# Patient Record
Sex: Female | Born: 1975
Health system: Southern US, Community
[De-identification: ages and names within clinical notes are randomized; demographics above are authoritative.]

## PROBLEM LIST (undated history)

## (undated) DIAGNOSIS — J189 Pneumonia, unspecified organism: Secondary | ICD-10-CM

## (undated) DIAGNOSIS — R05 Cough: Secondary | ICD-10-CM

## (undated) DIAGNOSIS — E876 Hypokalemia: Secondary | ICD-10-CM

## (undated) DIAGNOSIS — L309 Dermatitis, unspecified: Secondary | ICD-10-CM

## (undated) DIAGNOSIS — R059 Cough, unspecified: Secondary | ICD-10-CM

## (undated) DIAGNOSIS — D649 Anemia, unspecified: Secondary | ICD-10-CM

## (undated) DIAGNOSIS — I499 Cardiac arrhythmia, unspecified: Secondary | ICD-10-CM

## (undated) DIAGNOSIS — E559 Vitamin D deficiency, unspecified: Secondary | ICD-10-CM

## (undated) DIAGNOSIS — R748 Abnormal levels of other serum enzymes: Secondary | ICD-10-CM

## (undated) DIAGNOSIS — I10 Essential (primary) hypertension: Secondary | ICD-10-CM

## (undated) DIAGNOSIS — E669 Obesity, unspecified: Secondary | ICD-10-CM

## (undated) DIAGNOSIS — E119 Type 2 diabetes mellitus without complications: Secondary | ICD-10-CM

## (undated) HISTORY — DX: Abnormal levels of other serum enzymes: R74.8

## (undated) HISTORY — DX: Cough, unspecified: R05.9

## (undated) HISTORY — DX: Vitamin D deficiency, unspecified: E55.9

## (undated) HISTORY — DX: Cough: R05

## (undated) HISTORY — DX: Hypokalemia: E87.6

## (undated) HISTORY — DX: Cardiac arrhythmia, unspecified: I49.9

## (undated) HISTORY — DX: Obesity, unspecified: E66.9

## (undated) HISTORY — DX: Anemia, unspecified: D64.9

## (undated) HISTORY — DX: Type 2 diabetes mellitus without complications: E11.9

## (undated) HISTORY — DX: Essential (primary) hypertension: I10

## (undated) HISTORY — DX: Pneumonia, unspecified organism: J18.9

## (undated) HISTORY — DX: Hypocalcemia: E83.51

---

## 2009-09-12 ENCOUNTER — Emergency Department (HOSPITAL_COMMUNITY): Admission: EM | Admit: 2009-09-12 | Discharge: 2009-09-12 | Payer: Self-pay | Admitting: Emergency Medicine

## 2018-12-26 ENCOUNTER — Encounter (HOSPITAL_COMMUNITY): Payer: Self-pay | Admitting: Internal Medicine

## 2018-12-26 ENCOUNTER — Other Ambulatory Visit: Payer: Self-pay

## 2018-12-26 ENCOUNTER — Emergency Department (HOSPITAL_COMMUNITY): Payer: Medicaid Other

## 2018-12-26 ENCOUNTER — Inpatient Hospital Stay (HOSPITAL_COMMUNITY)
Admission: EM | Admit: 2018-12-26 | Discharge: 2018-12-31 | DRG: 871 | Disposition: A | Discharge status: Home / Self Care | Payer: Medicaid Other | Attending: Family Medicine | Admitting: Family Medicine

## 2018-12-26 DIAGNOSIS — D72825 Bandemia: Secondary | ICD-10-CM

## 2018-12-26 DIAGNOSIS — E871 Hypo-osmolality and hyponatremia: Secondary | ICD-10-CM

## 2018-12-26 DIAGNOSIS — E8770 Fluid overload, unspecified: Secondary | ICD-10-CM | POA: Diagnosis present

## 2018-12-26 DIAGNOSIS — J189 Pneumonia, unspecified organism: Secondary | ICD-10-CM | POA: Diagnosis present

## 2018-12-26 DIAGNOSIS — E876 Hypokalemia: Secondary | ICD-10-CM | POA: Diagnosis present

## 2018-12-26 DIAGNOSIS — A419 Sepsis, unspecified organism: Principal | ICD-10-CM | POA: Diagnosis present

## 2018-12-26 DIAGNOSIS — J181 Lobar pneumonia, unspecified organism: Secondary | ICD-10-CM

## 2018-12-26 DIAGNOSIS — D72829 Elevated white blood cell count, unspecified: Secondary | ICD-10-CM | POA: Diagnosis present

## 2018-12-26 DIAGNOSIS — Z20828 Contact with and (suspected) exposure to other viral communicable diseases: Secondary | ICD-10-CM | POA: Diagnosis present

## 2018-12-26 DIAGNOSIS — D649 Anemia, unspecified: Secondary | ICD-10-CM | POA: Diagnosis present

## 2018-12-26 DIAGNOSIS — J90 Pleural effusion, not elsewhere classified: Secondary | ICD-10-CM

## 2018-12-26 DIAGNOSIS — E860 Dehydration: Secondary | ICD-10-CM | POA: Diagnosis present

## 2018-12-26 DIAGNOSIS — R0682 Tachypnea, not elsewhere classified: Secondary | ICD-10-CM | POA: Diagnosis present

## 2018-12-26 LAB — CBC WITH DIFFERENTIAL/PLATELET
Abs Immature Granulocytes: 0.1 10*3/uL — ABNORMAL HIGH (ref 0.00–0.07)
Basophils Absolute: 0 10*3/uL (ref 0.0–0.1)
Basophils Relative: 0 %
Eosinophils Absolute: 0.1 10*3/uL (ref 0.0–0.5)
Eosinophils Relative: 1 %
HCT: 35 % — ABNORMAL LOW (ref 36.0–46.0)
Hemoglobin: 11.1 g/dL — ABNORMAL LOW (ref 12.0–15.0)
Immature Granulocytes: 1 %
Lymphocytes Relative: 11 %
Lymphs Abs: 1.3 10*3/uL (ref 0.7–4.0)
MCH: 26.7 pg (ref 26.0–34.0)
MCHC: 31.7 g/dL (ref 30.0–36.0)
MCV: 84.3 fL (ref 80.0–100.0)
Monocytes Absolute: 1.7 10*3/uL — ABNORMAL HIGH (ref 0.1–1.0)
Monocytes Relative: 15 %
Neutro Abs: 8.4 10*3/uL — ABNORMAL HIGH (ref 1.7–7.7)
Neutrophils Relative %: 72 %
Platelets: 325 10*3/uL (ref 150–400)
RBC: 4.15 MIL/uL (ref 3.87–5.11)
RDW: 12.3 % (ref 11.5–15.5)
WBC: 11.6 10*3/uL — ABNORMAL HIGH (ref 4.0–10.5)
nRBC: 0 % (ref 0.0–0.2)

## 2018-12-26 LAB — COMPREHENSIVE METABOLIC PANEL
ALT: 78 U/L — ABNORMAL HIGH (ref 0–44)
AST: 56 U/L — ABNORMAL HIGH (ref 15–41)
Albumin: 3.5 g/dL (ref 3.5–5.0)
Alkaline Phosphatase: 94 U/L (ref 38–126)
Anion gap: 10 (ref 5–15)
BUN: 7 mg/dL (ref 6–20)
CO2: 24 mmol/L (ref 22–32)
Calcium: 8.3 mg/dL — ABNORMAL LOW (ref 8.9–10.3)
Chloride: 99 mmol/L (ref 98–111)
Creatinine, Ser: 0.77 mg/dL (ref 0.44–1.00)
GFR calc Af Amer: >60 mL/min (ref >60)
GFR calc non Af Amer: >60 mL/min (ref >60)
Glucose, Bld: 136 mg/dL — ABNORMAL HIGH (ref 70–99)
Potassium: 3.4 mmol/L — ABNORMAL LOW (ref 3.5–5.1)
Sodium: 133 mmol/L — ABNORMAL LOW (ref 135–145)
Total Bilirubin: 4 mg/dL — ABNORMAL HIGH (ref 0.3–1.2)
Total Protein: 8.1 g/dL (ref 6.5–8.1)

## 2018-12-26 LAB — LACTIC ACID, PLASMA
Lactic Acid, Venous: 1.1 mmol/L (ref 0.5–1.9)
Lactic Acid, Venous: 1.4 mmol/L (ref 0.5–1.9)

## 2018-12-26 LAB — HCG, QUANTITATIVE, PREGNANCY: hCG, Beta Chain, Quant, S: <5 m[IU]/mL (ref <5)

## 2018-12-26 LAB — I-STAT BETA HCG BLOOD, ED (MC, WL, AP ONLY): I-stat hCG, quantitative: 12 m[IU]/mL — ABNORMAL HIGH (ref <5)

## 2018-12-26 LAB — SARS CORONAVIRUS 2 BY RT PCR (HOSPITAL ORDER, PERFORMED IN ~~LOC~~ HOSPITAL LAB): SARS Coronavirus 2: NEGATIVE

## 2018-12-26 MED ORDER — SODIUM CHLORIDE 0.9 % IV SOLN
500.0000 mg | INTRAVENOUS | Status: DC
  Administered 2018-12-27 – 2018-12-29 (×3): 500 mg via INTRAVENOUS
  Filled 2018-12-26 (×4): qty 500

## 2018-12-26 MED ORDER — SODIUM CHLORIDE 0.9 % IV SOLN
INTRAVENOUS | Status: DC
  Administered 2018-12-26 – 2018-12-28 (×3): via INTRAVENOUS

## 2018-12-26 MED ORDER — SODIUM CHLORIDE 0.9 % IV SOLN
500.0000 mg | Freq: Once | INTRAVENOUS | Status: AC
  Administered 2018-12-26: 500 mg via INTRAVENOUS
  Filled 2018-12-26: qty 500

## 2018-12-26 MED ORDER — ACETAMINOPHEN 500 MG PO TABS
1000.0000 mg | ORAL_TABLET | Freq: Once | ORAL | Status: AC
  Administered 2018-12-26: 1000 mg via ORAL
  Filled 2018-12-26: qty 2

## 2018-12-26 MED ORDER — ALBUTEROL SULFATE HFA 108 (90 BASE) MCG/ACT IN AERS
8.0000 | INHALATION_SPRAY | Freq: Once | RESPIRATORY_TRACT | Status: AC
  Administered 2018-12-26: 8 via RESPIRATORY_TRACT
  Filled 2018-12-26: qty 6.7

## 2018-12-26 MED ORDER — SODIUM CHLORIDE 0.9 % IV BOLUS
1000.0000 mL | Freq: Once | INTRAVENOUS | Status: AC
  Administered 2018-12-26: 14:00:00 1000 mL via INTRAVENOUS

## 2018-12-26 MED ORDER — ACETAMINOPHEN 500 MG PO TABS
1000.0000 mg | ORAL_TABLET | Freq: Four times a day (QID) | ORAL | Status: DC | PRN
  Administered 2018-12-26 – 2018-12-30 (×7): 1000 mg via ORAL
  Filled 2018-12-26 (×8): qty 2

## 2018-12-26 MED ORDER — BENZONATATE 100 MG PO CAPS
100.0000 mg | ORAL_CAPSULE | Freq: Three times a day (TID) | ORAL | Status: DC | PRN
  Administered 2018-12-26 – 2018-12-27 (×2): 100 mg via ORAL
  Filled 2018-12-26 (×2): qty 1

## 2018-12-26 MED ORDER — SODIUM CHLORIDE 0.9 % IV BOLUS
1000.0000 mL | Freq: Once | INTRAVENOUS | Status: AC
  Administered 2018-12-26: 12:00:00 1000 mL via INTRAVENOUS

## 2018-12-26 MED ORDER — SODIUM CHLORIDE 0.9 % IV SOLN
1.0000 g | INTRAVENOUS | Status: DC
  Administered 2018-12-27 – 2018-12-28 (×2): 1 g via INTRAVENOUS
  Filled 2018-12-26 (×2): qty 1

## 2018-12-26 MED ORDER — HYDRALAZINE HCL 20 MG/ML IJ SOLN
10.0000 mg | Freq: Four times a day (QID) | INTRAMUSCULAR | Status: DC | PRN
  Administered 2018-12-27: 10 mg via INTRAVENOUS
  Filled 2018-12-26 (×2): qty 1

## 2018-12-26 MED ORDER — ENOXAPARIN SODIUM 40 MG/0.4ML ~~LOC~~ SOLN
40.0000 mg | SUBCUTANEOUS | Status: DC
  Administered 2018-12-27 – 2018-12-29 (×3): 40 mg via SUBCUTANEOUS
  Filled 2018-12-26 (×4): qty 0.4

## 2018-12-26 MED ORDER — SODIUM CHLORIDE 0.9 % IV SOLN
2.0000 g | Freq: Once | INTRAVENOUS | Status: AC
  Administered 2018-12-26: 2 g via INTRAVENOUS
  Filled 2018-12-26: qty 20

## 2018-12-26 MED ORDER — DM-GUAIFENESIN ER 30-600 MG PO TB12
1.0000 | ORAL_TABLET | Freq: Two times a day (BID) | ORAL | Status: DC | PRN
  Administered 2018-12-26 – 2018-12-27 (×2): 1 via ORAL
  Filled 2018-12-26 (×2): qty 1

## 2018-12-26 NOTE — ED Provider Notes (Signed)
[facility]-EMERGENCY DEPT Provider Note   CSN: [id number] Arrival date & time: 12/26/18  1126    History   Chief Complaint Chief Complaint  Patient presents with  . Shortness of Breath  . Cough    HPI Valerie Moss is a 43 y.o. female.     HPI   43 yo F with PMHx below here with cough, fever, fatigue. Pt states that over there past week, she's had progressively worsening cough, fatigue, and weakness. She works as a Surveyor, [employer] and has continued to work during hte pandemic. Her sx started approx 6 days ago with mild cough followed by high fever, chills, mild nausea. Over the past two days, she's now developed SOB and difficulty walking 2/2 SOB. She's had mild production of sputum. Fevers have been as high as 103. She went to UC yesterday and was given tessalon, has been taking it w/o marked relief. No rash. No abd pain. No specific alleviating factors.  No past medical history on file.   PMHX: None, denies h/o asthma or COPD  There are no active problems to display for this patient.  PSHx: Non contributory   Non-smoker, no other drugs. No recent travel.   OB History   No obstetric history on file.      Home Medications    Prior to Admission medications   Medication Sig Start Date End Date Taking? Authorizing Provider  acetaminophen (TYLENOL) 500 MG tablet Take 1,000 mg by mouth every 6 (six) hours as needed for mild pain.   Yes [provider]  benzonatate (TESSALON) 100 MG capsule Take 100 mg by mouth 3 (three) times daily as needed for cough.   Yes [provider]  dextromethorphan-guaiFENesin (MUCINEX DM) 30-600 MG 12hr tablet Take 1 tablet by mouth 2 (two) times daily as needed for cough.   Yes [provider]    Family History No family history on file.  Social History Social History   Tobacco Use  . Smoking status: Not on file  Substance Use Topics  . Alcohol use: Not on file  . Drug use: Not on file     Allergies   Patient has no known allergies.   Review of Systems Review of Systems  Constitutional: Positive for chills, fatigue and fever.  HENT: Positive for congestion. Negative for rhinorrhea.   Eyes: Negative for visual disturbance.  Respiratory: Positive for cough and shortness of breath. Negative for wheezing.   Cardiovascular: Negative for chest pain and leg swelling.  Gastrointestinal: Negative for abdominal pain, diarrhea, nausea and vomiting.  Genitourinary: Negative for dysuria and flank pain.  Musculoskeletal: Negative for neck pain and neck stiffness.  Skin: Negative for rash and wound.  Allergic/Immunologic: Negative for immunocompromised state.  Neurological: Positive for weakness. Negative for syncope and headaches.  All other systems reviewed and are negative.    Physical Exam Updated Vital Signs BP (!) 147/110   Pulse (!) 102   Temp (!) 103.2 F (39.6 C) (Oral)   Resp (!) 30   Ht 5\' 7"  (1.702 m)   Wt 128.8 kg   LMP 12/25/2018 (Approximate) Comment: IUD  SpO2 95%   BMI 44.48 kg/m   Physical Exam Vitals signs and nursing note reviewed.  Constitutional:      General: She is not in acute distress.    Appearance: She is well-developed. She is ill-appearing.  HENT:     Head: Normocephalic and atraumatic.     Comments: Dry MM Eyes:  Conjunctiva/sclera: Conjunctivae normal.  Neck:     Musculoskeletal: Neck supple.  Cardiovascular:     Rate and Rhythm: Regular rhythm. Tachycardia present.     Heart sounds: Normal heart sounds. No murmur. No friction rub.  Pulmonary:     Effort: Pulmonary effort is normal. No respiratory distress.     Breath sounds: Wheezing and rhonchi present. No rales.  Abdominal:     General: There is no distension.     Palpations: Abdomen is soft.     Tenderness: There is no abdominal tenderness.  Skin:    General: Skin is warm.     Capillary Refill: Capillary refill takes less than 2 seconds.  Neurological:     Mental  Status: She is alert and oriented to person, place, and time.     Motor: No abnormal muscle tone.      ED Treatments / Results  Labs (all labs ordered are listed, but only abnormal results are displayed) Labs Reviewed  CBC WITH DIFFERENTIAL/PLATELET - Abnormal; Notable for the following components:      Result Value   WBC 11.6 (*)    Hemoglobin 11.1 (*)    HCT 35.0 (*)    Neutro Abs 8.4 (*)    Monocytes Absolute 1.7 (*)    Abs Immature Granulocytes 0.10 (*)    All other components within normal limits  COMPREHENSIVE METABOLIC PANEL - Abnormal; Notable for the following components:   Sodium 133 (*)    Potassium 3.4 (*)    Glucose, Bld 136 (*)    Calcium 8.3 (*)    AST 56 (*)    ALT 78 (*)    Total Bilirubin 4.0 (*)    All other components within normal limits  I-STAT BETA HCG BLOOD, ED ([facility], [facility], [facility] ONLY) - Abnormal; Notable for the following components:   I-stat hCG, quantitative 12.0 (*)    All other components within normal limits  SARS CORONAVIRUS 2 (HOSPITAL ORDER, PERFORMED IN [facility])  CULTURE, BLOOD (ROUTINE X 2)  CULTURE, BLOOD (ROUTINE X 2)  LACTIC ACID, PLASMA  HCG, QUANTITATIVE, PREGNANCY  LACTIC ACID, PLASMA    EKG EKG Interpretation  Date/Time:  Saturday December 26 2018 11:52:06 EDT Ventricular Rate:  128 PR Interval:    QRS Duration: 76 QT Interval:  290 QTC Calculation: 424 R Axis:   24 Text Interpretation:  Sinus tachycardia Anterior infarct, old Baseline wander in lead(s) II III aVF No old tracing to compare Confirmed by Shaune Pollack [phone]) on 12/26/2018 1:14:40 PM   Radiology Dg Chest Portable 1 View  Result Date: 12/26/2018 CLINICAL DATA:  Shortness of breath.  Cough for 6 days. EXAM: PORTABLE CHEST 1 VIEW COMPARISON:  None. FINDINGS: The study is limited due to low volume portable technique. No pneumothorax. Mild opacity in the medial right lung base. Lungs are otherwise clear. Suspected cardiomegaly, poorly evaluated due to  technique. No other acute abnormalities. IMPRESSION: 1. The study is limited due to low volume portable technique. Mild opacity in the medial right lung base is not excluded. Recommend a better volume PA and lateral chest x-ray. 2. No other acute abnormalities. Electronically Signed   By: Gerome Sam III M.D   On: 12/26/2018 12:49    Procedures .Critical Care Performed by: Shaune Pollack, MD Authorized by: Shaune Pollack, MD   Critical care provider statement:    Critical care time (minutes):  35   Critical care time was exclusive of:  Separately billable procedures and treating other  patients and teaching time   Critical care was necessary to treat or prevent imminent or life-threatening deterioration of the following conditions:  Cardiac failure, circulatory failure and respiratory failure   Critical care was time spent personally by me on the following activities:  Development of treatment plan with patient or surrogate, discussions with consultants, evaluation of patient's response to treatment, examination of patient, obtaining history from patient or surrogate, ordering and performing treatments and interventions, ordering and review of laboratory studies, ordering and review of radiographic studies, pulse oximetry, re-evaluation of patient's condition and review of old charts   I assumed direction of critical care for this patient from another provider in my specialty: no     (including critical care time)  Medications Ordered in ED Medications  acetaminophen (TYLENOL) tablet 1,000 mg (1,000 mg Oral Given 12/26/18 1202)  sodium chloride 0.9 % bolus 1,000 mL (0 mLs Intravenous Stopped 12/26/18 1341)  albuterol (VENTOLIN HFA) 108 (90 Base) MCG/ACT inhaler 8 puff (8 puffs Inhalation Given 12/26/18 1203)  sodium chloride 0.9 % bolus 1,000 mL (1,000 mLs Intravenous New Bag/Given 12/26/18 1358)  cefTRIAXone (ROCEPHIN) 2 g in sodium chloride 0.9 % 100 mL IVPB (2 g Intravenous New Bag/Given  12/26/18 1358)  azithromycin (ZITHROMAX) 500 mg in sodium chloride 0.9 % 250 mL IVPB (500 mg Intravenous New Bag/Given 12/26/18 1400)     Initial Impression / Assessment and Plan / ED Course  I have reviewed the triage vital signs and the nursing notes.  Pertinent labs & imaging results that were available during my care of the patient were reviewed by me and considered in my medical decision making (see chart for details).        43 yo F here with increased WOB, cough, SOB. On exam, pt febrile, tachycardic, tachypneic with increased WOB. CXR is concerning for PNA and labs show leukocytosis, mild transaminitis which I suspect is viral in etiology. Interestingly, COVID negative though I still have a high suspicion for this. Pt given fluids, APAP, Rocephin/Azithro and will admit.  Final Clinical Impressions(s) / ED Diagnoses   Final diagnoses:  Sepsis due to pneumonia [facility])    ED Discharge Orders    None       Shaune Pollack, MD 12/26/18 1514

## 2018-12-26 NOTE — H&P (Signed)
History and Physical   Valerie RedRachel Geddes BMW:[id number] DOB: 04-02-1976 DOA: 12/26/2018  Referring MD/NP/PA: Dr. Erma HeritageIsaacs  PCP: Patient, No Pcp Per   Outpatient Specialists: None  Patient coming from: Home  Chief Complaint: Fever cough or shortness of breath  HPI: Valerie Moss is a 43 y.o. female with medical history significant of no significant past medical history who works at [employer] and has had possible sick contacts presenting with shortness of breath cough and fever.  Symptoms have been going on for within the past week.  Associated with malaise.  Patient was seen in the ER with a temperature 103.  She has had some mild nausea but no vomiting.  She has had some productive cough.  Denied any diarrhea.  Patient was seen in the ER evaluated.  Rapid test for the COVID-19 was negative.  She is has clinical concern however for the virus including symptoms.  No leukopenia nor thrombocytopenia.  At this point she is considered low risk for COVID-19 but chest x-ray showed pneumonia.  Patient is being admitted for treatment of community-acquired pneumonia with low risk COVID-19 infection..  ED Course: Temperature 103.2 blood pressure 172/108 pulse 136, respiratory of 33 oxygen sat 93% on room air.  White count is 11.6 hemoglobin 11.1 platelets 325.  Sodium 133 potassium 3.4 chloride 99 CO2 24 with a BUN 7 creatinine 0.77.  Calcium is 8.3.  Glucose is 136 and lactic acid 1.1.  Chest x-ray showed right basal infiltrate.  EKG shows sinus tach.  Patient is being admitted with pneumonia again rapid COVID-19 testing was negative.  Review of Systems: As per HPI otherwise 10 point review of systems negative.    History reviewed. No pertinent past medical history.  History reviewed. No pertinent surgical history.   has no history on file for tobacco, alcohol, and drug.  No Known Allergies  History reviewed. No pertinent family history.   Prior to Admission medications   Medication Sig Start  Date End Date Taking? Authorizing Provider  acetaminophen (TYLENOL) 500 MG tablet Take 1,000 mg by mouth every 6 (six) hours as needed for mild pain.   Yes [provider]  benzonatate (TESSALON) 100 MG capsule Take 100 mg by mouth 3 (three) times daily as needed for cough.   Yes [provider]  dextromethorphan-guaiFENesin (MUCINEX DM) 30-600 MG 12hr tablet Take 1 tablet by mouth 2 (two) times daily as needed for cough.   Yes [provider]    Physical Exam: Vitals:   12/26/18 1515 12/26/18 1530 12/26/18 1600 12/26/18 1625  BP:  (!) 165/152 (!) 172/108   Pulse: 100 (!) 101 (!) 106   Resp: (!) 33 (!) 24 (!) 25   Temp:    (!) 102 F (38.9 C)  TempSrc:    Oral  SpO2: 95% 98% 99%   Weight:      Height:          Constitutional: NAD, calm, acutely ill looking Vitals:   12/26/18 1515 12/26/18 1530 12/26/18 1600 12/26/18 1625  BP:  (!) 165/152 (!) 172/108   Pulse: 100 (!) 101 (!) 106   Resp: (!) 33 (!) 24 (!) 25   Temp:    (!) 102 F (38.9 C)  TempSrc:    Oral  SpO2: 95% 98% 99%   Weight:      Height:       Eyes: PERRL, lids and conjunctivae normal ENMT: Mucous membranes are moist. Posterior pharynx clear of any exudate or lesions.Normal dentition.  Neck: normal, supple, no masses, no thyromegaly Respiratory: Decreased air entry bilaterally, no wheezing, bilateral basal crackles.  Increased respiratory effort. No accessory muscle use.  Cardiovascular: Sinus tachycardia no murmurs / rubs / gallops. No extremity edema. 2+ pedal pulses. No carotid bruits.  Abdomen: no tenderness, no masses palpated. No hepatosplenomegaly. Bowel sounds positive.  Musculoskeletal: no clubbing / cyanosis. No joint deformity upper and lower extremities. Good ROM, no contractures. Normal muscle tone.  Skin: no rashes, lesions, ulcers. No induration Neurologic: CN 2-12 grossly intact. Sensation intact, DTR normal. Strength 5/5 in all 4.  Psychiatric: Normal judgment and  insight. Alert and oriented x 3. Normal mood.     Labs on Admission: I have personally reviewed following labs and imaging studies  CBC: Recent Labs  Lab 12/26/18 1154  WBC 11.6*  NEUTROABS 8.4*  HGB 11.1*  HCT 35.0*  MCV 84.3  PLT 325   Basic Metabolic Panel: Recent Labs  Lab 12/26/18 1154  NA 133*  K 3.4*  CL 99  CO2 24  GLUCOSE 136*  BUN 7  CREATININE 0.77  CALCIUM 8.3*   GFR: Estimated Creatinine Clearance: 129.3 mL/min (by C-G formula based on SCr of 0.77 mg/dL). Liver Function Tests: Recent Labs  Lab 12/26/18 1154  AST 56*  ALT 78*  ALKPHOS 94  BILITOT 4.0*  PROT 8.1  ALBUMIN 3.5   No results for input(s): LIPASE, AMYLASE in the last 168 hours. No results for input(s): AMMONIA in the last 168 hours. Coagulation Profile: No results for input(s): INR, PROTIME in the last 168 hours. Cardiac Enzymes: No results for input(s): CKTOTAL, CKMB, CKMBINDEX, TROPONINI in the last 168 hours. BNP (last 3 results) No results for input(s): PROBNP in the last 8760 hours. HbA1C: No results for input(s): HGBA1C in the last 72 hours. CBG: No results for input(s): GLUCAP in the last 168 hours. Lipid Profile: No results for input(s): CHOL, HDL, LDLCALC, TRIG, CHOLHDL, LDLDIRECT in the last 72 hours. Thyroid Function Tests: No results for input(s): TSH, T4TOTAL, FREET4, T3FREE, THYROIDAB in the last 72 hours. Anemia Panel: No results for input(s): VITAMINB12, FOLATE, FERRITIN, TIBC, IRON, RETICCTPCT in the last 72 hours. Urine analysis: No results found for: COLORURINE, APPEARANCEUR, LABSPEC, PHURINE, GLUCOSEU, HGBUR, BILIRUBINUR, KETONESUR, PROTEINUR, UROBILINOGEN, NITRITE, LEUKOCYTESUR Sepsis Labs: (procalcitonin:4,lacticidven:4) ) Recent Results (from the past 240 hour(s))  SARS Coronavirus 2 [facility] order, Performed in [facility] hospital lab)     Status: None   Collection Time: 12/26/18 12:46 PM  Result Value Ref Range Status   SARS Coronavirus 2  NEGATIVE NEGATIVE Final    Comment: (NOTE) If result is NEGATIVE SARS-CoV-2 target nucleic acids are NOT DETECTED. The SARS-CoV-2 RNA is generally detectable in upper and lower  respiratory specimens during the acute phase of infection. The lowest  concentration of SARS-CoV-2 viral copies this assay can detect is 250  copies / mL. A negative result does not preclude SARS-CoV-2 infection  and should not be used as the sole basis for treatment or other  patient management decisions.  A negative result may occur with  improper specimen collection / handling, submission of specimen other  than nasopharyngeal swab, presence of viral mutation(s) within the  areas targeted by this assay, and inadequate number of viral copies  (<250 copies / mL). A negative result must be combined with clinical  observations, patient history, and epidemiological information. If result is POSITIVE SARS-CoV-2 target nucleic acids are DETECTED. The SARS-CoV-2 RNA is generally detectable in upper and lower  respiratory  specimens dur ing the acute phase of infection.  Positive  results are indicative of active infection with SARS-CoV-2.  Clinical  correlation with patient history and other diagnostic information is  necessary to determine patient infection status.  Positive results do  not rule out bacterial infection or co-infection with other viruses. If result is PRESUMPTIVE POSTIVE SARS-CoV-2 nucleic acids MAY BE PRESENT.   A presumptive positive result was obtained on the submitted specimen  and confirmed on repeat testing.  While 2019 novel coronavirus  (SARS-CoV-2) nucleic acids may be present in the submitted sample  additional confirmatory testing may be necessary for epidemiological  and / or clinical management purposes  to differentiate between  SARS-CoV-2 and other Sarbecovirus currently known to infect humans.  If clinically indicated additional testing with an alternate test  methodology [phone])  is advised. The SARS-CoV-2 RNA is generally  detectable in upper and lower respiratory sp ecimens during the acute  phase of infection. The expected result is Negative. Fact Sheet for Patients:  BoilerBrush.com.cy Fact Sheet for Healthcare Providers: https://pope.com/ This test is not yet approved or cleared by the [place] FDA and has been authorized for detection and/or diagnosis of SARS-CoV-2 by FDA under an Emergency Use Authorization (EUA).  This EUA will remain in effect (meaning this test can be used) for the duration of the COVID-19 declaration under Section 564(b)(1) of the Act, 21 U.S.C. section 360bbb-3(b)(1), unless the authorization is terminated or revoked sooner. Performed at [facility], [address], [place], [place] [address]      Radiological Exams on Admission: Dg Chest Portable 1 View  Result Date: 12/26/2018 CLINICAL DATA:  Shortness of breath.  Cough for 6 days. EXAM: PORTABLE CHEST 1 VIEW COMPARISON:  None. FINDINGS: The study is limited due to low volume portable technique. No pneumothorax. Mild opacity in the medial right lung base. Lungs are otherwise clear. Suspected cardiomegaly, poorly evaluated due to technique. No other acute abnormalities. IMPRESSION: 1. The study is limited due to low volume portable technique. Mild opacity in the medial right lung base is not excluded. Recommend a better volume PA and lateral chest x-ray. 2. No other acute abnormalities. Electronically Signed   By: Gerome Sam III M.D   On: 12/26/2018 12:49    EKG: Independently reviewed.  It shows sinus tachycardia with a rate of 120s no significant ST changes.  Assessment/Plan Principal Problem:   Pneumonia Active Problems:   Tachypnea   Leucocytosis   Hypokalemia   Hyponatremia     #1 community-acquired pneumonia: Patient is going to be admitted for committee acquired pneumonia.  Initiate IV  Rocephin and Zithromax.  Monitor blood culture and sputum culture results.  Patient will have a repeat COVID-19 testing tomorrow.  Have discussed with infectious disease.  The test is pretty sensitive and is negative however clinically she is 16 the profile for possible COVID-19.  Recommendation is to repeat the test 24 hours later at least.  We will check it again in the morning.  Patient is low risk at this point.  #2 tachypnea: Most likely due to pneumonia.  Continue supportive care.  #3 hyponatremia: Continue normal saline resuscitation.  #4 hypokalemia: Continue potassium dilatation.  #5 leukocytosis: Secondary to pneumonia.  Monitor white count.  #6 morbid obesity: Dietary counseling.   DVT prophylaxis: Lovenox Code Status: Full code Family Communication: No family at bedside discussed with patient Disposition Plan: Home Consults called: None Admission status: Inpatient   Severity of Illness: The appropriate  patient status for this patient is INPATIENT. Inpatient status is judged to be reasonable and necessary in order to provide the required intensity of service to ensure the patient's safety. The patient's presenting symptoms, physical exam findings, and initial radiographic and laboratory data in the context of their chronic comorbidities is felt to place them at high risk for further clinical deterioration. Furthermore, it is not anticipated that the patient will be medically stable for discharge from the hospital within 2 midnights of admission. The following factors support the patient status of inpatient.   " The patient's presenting symptoms include fever cough or shortness of breath. " The worrisome physical exam findings include tachypnea with decreased air entry bilaterally. " The initial radiographic and laboratory data are worrisome because of chest x-ray showing right basilar infiltrate. " The chronic co-morbidities include no significant past medical history.   * I  certify that at the point of admission it is my clinical judgment that the patient will require inpatient hospital care spanning beyond 2 midnights from the point of admission due to high intensity of service, high risk for further deterioration and high frequency of surveillance required.Lonia Blood MD [facility] Pager [phone]  If 7PM-7AM, please contact night-coverage [email or web address] Password [facility]  12/26/2018, 4:54 PM

## 2018-12-26 NOTE — ED Triage Notes (Signed)
Pt BIBA from home with c/o SHOB, cough x 6 days. Pt having fever at UC (103.0 F).  C/o neck pain from coughing, headache as well 7/10.   Denies abd pain.  C/o nausea on arrival to ED.    AOx4.

## 2018-12-26 NOTE — ED Notes (Signed)
Bed: MG86 Expected date:  Expected time:  Means of arrival:  Comments: 43 yo cough, SOB, fever

## 2018-12-27 ENCOUNTER — Inpatient Hospital Stay (HOSPITAL_COMMUNITY): Payer: Medicaid Other

## 2018-12-27 DIAGNOSIS — J189 Pneumonia, unspecified organism: Secondary | ICD-10-CM

## 2018-12-27 LAB — COMPREHENSIVE METABOLIC PANEL
ALT: 69 U/L — ABNORMAL HIGH (ref 0–44)
AST: 55 U/L — ABNORMAL HIGH (ref 15–41)
Albumin: 2.9 g/dL — ABNORMAL LOW (ref 3.5–5.0)
Alkaline Phosphatase: 87 U/L (ref 38–126)
Anion gap: 10 (ref 5–15)
BUN: 6 mg/dL (ref 6–20)
CO2: 22 mmol/L (ref 22–32)
Calcium: 7.6 mg/dL — ABNORMAL LOW (ref 8.9–10.3)
Chloride: 104 mmol/L (ref 98–111)
Creatinine, Ser: 0.66 mg/dL (ref 0.44–1.00)
GFR calc Af Amer: >60 mL/min (ref >60)
GFR calc non Af Amer: >60 mL/min (ref >60)
Glucose, Bld: 141 mg/dL — ABNORMAL HIGH (ref 70–99)
Potassium: 3.3 mmol/L — ABNORMAL LOW (ref 3.5–5.1)
Sodium: 136 mmol/L (ref 135–145)
Total Bilirubin: 3.2 mg/dL — ABNORMAL HIGH (ref 0.3–1.2)
Total Protein: 6.9 g/dL (ref 6.5–8.1)

## 2018-12-27 LAB — CBC WITH DIFFERENTIAL/PLATELET
Abs Immature Granulocytes: 0.08 10*3/uL — ABNORMAL HIGH (ref 0.00–0.07)
Basophils Absolute: 0 10*3/uL (ref 0.0–0.1)
Basophils Relative: 0 %
Eosinophils Absolute: 0 10*3/uL (ref 0.0–0.5)
Eosinophils Relative: 0 %
HCT: 34.6 % — ABNORMAL LOW (ref 36.0–46.0)
Hemoglobin: 10.4 g/dL — ABNORMAL LOW (ref 12.0–15.0)
Immature Granulocytes: 1 %
Lymphocytes Relative: 12 %
Lymphs Abs: 1.3 10*3/uL (ref 0.7–4.0)
MCH: 26.2 pg (ref 26.0–34.0)
MCHC: 30.1 g/dL (ref 30.0–36.0)
MCV: 87.2 fL (ref 80.0–100.0)
Monocytes Absolute: 1.5 10*3/uL — ABNORMAL HIGH (ref 0.1–1.0)
Monocytes Relative: 14 %
Neutro Abs: 7.9 10*3/uL — ABNORMAL HIGH (ref 1.7–7.7)
Neutrophils Relative %: 73 %
Platelets: 291 10*3/uL (ref 150–400)
RBC: 3.97 MIL/uL (ref 3.87–5.11)
RDW: 12.8 % (ref 11.5–15.5)
WBC: 10.7 10*3/uL — ABNORMAL HIGH (ref 4.0–10.5)
nRBC: 0 % (ref 0.0–0.2)

## 2018-12-27 LAB — HIV ANTIBODY (ROUTINE TESTING W REFLEX): HIV Screen 4th Generation wRfx: NONREACTIVE

## 2018-12-27 LAB — SARS CORONAVIRUS 2 BY RT PCR (HOSPITAL ORDER, PERFORMED IN ~~LOC~~ HOSPITAL LAB): SARS Coronavirus 2: NEGATIVE

## 2018-12-27 MED ORDER — POTASSIUM CHLORIDE CRYS ER 20 MEQ PO TBCR
40.0000 meq | EXTENDED_RELEASE_TABLET | Freq: Once | ORAL | Status: AC
  Administered 2018-12-27: 40 meq via ORAL
  Filled 2018-12-27: qty 2

## 2018-12-27 MED ORDER — ZOLPIDEM TARTRATE 5 MG PO TABS
5.0000 mg | ORAL_TABLET | Freq: Once | ORAL | Status: AC
  Administered 2018-12-27: 5 mg via ORAL
  Filled 2018-12-27: qty 1

## 2018-12-27 MED ORDER — DM-GUAIFENESIN ER 30-600 MG PO TB12
1.0000 | ORAL_TABLET | Freq: Two times a day (BID) | ORAL | Status: DC | PRN
  Administered 2018-12-28: 1 via ORAL
  Filled 2018-12-27: qty 1

## 2018-12-27 MED ORDER — SALINE SPRAY 0.65 % NA SOLN
1.0000 | NASAL | Status: DC | PRN
  Filled 2018-12-27: qty 44

## 2018-12-27 MED ORDER — BENZONATATE 100 MG PO CAPS
100.0000 mg | ORAL_CAPSULE | Freq: Three times a day (TID) | ORAL | Status: DC | PRN
  Administered 2018-12-27 – 2018-12-28 (×3): 100 mg via ORAL
  Filled 2018-12-27 (×3): qty 1

## 2018-12-27 NOTE — Progress Notes (Signed)
[facility]  PROGRESS NOTE  Valerie Moss MVH:[id number] DOB: 23-Nov-1975 DOA: 12/26/2018 PCP: Patient, No Pcp Per   Brief HPI:   43 year old female with no significant medical problems who works at [employer] came to ED with shortness of breath, cough and fever.  COVID-19 test was negative in the ED, chest x-ray showed pneumonia.  Repeat COVID-19 test today also turned out to be negative.    Subjective   Patient seen and examined, continues to have cough and yellow phlegm.   Assessment/Plan:     1. Community-acquired pneumonia-patient admitted with community-acquired pneumonia, started on Rocephin and Zithromax.  COVID-19 test is negative.  Lactic acid 1.1, strep pneumo urinary antigen is pending, sputum culture is pending.  Will repeat chest x-ray two-view, as yesterday chest x-ray was portable with poor film quality.  Radiologist recommended to repeat 2 view.  Continue contact and droplet precautions.  2. Mild hyponatremia-resolved, patient presented with mild hyponatremia with sodium 133, improved to  136 with IV fluids.  Likely from dehydration and poor p.o. intake.  Pneumonia itself can cause SIADH.  Will closely monitor patient's serum sodium in the hospital.  3. Hypokalemia-potassium is 3.3, will replace potassium and check BMP in a.m.      CBC: Recent Labs  Lab 12/26/18 1154 12/27/18 0346  WBC 11.6* 10.7*  NEUTROABS 8.4* 7.9*  HGB 11.1* 10.4*  HCT 35.0* 34.6*  MCV 84.3 87.2  PLT 325 291    Basic Metabolic Panel: Recent Labs  Lab 12/26/18 1154 12/27/18 0346  NA 133* 136  K 3.4* 3.3*  CL 99 104  CO2 24 22  GLUCOSE 136* 141*  BUN 7 6  CREATININE 0.77 0.66  CALCIUM 8.3* 7.6*     DVT prophylaxis: Lovenox  Code Status: Full code  Family Communication: No family at bedside  Disposition Plan: likely home when medically ready for discharge     Consultants:    Procedures:     Antibiotics:   Anti-infectives (From admission, onward)    Start     Dose/Rate Route Frequency Ordered Stop   12/27/18 1000  cefTRIAXone (ROCEPHIN) 1 g in sodium chloride 0.9 % 100 mL IVPB     1 g 200 mL/hr over 30 Minutes Intravenous Every 24 hours 12/26/18 1832 01/02/19 0959   12/27/18 1000  azithromycin (ZITHROMAX) 500 mg in sodium chloride 0.9 % 250 mL IVPB     500 mg 250 mL/hr over 60 Minutes Intravenous Every 24 hours 12/26/18 1832 01/02/19 0959   12/26/18 1300  cefTRIAXone (ROCEPHIN) 2 g in sodium chloride 0.9 % 100 mL IVPB     2 g 200 mL/hr over 30 Minutes Intravenous  Once 12/26/18 1255 12/26/18 1618   12/26/18 1300  azithromycin (ZITHROMAX) 500 mg in sodium chloride 0.9 % 250 mL IVPB     500 mg 250 mL/hr over 60 Minutes Intravenous  Once 12/26/18 1255 12/26/18 1618       Objective   Vitals:   12/26/18 2000 12/26/18 2100 12/27/18 0256 12/27/18 0600  BP:  (!) 146/76  (!) 151/80  Pulse:  (!) 106 99 96  Resp:  (!) 26 (!) 24 (!) 24  Temp: (!) 103.2 F (39.6 C) (!) 103.1 F (39.5 C) 99.9 F (37.7 C) (!) 100.9 F (38.3 C)  TempSrc: Oral Oral Oral Oral  SpO2:  94% 98% 96%  Weight:      Height:        Intake/Output Summary (Last 24 hours) at 12/27/2018 1207 Last data filed  at 12/27/2018 1000 Gross per 24 hour  Intake 3657.5 ml  Output -  Net 3657.5 ml   Filed Weights   12/26/18 1153 12/26/18 1831  Weight: 128.8 kg 135.1 kg     Physical Examination:    General: Appears in no acute distress  Cardiovascular: S1-S2, regular, no murmur auscultated  Respiratory: Decreased breath sounds bilaterally at lung bases  Abdomen: Abdomen is soft, nontender, no organomegaly  Extremities: No edema of the lower extremities  Neurologic: Alert, oriented x3, no focal deficit noted.     Data Reviewed: I have personally reviewed following labs and imaging studies   Recent Results (from the past 240 hour(s))  Blood culture (routine x 2)     Status: None (Preliminary result)   Collection Time: 12/26/18 12:46 PM  Result Value  Ref Range Status   Specimen Description   Final    LEFT ANTECUBITAL Performed at [facility], [address] [place], [place] [address]    Special Requests   Final    BOTTLES DRAWN AEROBIC AND ANAEROBIC Blood Culture adequate volume Performed at [facility], [address] [place], [place] [address]    Culture   Final    NO GROWTH < 24 HOURS Performed at [facility], [address], [place], [place] [address]    Report Status PENDING  Incomplete  SARS Coronavirus 2 [facility] order, Performed in [facility] hospital lab)     Status: None   Collection Time: 12/26/18 12:46 PM  Result Value Ref Range Status   SARS Coronavirus 2 NEGATIVE NEGATIVE Final    Comment: (NOTE) If result is NEGATIVE SARS-CoV-2 target nucleic acids are NOT DETECTED. The SARS-CoV-2 RNA is generally detectable in upper and lower  respiratory specimens during the acute phase of infection. The lowest  concentration of SARS-CoV-2 viral copies this assay can detect is 250  copies / mL. A negative result does not preclude SARS-CoV-2 infection  and should not be used as the sole basis for treatment or other  patient management decisions.  A negative result may occur with  improper specimen collection / handling, submission of specimen other  than nasopharyngeal swab, presence of viral mutation(s) within the  areas targeted by this assay, and inadequate number of viral copies  (<250 copies / mL). A negative result must be combined with clinical  observations, patient history, and epidemiological information. If result is POSITIVE SARS-CoV-2 target nucleic acids are DETECTED. The SARS-CoV-2 RNA is generally detectable in upper and lower  respiratory specimens dur ing the acute phase of infection.  Positive  results are indicative of active infection with SARS-CoV-2.  Clinical  correlation with patient history and other diagnostic information is  necessary to determine  patient infection status.  Positive results do  not rule out bacterial infection or co-infection with other viruses. If result is PRESUMPTIVE POSTIVE SARS-CoV-2 nucleic acids MAY BE PRESENT.   A presumptive positive result was obtained on the submitted specimen  and confirmed on repeat testing.  While 2019 novel coronavirus  (SARS-CoV-2) nucleic acids may be present in the submitted sample  additional confirmatory testing may be necessary for epidemiological  and / or clinical management purposes  to differentiate between  SARS-CoV-2 and other Sarbecovirus currently known to infect humans.  If clinically indicated additional testing with an alternate test  methodology [phone]) is advised. The SARS-CoV-2 RNA is generally  detectable in upper and lower respiratory sp ecimens during the acute  phase of infection.  The expected result is Negative. Fact Sheet for Patients:  BoilerBrush.com.cy Fact Sheet for Healthcare Providers: https://pope.com/ This test is not yet approved or cleared by the [place] FDA and has been authorized for detection and/or diagnosis of SARS-CoV-2 by FDA under an Emergency Use Authorization (EUA).  This EUA will remain in effect (meaning this test can be used) for the duration of the COVID-19 declaration under Section 564(b)(1) of the Act, 21 U.S.C. section 360bbb-3(b)(1), unless the authorization is terminated or revoked sooner. Performed at [facility], [address], [place], [place] [address]   Blood culture (routine x 2)     Status: None (Preliminary result)   Collection Time: 12/26/18 12:51 PM  Result Value Ref Range Status   Specimen Description   Final    LEFT ANTECUBITAL Performed at [facility], [address] [place], [place] [address]    Special Requests   Final    BOTTLES DRAWN AEROBIC AND ANAEROBIC Blood Culture adequate volume Performed at [facility], [address], [place], [place] [address]    Culture   Final    NO GROWTH < 24 HOURS Performed at [facility], [address], [place], [place] [address]    Report Status PENDING  Incomplete  SARS Coronavirus 2 [facility] order, Performed in [facility] hospital lab)     Status: None   Collection Time: 12/27/18  6:09 AM  Result Value Ref Range Status   SARS Coronavirus 2 NEGATIVE NEGATIVE Final    Comment: (NOTE) If result is NEGATIVE SARS-CoV-2 target nucleic acids are NOT DETECTED. The SARS-CoV-2 RNA is generally detectable in upper and lower  respiratory specimens during the acute phase of infection. The lowest  concentration of SARS-CoV-2 viral copies this assay can detect is 250  copies / mL. A negative result does not preclude SARS-CoV-2 infection  and should not be used as the sole basis for treatment or other  patient management decisions.  A negative result may occur with  improper specimen collection / handling, submission of specimen other  than nasopharyngeal swab, presence of viral mutation(s) within the  areas targeted by this assay, and inadequate number of viral copies  (<250 copies / mL). A negative result must be combined with clinical  observations, patient history, and epidemiological information. If result is POSITIVE SARS-CoV-2 target nucleic acids are DETECTED. The SARS-CoV-2 RNA is generally detectable in upper and lower  respiratory specimens dur ing the acute phase of infection.  Positive  results are indicative of active infection with SARS-CoV-2.  Clinical  correlation with patient history and other diagnostic information is  necessary to determine patient infection status.  Positive results do  not rule out bacterial infection or co-infection with other viruses. If result is PRESUMPTIVE POSTIVE SARS-CoV-2 nucleic acids MAY BE PRESENT.   A presumptive positive result was obtained on the submitted specimen  and confirmed  on repeat testing.  While 2019 novel coronavirus  (SARS-CoV-2) nucleic acids may be present in the submitted sample  additional confirmatory testing may be necessary for epidemiological  and / or clinical management purposes  to differentiate between  SARS-CoV-2 and other Sarbecovirus currently known to infect humans.  If clinically indicated additional testing with an alternate test  methodology [phone] is advised. The SARS-CoV-2 RNA is generally  detectable in upper and lower respiratory sp ecimens during the acute  phase of infection. The expected result is Negative. Fact Sheet for Patients:  BoilerBrush.com.cy Fact Sheet  for Healthcare Providers: https://pope.com/https://www.fda.gov/media/136313/download This test is not yet approved or cleared by the [place] FDA and has been authorized for detection and/or diagnosis of SARS-CoV-2 by FDA under an Emergency Use Authorization (EUA).  This EUA will remain in effect (meaning this test can be used) for the duration of the COVID-19 declaration under Section 564(b)(1) of the Act, 21 U.S.C. section 360bbb-3(b)(1), unless the authorization is terminated or revoked sooner. Performed at [facility], [address] [place], [place] [address]      Liver Function Tests: Recent Labs  Lab 12/26/18 1154 12/27/18 0346  AST 56* 55*  ALT 78* 69*  ALKPHOS 94 87  BILITOT 4.0* 3.2*  PROT 8.1 6.9  ALBUMIN 3.5 2.9*   No results for input(s): LIPASE, AMYLASE in the last 168 hours. No results for input(s): AMMONIA in the last 168 hours.  Cardiac Enzymes: No results for input(s): CKTOTAL, CKMB, CKMBINDEX, TROPONINI in the last 168 hours. BNP (last 3 results) No results for input(s): BNP in the last 8760 hours.  ProBNP (last 3 results) No results for input(s): PROBNP in the last 8760 hours.    Studies: Dg Chest Portable 1 View  Result Date: 12/26/2018 CLINICAL DATA:  Shortness of breath.  Cough for 6  days. EXAM: PORTABLE CHEST 1 VIEW COMPARISON:  None. FINDINGS: The study is limited due to low volume portable technique. No pneumothorax. Mild opacity in the medial right lung base. Lungs are otherwise clear. Suspected cardiomegaly, poorly evaluated due to technique. No other acute abnormalities. IMPRESSION: 1. The study is limited due to low volume portable technique. Mild opacity in the medial right lung base is not excluded. Recommend a better volume PA and lateral chest x-ray. 2. No other acute abnormalities. Electronically Signed   By: Gerome Samavid  Williams III M.D   On: 12/26/2018 12:49    Scheduled Meds: . enoxaparin (LOVENOX) injection  40 mg Subcutaneous Q24H    Admission status: Inpatient: Based on patients clinical presentation and evaluation of above clinical data, I have made determination that patient meets Inpatient criteria at this time.  Time spent: 20 min  Meredeth IdeGagan S Chayton Murata   [facility] Pager [phone]. If 7PM-7AM, please contact night-coverage at [email or web address], Office  [phone]  password [email or web address]  12/27/2018, 12:07 PM  LOS: 1 day

## 2018-12-28 LAB — COMPREHENSIVE METABOLIC PANEL
ALT: 67 U/L — ABNORMAL HIGH (ref 0–44)
AST: 43 U/L — ABNORMAL HIGH (ref 15–41)
Albumin: 3 g/dL — ABNORMAL LOW (ref 3.5–5.0)
Alkaline Phosphatase: 97 U/L (ref 38–126)
Anion gap: 10 (ref 5–15)
BUN: 5 mg/dL — ABNORMAL LOW (ref 6–20)
CO2: 23 mmol/L (ref 22–32)
Calcium: 8.1 mg/dL — ABNORMAL LOW (ref 8.9–10.3)
Chloride: 102 mmol/L (ref 98–111)
Creatinine, Ser: 0.62 mg/dL (ref 0.44–1.00)
GFR calc Af Amer: >60 mL/min (ref >60)
GFR calc non Af Amer: >60 mL/min (ref >60)
Glucose, Bld: 133 mg/dL — ABNORMAL HIGH (ref 70–99)
Potassium: 3.7 mmol/L (ref 3.5–5.1)
Sodium: 135 mmol/L (ref 135–145)
Total Bilirubin: 2 mg/dL — ABNORMAL HIGH (ref 0.3–1.2)
Total Protein: 7.5 g/dL (ref 6.5–8.1)

## 2018-12-28 LAB — CBC
HCT: 32.4 % — ABNORMAL LOW (ref 36.0–46.0)
Hemoglobin: 10.3 g/dL — ABNORMAL LOW (ref 12.0–15.0)
MCH: 27.5 pg (ref 26.0–34.0)
MCHC: 31.8 g/dL (ref 30.0–36.0)
MCV: 86.4 fL (ref 80.0–100.0)
Platelets: 353 10*3/uL (ref 150–400)
RBC: 3.75 MIL/uL — ABNORMAL LOW (ref 3.87–5.11)
RDW: 12.9 % (ref 11.5–15.5)
WBC: 13.7 10*3/uL — ABNORMAL HIGH (ref 4.0–10.5)
nRBC: 0 % (ref 0.0–0.2)

## 2018-12-28 MED ORDER — BENZONATATE 100 MG PO CAPS
200.0000 mg | ORAL_CAPSULE | Freq: Three times a day (TID) | ORAL | Status: DC | PRN
  Administered 2018-12-28: 200 mg via ORAL
  Filled 2018-12-28: qty 2

## 2018-12-28 MED ORDER — GUAIFENESIN ER 600 MG PO TB12
1200.0000 mg | ORAL_TABLET | Freq: Two times a day (BID) | ORAL | Status: DC
  Administered 2018-12-28 – 2018-12-31 (×6): 1200 mg via ORAL
  Filled 2018-12-28 (×6): qty 2

## 2018-12-28 MED ORDER — SODIUM CHLORIDE 0.9 % IV SOLN
1.0000 g | Freq: Once | INTRAVENOUS | Status: AC
  Administered 2018-12-28: 1 g via INTRAVENOUS
  Filled 2018-12-28: qty 1

## 2018-12-28 MED ORDER — SODIUM CHLORIDE 0.9 % IV SOLN
2.0000 g | INTRAVENOUS | Status: DC
  Administered 2018-12-29: 2 g via INTRAVENOUS
  Filled 2018-12-28: qty 2
  Filled 2018-12-28: qty 20

## 2018-12-28 NOTE — Progress Notes (Signed)
[facility]  PROGRESS NOTE  Valerie Moss ZMO:[id number] DOB: 1976-05-04 DOA: 12/26/2018 PCP: Patient, No Pcp Per   Brief HPI:   43 year old female with no significant medical problems who works at [employer] came to ED with shortness of breath, cough and fever.  COVID-19 test was negative in the ED, chest x-ray showed pneumonia.  Repeat COVID-19 test today also turned out to be negative.    Subjective   Patient seen and examined, still coughing up phlegm but as per patient her phlegm color has changed from yellow to white.  Feels better today.   Assessment/Plan:     1. Community-acquired pneumonia-patient admitted with community-acquired pneumonia, started on Rocephin and Zithromax.  COVID-19 test is negative.  Lactic acid 1.1, strep pneumo urinary antigen is pending, sputum culture is pending.  Repeat chest x-ray shows bilateral pneumonia. HIV is nonreactive.  2. Mild hyponatremia-resolved, patient presented with mild hyponatremia with sodium 133, improved to  136 with IV fluids.  Likely from dehydration and poor p.o. intake.  Pneumonia itself can cause SIADH.  Will closely monitor patient's serum sodium in the hospital.  3. Hypokalemia-replete.      CBC: Recent Labs  Lab 12/26/18 1154 12/27/18 0346 12/28/18 0456  WBC 11.6* 10.7* 13.7*  NEUTROABS 8.4* 7.9*  --   HGB 11.1* 10.4* 10.3*  HCT 35.0* 34.6* 32.4*  MCV 84.3 87.2 86.4  PLT 325 291 353    Basic Metabolic Panel: Recent Labs  Lab 12/26/18 1154 12/27/18 0346 12/28/18 0456  NA 133* 136 135  K 3.4* 3.3* 3.7  CL 99 104 102  CO2 24 22 23   GLUCOSE 136* 141* 133*  BUN 7 6 5*  CREATININE 0.77 0.66 0.62  CALCIUM 8.3* 7.6* 8.1*     DVT prophylaxis: Lovenox  Code Status: Full code  Family Communication: No family at bedside  Disposition Plan: likely home when medically ready for discharge     Consultants:    Procedures:     Antibiotics:   Anti-infectives (From admission, onward)   Start     Dose/Rate Route Frequency Ordered Stop   12/29/18 1000  cefTRIAXone (ROCEPHIN) 2 g in sodium chloride 0.9 % 100 mL IVPB     2 g 200 mL/hr over 30 Minutes Intravenous Every 24 hours 12/28/18 1312 01/02/19 0959   12/28/18 1400  cefTRIAXone (ROCEPHIN) 1 g in sodium chloride 0.9 % 100 mL IVPB     1 g 200 mL/hr over 30 Minutes Intravenous  Once 12/28/18 1313 12/28/18 1442   12/27/18 1000  cefTRIAXone (ROCEPHIN) 1 g in sodium chloride 0.9 % 100 mL IVPB  Status:  Discontinued     1 g 200 mL/hr over 30 Minutes Intravenous Every 24 hours 12/26/18 1832 12/28/18 1312   12/27/18 1000  azithromycin (ZITHROMAX) 500 mg in sodium chloride 0.9 % 250 mL IVPB     500 mg 250 mL/hr over 60 Minutes Intravenous Every 24 hours 12/26/18 1832 01/02/19 0959   12/26/18 1300  cefTRIAXone (ROCEPHIN) 2 g in sodium chloride 0.9 % 100 mL IVPB     2 g 200 mL/hr over 30 Minutes Intravenous  Once 12/26/18 1255 12/26/18 1618   12/26/18 1300  azithromycin (ZITHROMAX) 500 mg in sodium chloride 0.9 % 250 mL IVPB     500 mg 250 mL/hr over 60 Minutes Intravenous  Once 12/26/18 1255 12/26/18 1618       Objective   Vitals:   12/28/18 0939 12/28/18 1033 12/28/18 1345 12/28/18 1346  BP: (!) 170/98  Marland Kitchen)  145/85   Pulse: 95  89   Resp: 20  16   Temp: 98.9 F (37.2 C)  98.7 F (37.1 C)   TempSrc: Oral  Oral   SpO2: 95% 92% 97% 96%  Weight:      Height:        Intake/Output Summary (Last 24 hours) at 12/28/2018 1645 Last data filed at 12/28/2018 1559 Gross per 24 hour  Intake 2633.16 ml  Output 1500 ml  Net 1133.16 ml   Filed Weights   12/26/18 1153 12/26/18 1831  Weight: 128.8 kg 135.1 kg     Physical Examination:   General: Appears in no acute distress  Cardiovascular: S1-S2, regular, no murmur auscultated  Respiratory: Clear to auscultation bilaterally, no wheezing or crackles auscultated  Abdomen: Abdomen is soft, nondistended, no organomegaly.  Nontender to palpation.  Extremities: No edema  in the lower extremities  Neurologic: Alert, oriented x3, no focal deficit noted.     Data Reviewed: I have personally reviewed following labs and imaging studies   Recent Results (from the past 240 hour(s))  Blood culture (routine x 2)     Status: None (Preliminary result)   Collection Time: 12/26/18 12:46 PM  Result Value Ref Range Status   Specimen Description   Final    BLOOD LEFT ANTECUBITAL Performed at [facility], [address], [place], [place] [address]    Special Requests   Final    BOTTLES DRAWN AEROBIC AND ANAEROBIC Blood Culture adequate volume Performed at [facility], [address], [place], [place] [address]    Culture   Final    NO GROWTH 2 DAYS Performed at [facility], [address], [place], [place] [address]    Report Status PENDING  Incomplete  SARS Coronavirus 2 [facility] order, Performed in [facility] hospital lab)     Status: None   Collection Time: 12/26/18 12:46 PM  Result Value Ref Range Status   SARS Coronavirus 2 NEGATIVE NEGATIVE Final    Comment: (NOTE) If result is NEGATIVE SARS-CoV-2 target nucleic acids are NOT DETECTED. The SARS-CoV-2 RNA is generally detectable in upper and lower  respiratory specimens during the acute phase of infection. The lowest  concentration of SARS-CoV-2 viral copies this assay can detect is 250  copies / mL. A negative result does not preclude SARS-CoV-2 infection  and should not be used as the sole basis for treatment or other  patient management decisions.  A negative result may occur with  improper specimen collection / handling, submission of specimen other  than nasopharyngeal swab, presence of viral mutation(s) within the  areas targeted by this assay, and inadequate number of viral copies  (<250 copies / mL). A negative result must be combined with clinical  observations, patient history, and epidemiological information. If result is POSITIVE SARS-CoV-2 target  nucleic acids are DETECTED. The SARS-CoV-2 RNA is generally detectable in upper and lower  respiratory specimens dur ing the acute phase of infection.  Positive  results are indicative of active infection with SARS-CoV-2.  Clinical  correlation with patient history and other diagnostic information is  necessary to determine patient infection status.  Positive results do  not rule out bacterial infection or co-infection with other viruses. If result is PRESUMPTIVE POSTIVE SARS-CoV-2 nucleic acids MAY BE PRESENT.   A presumptive positive result was obtained on the submitted specimen  and confirmed on repeat testing.  While 2019 novel coronavirus  (SARS-CoV-2) nucleic acids may be present  in the submitted sample  additional confirmatory testing may be necessary for epidemiological  and / or clinical management purposes  to differentiate between  SARS-CoV-2 and other Sarbecovirus currently known to infect humans.  If clinically indicated additional testing with an alternate test  methodology [phone]) is advised. The SARS-CoV-2 RNA is generally  detectable in upper and lower respiratory sp ecimens during the acute  phase of infection. The expected result is Negative. Fact Sheet for Patients:  BoilerBrush.com.cyhttps://www.fda.gov/media/136312/download Fact Sheet for Healthcare Providers: https://pope.com/https://www.fda.gov/media/136313/download This test is not yet approved or cleared by the [place] FDA and has been authorized for detection and/or diagnosis of SARS-CoV-2 by FDA under an Emergency Use Authorization (EUA).  This EUA will remain in effect (meaning this test can be used) for the duration of the COVID-19 declaration under Section 564(b)(1) of the Act, 21 U.S.C. section 360bbb-3(b)(1), unless the authorization is terminated or revoked sooner. Performed at [facility], [address] [place], [place] [address]   Blood culture (routine x 2)     Status: None (Preliminary result)    Collection Time: 12/26/18 12:51 PM  Result Value Ref Range Status   Specimen Description   Final    BLOOD LEFT ANTECUBITAL Performed at [facility], [address] [place], [place] [address]    Special Requests   Final    BOTTLES DRAWN AEROBIC AND ANAEROBIC Blood Culture adequate volume Performed at [facility], [address] [place], [place] [address]    Culture   Final    NO GROWTH 2 DAYS Performed at [facility], [address] [place], [place] [address]    Report Status PENDING  Incomplete  SARS Coronavirus 2 [facility](Hospital order, Performed in [facility] hospital lab)     Status: None   Collection Time: 12/27/18  6:09 AM  Result Value Ref Range Status   SARS Coronavirus 2 NEGATIVE NEGATIVE Final    Comment: (NOTE) If result is NEGATIVE SARS-CoV-2 target nucleic acids are NOT DETECTED. The SARS-CoV-2 RNA is generally detectable in upper and lower  respiratory specimens during the acute phase of infection. The lowest  concentration of SARS-CoV-2 viral copies this assay can detect is 250  copies / mL. A negative result does not preclude SARS-CoV-2 infection  and should not be used as the sole basis for treatment or other  patient management decisions.  A negative result may occur with  improper specimen collection / handling, submission of specimen other  than nasopharyngeal swab, presence of viral mutation(s) within the  areas targeted by this assay, and inadequate number of viral copies  (<250 copies / mL). A negative result must be combined with clinical  observations, patient history, and epidemiological information. If result is POSITIVE SARS-CoV-2 target nucleic acids are DETECTED. The SARS-CoV-2 RNA is generally detectable in upper and lower  respiratory specimens dur ing the acute phase of infection.  Positive  results are indicative of active infection with SARS-CoV-2.  Clinical  correlation with patient history and other diagnostic  information is  necessary to determine patient infection status.  Positive results do  not rule out bacterial infection or co-infection with other viruses. If result is PRESUMPTIVE POSTIVE SARS-CoV-2 nucleic acids MAY BE PRESENT.   A presumptive positive result was obtained on the submitted specimen  and confirmed on repeat testing.  While 2019 novel coronavirus  (SARS-CoV-2) nucleic acids may be present in the submitted sample  additional confirmatory testing may be necessary for epidemiological  and / or clinical management purposes  to differentiate between  SARS-CoV-2 and other Sarbecovirus currently known to infect humans.  If clinically indicated additional testing with an alternate test  methodology [phone]) is advised. The SARS-CoV-2 RNA is generally  detectable in upper and lower respiratory sp ecimens during the acute  phase of infection. The expected result is Negative. Fact Sheet for Patients:  BoilerBrush.com.cy Fact Sheet for Healthcare Providers: https://pope.com/ This test is not yet approved or cleared by the [place] FDA and has been authorized for detection and/or diagnosis of SARS-CoV-2 by FDA under an Emergency Use Authorization (EUA).  This EUA will remain in effect (meaning this test can be used) for the duration of the COVID-19 declaration under Section 564(b)(1) of the Act, 21 U.S.C. section 360bbb-3(b)(1), unless the authorization is terminated or revoked sooner. Performed at [facility], [address] [place], [place] [address]      Liver Function Tests: Recent Labs  Lab 12/26/18 1154 12/27/18 0346 12/28/18 0456  AST 56* 55* 43*  ALT 78* 69* 67*  ALKPHOS 94 87 97  BILITOT 4.0* 3.2* 2.0*  PROT 8.1 6.9 7.5  ALBUMIN 3.5 2.9* 3.0*      Studies: Dg Chest 2 View  Result Date: 12/27/2018 CLINICAL DATA:  Acute shortness of breath, cough and fever for 1 week. EXAM: CHEST - 2  VIEW COMPARISON:  12/26/2018 FINDINGS: Bilateral LOWER lung airspace opacities are identified. Mild cardiomegaly noted. No pleural effusion or pneumothorax. No acute bony abnormalities are identified. IMPRESSION: Bilateral LOWER lung airspace opacities likely representing pneumonia. Consider short-term radiographic follow-up to resolution. Electronically Signed   By: Harmon Pier M.D.   On: 12/27/2018 13:18    Scheduled Meds: . enoxaparin (LOVENOX) injection  40 mg Subcutaneous Q24H  . guaiFENesin  1,200 mg Oral BID    Admission status: Inpatient: Based on patients clinical presentation and evaluation of above clinical data, I have made determination that patient meets Inpatient criteria at this time.  Time spent: 20 min  Meredeth Ide   [facility] Pager [phone]. If 7PM-7AM, please contact night-coverage at [email or web address], Office  [phone]  password [email or web address]  12/28/2018, 4:45 PM  LOS: 2 days

## 2018-12-28 NOTE — Progress Notes (Signed)
Pharmacy - ABX dosing  Assessment: 42 yoF on Rocephin/Zmax for CAP; weight recorded as 135 kg  Plan:  Increase Rocephin to 2g IV daily per P&T-approved Rx protocol for Rocephin dose adjustment by indication  AM dose already given; will give an additional Rocephin 1g IV this afternoon  Bernadene Person, PharmD, BCPS [phone] 12/28/2018, 1:18 PM

## 2018-12-28 NOTE — Plan of Care (Signed)
Pt's repeat covid test negative, CXR 2V shows PNA, pt on room air around 92% but still coughing with not much production.  Pt on NS @ 154ml/hr and pt not able to take in much nutrition at this time.  Pt is ambulatory in room and very tired today

## 2018-12-29 LAB — CBC
HCT: 30.6 % — ABNORMAL LOW (ref 36.0–46.0)
Hemoglobin: 9.5 g/dL — ABNORMAL LOW (ref 12.0–15.0)
MCH: 27.1 pg (ref 26.0–34.0)
MCHC: 31 g/dL (ref 30.0–36.0)
MCV: 87.2 fL (ref 80.0–100.0)
Platelets: 389 10*3/uL (ref 150–400)
RBC: 3.51 MIL/uL — ABNORMAL LOW (ref 3.87–5.11)
RDW: 13.2 % (ref 11.5–15.5)
WBC: 13 10*3/uL — ABNORMAL HIGH (ref 4.0–10.5)
nRBC: 0 % (ref 0.0–0.2)

## 2018-12-29 MED ORDER — AMLODIPINE BESYLATE 10 MG PO TABS
10.0000 mg | ORAL_TABLET | Freq: Every day | ORAL | Status: DC
  Administered 2018-12-29 – 2018-12-31 (×3): 10 mg via ORAL
  Filled 2018-12-29 (×3): qty 1

## 2018-12-29 MED ORDER — FUROSEMIDE 10 MG/ML IJ SOLN
40.0000 mg | Freq: Once | INTRAMUSCULAR | Status: AC
  Administered 2018-12-29: 40 mg via INTRAVENOUS
  Filled 2018-12-29: qty 4

## 2018-12-29 MED ORDER — HYDRALAZINE HCL 25 MG PO TABS
25.0000 mg | ORAL_TABLET | Freq: Four times a day (QID) | ORAL | Status: DC | PRN
  Filled 2018-12-29: qty 1

## 2018-12-29 NOTE — Progress Notes (Signed)
[facility]  PROGRESS NOTE  Valerie Moss WUJ:[id number] DOB: [birth year] DOA: 12/26/2018 PCP: Patient, No Pcp Per   Brief HPI:   43 year old female with no significant medical problems who works at [employer] came to ED with shortness of breath, cough and fever.  COVID-19 test was negative in the ED, chest x-ray showed pneumonia.  Repeat COVID-19 test today also turned out to be negative.    Subjective   Patient seen and examined, coughing has improved.  Breathing is also better.  Still requiring oxygen.  She is +5 L fluid overload.   Assessment/Plan:     1. Community-acquired pneumonia-patient admitted with community-acquired pneumonia, started on Rocephin and Zithromax.  COVID-19 test is negative.  Lactic acid 1.1, strep pneumo urinary antigen is pending, sputum culture is pending.  Repeat chest x-ray shows bilateral pneumonia. HIV is nonreactive.  2. Mild fluid overload-patient is positive for liters fluid overload, still requiring oxygen.  Will give 1 dose of Lasix 40 mg IV.  Might need diuresis if good response with Lasix.  3. Anemia-hemoglobin is 9.5, dropped from 11.13 days ago.  Likely dilutional.  Started on Lasix as above.  Follow CBC in a.m.  4. Mild hyponatremia-resolved, patient presented with mild hyponatremia with sodium 133, improved to  136 with IV fluids.  Likely from dehydration and poor p.o. intake.  Pneumonia itself can cause SIADH.  Will closely monitor patient's serum sodium in the hospital.  5. Hypokalemia-replete.      CBC: Recent Labs  Lab 12/26/18 1154 12/27/18 0346 12/28/18 0456 12/29/18 0457  WBC 11.6* 10.7* 13.7* 13.0*  NEUTROABS 8.4* 7.9*  --   --   HGB 11.1* 10.4* 10.3* 9.5*  HCT 35.0* 34.6* 32.4* 30.6*  MCV 84.3 87.2 86.4 87.2  PLT 325 291 353 389    Basic Metabolic Panel: Recent Labs  Lab 12/26/18 1154 12/27/18 0346 12/28/18 0456  NA 133* 136 135  K 3.4* 3.3* 3.7  CL 99 104 102  CO2 GLUCOSE 136* 141* 133*   BUN 7 6 5*  CREATININE 0.77 0.66 0.62  CALCIUM 8.3* 7.6* 8.1*     DVT prophylaxis: Lovenox  Code Status: Full code  Family Communication: No family at bedside  Disposition Plan: likely home when medically ready for discharge     Consultants:    Procedures:     Antibiotics:   Anti-infectives (From admission, onward)   Start     Dose/Rate Route Frequency Ordered Stop   12/29/18 1000  cefTRIAXone (ROCEPHIN) 2 g in sodium chloride 0.9 % 100 mL IVPB     2 g 200 mL/hr over 30 Minutes Intravenous Every 24 hours 12/28/18 1312 01/02/19 0959   12/28/18 1400  cefTRIAXone (ROCEPHIN) 1 g in sodium chloride 0.9 % 100 mL IVPB     1 g 200 mL/hr over 30 Minutes Intravenous  Once 12/28/18 1313 12/28/18 1442   12/27/18 1000  cefTRIAXone (ROCEPHIN) 1 g in sodium chloride 0.9 % 100 mL IVPB  Status:  Discontinued     1 g 200 mL/hr over 30 Minutes Intravenous Every 24 hours 12/26/18 1832 12/28/18 1312   12/27/18 1000  azithromycin (ZITHROMAX) 500 mg in sodium chloride 0.9 % 250 mL IVPB     500 mg 250 mL/hr over 60 Minutes Intravenous Every 24 hours 12/26/18 1832 01/02/19 0959   12/26/18 1300  cefTRIAXone (ROCEPHIN) 2 g in sodium chloride 0.9 % 100 mL IVPB     2 g 200 mL/hr over 30 Minutes  Intravenous  Once 12/26/18 1255 12/26/18 1618   12/26/18 1300  azithromycin (ZITHROMAX) 500 mg in sodium chloride 0.9 % 250 mL IVPB     500 mg 250 mL/hr over 60 Minutes Intravenous  Once 12/26/18 1255 12/26/18 1618       Objective   Vitals:   12/28/18 1346 12/28/18 2259 12/29/18 0601 12/29/18 1342  BP:  (!) 153/88 (!) 161/95 (!) 182/99  Pulse:  94 91 88  Resp:  Temp:  99.5 F (37.5 C) 98.6 F (37 C) 98.9 F (37.2 C)  TempSrc:    Oral  SpO2: 96% 93% 95% 94%  Weight:      Height:        Intake/Output Summary (Last 24 hours) at 12/29/2018 1519 Last data filed at 12/29/2018 0930 Gross per 24 hour  Intake 480 ml  Output 1000 ml  Net -520 ml   Filed Weights   12/26/18 1153  12/26/18 1831  Weight: 128.8 kg 135.1 kg     Physical Examination:   General: Appears in no acute distress  Cardiovascular: S1-S2, regular, no murmur auscultated  Respiratory: Decreased breath sounds bilaterally  Abdomen: Abdomen is soft, nontender, no organomegaly  Extremities: No edema in the lower extremities  Neurologic: Alert, oriented x3, no focal deficit noted      Data Reviewed: I have personally reviewed following labs and imaging studies   Recent Results (from the past 240 hour(s))  Blood culture (routine x 2)     Status: None (Preliminary result)   Collection Time: 12/26/18 12:46 PM  Result Value Ref Range Status   Specimen Description   Final    BLOOD LEFT ANTECUBITAL Performed at [facility], [address], [place], [place] [address]    Special Requests   Final    BOTTLES DRAWN AEROBIC AND ANAEROBIC Blood Culture adequate volume Performed at [facility], [address] [place], [place] [address]    Culture   Final    NO GROWTH 3 DAYS Performed at [facility], [address], [place], [place] [address]    Report Status PENDING  Incomplete  SARS Coronavirus 2 [facility] order, Performed in [facility] hospital lab)     Status: None   Collection Time: 12/26/18 12:46 PM  Result Value Ref Range Status   SARS Coronavirus 2 NEGATIVE NEGATIVE Final    Comment: (NOTE) If result is NEGATIVE SARS-CoV-2 target nucleic acids are NOT DETECTED. The SARS-CoV-2 RNA is generally detectable in upper and lower  respiratory specimens during the acute phase of infection. The lowest  concentration of SARS-CoV-2 viral copies this assay can detect is 250  copies / mL. A negative result does not preclude SARS-CoV-2 infection  and should not be used as the sole basis for treatment or other  patient management decisions.  A negative result may occur with  improper specimen collection / handling, submission of specimen other  than  nasopharyngeal swab, presence of viral mutation(s) within the  areas targeted by this assay, and inadequate number of viral copies  (<250 copies / mL). A negative result must be combined with clinical  observations, patient history, and epidemiological information. If result is POSITIVE SARS-CoV-2 target nucleic acids are DETECTED. The SARS-CoV-2 RNA is generally detectable in upper and lower  respiratory specimens dur ing the acute phase of infection.  Positive  results are indicative of active infection with SARS-CoV-2.  Clinical  correlation with patient history and other diagnostic information  is  necessary to determine patient infection status.  Positive results do  not rule out bacterial infection or co-infection with other viruses. If result is PRESUMPTIVE POSTIVE SARS-CoV-2 nucleic acids MAY BE PRESENT.   A presumptive positive result was obtained on the submitted specimen  and confirmed on repeat testing.  While 2019 novel coronavirus  (SARS-CoV-2) nucleic acids may be present in the submitted sample  additional confirmatory testing may be necessary for epidemiological  and / or clinical management purposes  to differentiate between  SARS-CoV-2 and other Sarbecovirus currently known to infect humans.  If clinically indicated additional testing with an alternate test  methodology [phone]) is advised. The SARS-CoV-2 RNA is generally  detectable in upper and lower respiratory sp ecimens during the acute  phase of infection. The expected result is Negative. Fact Sheet for Patients:  BoilerBrush.com.cyhttps://www.fda.gov/media/136312/download Fact Sheet for Healthcare Providers: https://pope.com/https://www.fda.gov/media/136313/download This test is not yet approved or cleared by the [place] FDA and has been authorized for detection and/or diagnosis of SARS-CoV-2 by FDA under an Emergency Use Authorization (EUA).  This EUA will remain in effect (meaning this test can be used) for the duration of  the COVID-19 declaration under Section 564(b)(1) of the Act, 21 U.S.C. section 360bbb-3(b)(1), unless the authorization is terminated or revoked sooner. Performed at [facility], [address] [place], [place] [address]   Blood culture (routine x 2)     Status: None (Preliminary result)   Collection Time: 12/26/18 12:51 PM  Result Value Ref Range Status   Specimen Description   Final    BLOOD LEFT ANTECUBITAL Performed at [facility], [address] [place], [place] [address]    Special Requests   Final    BOTTLES DRAWN AEROBIC AND ANAEROBIC Blood Culture adequate volume Performed at [facility], [address] [place], [place] [address]    Culture   Final    NO GROWTH 3 DAYS Performed at [facility], [address] [place], [place] [address]    Report Status PENDING  Incomplete  SARS Coronavirus 2 [facility](Hospital order, Performed in [facility] hospital lab)     Status: None   Collection Time: 12/27/18  6:09 AM  Result Value Ref Range Status   SARS Coronavirus 2 NEGATIVE NEGATIVE Final    Comment: (NOTE) If result is NEGATIVE SARS-CoV-2 target nucleic acids are NOT DETECTED. The SARS-CoV-2 RNA is generally detectable in upper and lower  respiratory specimens during the acute phase of infection. The lowest  concentration of SARS-CoV-2 viral copies this assay can detect is 250  copies / mL. A negative result does not preclude SARS-CoV-2 infection  and should not be used as the sole basis for treatment or other  patient management decisions.  A negative result may occur with  improper specimen collection / handling, submission of specimen other  than nasopharyngeal swab, presence of viral mutation(s) within the  areas targeted by this assay, and inadequate number of viral copies  (<250 copies / mL). A negative result must be combined with clinical  observations, patient history, and epidemiological information. If result is  POSITIVE SARS-CoV-2 target nucleic acids are DETECTED. The SARS-CoV-2 RNA is generally detectable in upper and lower  respiratory specimens dur ing the acute phase of infection.  Positive  results are indicative of active infection with SARS-CoV-2.  Clinical  correlation with patient history and other diagnostic information is  necessary to determine patient infection status.  Positive results do  not rule out bacterial infection or co-infection with other viruses. If result is PRESUMPTIVE POSTIVE SARS-CoV-2 nucleic acids MAY BE PRESENT.   A presumptive positive result was obtained on the submitted specimen  and confirmed on repeat testing.  While 2019 novel coronavirus  (SARS-CoV-2) nucleic acids may be present in the submitted sample  additional confirmatory testing may be necessary for epidemiological  and / or clinical management purposes  to differentiate between  SARS-CoV-2 and other Sarbecovirus currently known to infect humans.  If clinically indicated additional testing with an alternate test  methodology [phone]) is advised. The SARS-CoV-2 RNA is generally  detectable in upper and lower respiratory sp ecimens during the acute  phase of infection. The expected result is Negative. Fact Sheet for Patients:  BoilerBrush.com.cy Fact Sheet for Healthcare Providers: https://pope.com/ This test is not yet approved or cleared by the [place] FDA and has been authorized for detection and/or diagnosis of SARS-CoV-2 by FDA under an Emergency Use Authorization (EUA).  This EUA will remain in effect (meaning this test can be used) for the duration of the COVID-19 declaration under Section 564(b)(1) of the Act, 21 U.S.C. section 360bbb-3(b)(1), unless the authorization is terminated or revoked sooner. Performed at [facility], [address], [place], [place] [address]      Liver Function Tests: Recent Labs   Lab 12/26/18 1154 12/27/18 0346 12/28/18 0456  AST 56* 55* 43*  ALT 78* 69* 67*  ALKPHOS 94 87 97  BILITOT 4.0* 3.2* 2.0*  PROT 8.1 6.9 7.5  ALBUMIN 3.5 2.9* 3.0*      Studies: No results found.  Scheduled Meds: . amLODipine  10 mg Oral Daily  . enoxaparin (LOVENOX) injection  40 mg Subcutaneous Q24H  . furosemide  40 mg Intravenous Once  . guaiFENesin  1,200 mg Oral BID    Admission status: Inpatient: Based on patients clinical presentation and evaluation of above clinical data, I have made determination that patient meets Inpatient criteria at this time.  Time spent: 20 min  Meredeth Ide   [facility] Pager [phone]. If 7PM-7AM, please contact night-coverage at [email or web address], Office  [phone]  password [email or web address]  12/29/2018, 3:19 PM  LOS: 3 days

## 2018-12-29 NOTE — Plan of Care (Signed)
  Problem: Education: Goal: Knowledge of General Education information will improve Description: Including pain rating scale, medication(s)/side effects and non-pharmacologic comfort measures Outcome: Progressing   Problem: Health Behavior/Discharge Planning: Goal: Ability to manage health-related needs will improve Outcome: Progressing   Problem: Clinical Measurements: Goal: Ability to maintain clinical measurements within normal limits will improve Outcome: Progressing Goal: Will remain free from infection Outcome: Progressing Goal: Diagnostic test results will improve Outcome: Progressing Goal: Respiratory complications will improve Outcome: Progressing Goal: Cardiovascular complication will be avoided Outcome: Progressing   Problem: Activity: Goal: Risk for activity intolerance will decrease Outcome: Progressing   Problem: Nutrition: Goal: Adequate nutrition will be maintained Outcome: Progressing   Problem: Coping: Goal: Level of anxiety will decrease Outcome: Progressing   Problem: Elimination: Goal: Will not experience complications related to bowel motility Outcome: Progressing Goal: Will not experience complications related to urinary retention Outcome: Progressing   Problem: Pain Managment: Goal: General experience of comfort will improve Outcome: Progressing   Problem: Safety: Goal: Ability to remain free from injury will improve Outcome: Progressing   Problem: Skin Integrity: Goal: Risk for impaired skin integrity will decrease Outcome: Progressing   Problem: Nutrition Goal: Nutritional status is improving Description: Monitor and assess patient for malnutrition (ex- brittle hair, bruises, dry skin, pale skin and conjunctiva, muscle wasting, smooth red tongue, and disorientation). Collaborate with interdisciplinary team and initiate plan and interventions as ordered.  Monitor patient's weight and dietary intake as ordered or per policy. Utilize  nutrition screening tool and intervene per policy. Determine patient's food preferences and provide high-protein, high-caloric foods as appropriate.  Outcome: Progressing   

## 2018-12-30 DIAGNOSIS — E8770 Fluid overload, unspecified: Secondary | ICD-10-CM

## 2018-12-30 DIAGNOSIS — D649 Anemia, unspecified: Secondary | ICD-10-CM

## 2018-12-30 LAB — COMPREHENSIVE METABOLIC PANEL
ALT: 93 U/L — ABNORMAL HIGH (ref 0–44)
AST: 99 U/L — ABNORMAL HIGH (ref 15–41)
Albumin: 2.8 g/dL — ABNORMAL LOW (ref 3.5–5.0)
Alkaline Phosphatase: 117 U/L (ref 38–126)
Anion gap: 8 (ref 5–15)
BUN: 5 mg/dL — ABNORMAL LOW (ref 6–20)
CO2: 26 mmol/L (ref 22–32)
Calcium: 8.2 mg/dL — ABNORMAL LOW (ref 8.9–10.3)
Chloride: 103 mmol/L (ref 98–111)
Creatinine, Ser: 0.59 mg/dL (ref 0.44–1.00)
GFR calc Af Amer: >60 mL/min (ref >60)
GFR calc non Af Amer: >60 mL/min (ref >60)
Glucose, Bld: 130 mg/dL — ABNORMAL HIGH (ref 70–99)
Potassium: 3.4 mmol/L — ABNORMAL LOW (ref 3.5–5.1)
Sodium: 137 mmol/L (ref 135–145)
Total Bilirubin: 1.1 mg/dL (ref 0.3–1.2)
Total Protein: 7.1 g/dL (ref 6.5–8.1)

## 2018-12-30 LAB — CBC
HCT: 31.2 % — ABNORMAL LOW (ref 36.0–46.0)
Hemoglobin: 9.5 g/dL — ABNORMAL LOW (ref 12.0–15.0)
MCH: 26.9 pg (ref 26.0–34.0)
MCHC: 30.4 g/dL (ref 30.0–36.0)
MCV: 88.4 fL (ref 80.0–100.0)
Platelets: 436 10*3/uL — ABNORMAL HIGH (ref 150–400)
RBC: 3.53 MIL/uL — ABNORMAL LOW (ref 3.87–5.11)
RDW: 13.2 % (ref 11.5–15.5)
WBC: 10.7 10*3/uL — ABNORMAL HIGH (ref 4.0–10.5)
nRBC: 0 % (ref 0.0–0.2)

## 2018-12-30 MED ORDER — POTASSIUM CHLORIDE CRYS ER 20 MEQ PO TBCR
40.0000 meq | EXTENDED_RELEASE_TABLET | Freq: Two times a day (BID) | ORAL | Status: DC
  Administered 2018-12-30 – 2018-12-31 (×3): 40 meq via ORAL
  Filled 2018-12-30 (×3): qty 2

## 2018-12-30 MED ORDER — LEVOFLOXACIN 500 MG PO TABS
500.0000 mg | ORAL_TABLET | Freq: Every day | ORAL | Status: DC
  Administered 2018-12-30 – 2018-12-31 (×2): 500 mg via ORAL
  Filled 2018-12-30 (×2): qty 1

## 2018-12-30 MED ORDER — ENOXAPARIN SODIUM 60 MG/0.6ML ~~LOC~~ SOLN
60.0000 mg | Freq: Every day | SUBCUTANEOUS | Status: DC
  Administered 2018-12-30 – 2018-12-31 (×2): 60 mg via SUBCUTANEOUS
  Filled 2018-12-30 (×2): qty 0.6

## 2018-12-30 NOTE — Progress Notes (Signed)
Patient was sitting in chair at rest. Oxygen saturation at rest room air 86%. Oxygen reapplied at 2 liters, sat increased 98 %. Will assess pt this afternoon with activity.

## 2018-12-30 NOTE — Progress Notes (Signed)
[facility]  PROGRESS NOTE  Valerie Moss LKG:[id number] DOB: 06-26-1976 DOA: 12/26/2018 PCP: Patient, No Pcp Per   Brief HPI:   43 year old female with no significant medical problems who works at [employer] came to ED with shortness of breath, cough and fever.  COVID-19 test was negative in the ED, chest x-ray showed pneumonia.  Repeat COVID-19 test today also turned out to be negative.    Subjective   Complaining of passing a lot of urine after Lasix Main difficulty is when she goes to the restroom and feels short winded No events on monitors Does not like various types of breakfast   Assessment/Plan:     1. Community-acquired pneumonia-patient admitted with community-acquired pneumonia, transitioned off IV Rocephin and Zithromax to Levaquin 4/22 lactic acid 1.1,  strep pneumo urinary??  sputum culture ?? Repeat chest x-ray 4/19 shows bilateral pneumonia--needs rpt in 4 weeks  HIV is nonreactive. WBC down  2. Mild fluid overload-patient is positive for liters fluid overload, still requiring oxygen.  Will give 1 dose of Lasix 40 mg IV.  Might need diuresis if good response with Lasix.  Encouraged raising legs above heart as doesn't wish more lasix  3. Anemia-hemoglobin is 9.5-stable at thistime  4. Mild hyponatremia-resolved, patient presented with mild hyponatremia with sodium 133, improved to= 137  5. Hypokalemia-repleted--add KDUR bid   CBC: Recent Labs  Lab 12/26/18 1154 12/27/18 0346 12/28/18 0456 12/29/18 0457 12/30/18 0513  WBC 11.6* 10.7* 13.7* 13.0* 10.7*  NEUTROABS 8.4* 7.9*  --   --   --   HGB 11.1* 10.4* 10.3* 9.5* 9.5*  HCT 35.0* 34.6* 32.4* 30.6* 31.2*  MCV 84.3 87.2 86.4 87.2 88.4  PLT 325 291 353 389 436*    Basic Metabolic Panel: Recent Labs  Lab 12/26/18 1154 12/27/18 0346 12/28/18 0456 12/30/18 0513  NA 133* 136 135 137  K 3.4* 3.3* 3.7 3.4*  CL 99 104 102 103  CO2 GLUCOSE 136* 141* 133* 130*  BUN 7 6 5* 5*   CREATININE 0.77 0.66 0.62 0.59  CALCIUM 8.3* 7.6* 8.1* 8.2*     DVT prophylaxis: Lovenox  Code Status: Full code  Family Communication: No family at bedside  Disposition Plan: likely home when medically ready for discharge     Consultants:    Procedures:     Antibiotics:   Anti-infectives (From admission, onward)   Start     Dose/Rate Route Frequency Ordered Stop   12/30/18 1300  levofloxacin (LEVAQUIN) tablet 500 mg     500 mg Oral Daily 12/30/18 0739     12/29/18 1000  cefTRIAXone (ROCEPHIN) 2 g in sodium chloride 0.9 % 100 mL IVPB  Status:  Discontinued     2 g 200 mL/hr over 30 Minutes Intravenous Every 24 hours 12/28/18 1312 12/30/18 0739   12/28/18 1400  cefTRIAXone (ROCEPHIN) 1 g in sodium chloride 0.9 % 100 mL IVPB     1 g 200 mL/hr over 30 Minutes Intravenous  Once 12/28/18 1313 12/28/18 1442   12/27/18 1000  cefTRIAXone (ROCEPHIN) 1 g in sodium chloride 0.9 % 100 mL IVPB  Status:  Discontinued     1 g 200 mL/hr over 30 Minutes Intravenous Every 24 hours 12/26/18 1832 12/28/18 1312   12/27/18 1000  azithromycin (ZITHROMAX) 500 mg in sodium chloride 0.9 % 250 mL IVPB  Status:  Discontinued     500 mg 250 mL/hr over 60 Minutes Intravenous Every 24 hours 12/26/18 1832 12/30/18 0739  12/26/18 1300  cefTRIAXone (ROCEPHIN) 2 g in sodium chloride 0.9 % 100 mL IVPB     2 g 200 mL/hr over 30 Minutes Intravenous  Once 12/26/18 1255 12/26/18 1618   12/26/18 1300  azithromycin (ZITHROMAX) 500 mg in sodium chloride 0.9 % 250 mL IVPB     500 mg 250 mL/hr over 60 Minutes Intravenous  Once 12/26/18 1255 12/26/18 1618       Objective   Vitals:   12/29/18 0601 12/29/18 1342 12/29/18 2028 12/30/18 0528  BP: (!) 161/95 (!) 182/99 (!) 147/82 (!) 167/94  Pulse: 91 88 94 81  Resp: 19 20 15 14   Temp: 98.6 F (37 C) 98.9 F (37.2 C) 99.2 F (37.3 C) 98.2 F (36.8 C)  TempSrc:  Oral    SpO2: 95% 94% (!) 89% 95%  Weight:      Height:        Intake/Output  Summary (Last 24 hours) at 12/30/2018 0946 Last data filed at 12/30/2018 0147 Gross per 24 hour  Intake 3561.09 ml  Output -  Net 3561.09 ml   Filed Weights   12/26/18 1153 12/26/18 1831  Weight: 128.8 kg 135.1 kg     Physical Examination:  Awake alert thick neck no JVD no thyromegaly no submandibular lymphadenopathy S1-S2 no murmur rub or gallop Chest clinically clear no rales no rhonchi neuro intact No le edema  Data Reviewed: I have personally reviewed following labs and imaging studies   Recent Results (from the past 240 hour(s))  Blood culture (routine x 2)     Status: None (Preliminary result)   Collection Time: 12/26/18 12:46 PM  Result Value Ref Range Status   Specimen Description   Final    BLOOD LEFT ANTECUBITAL Performed at [facility], [address] [place], [place] [address]    Special Requests   Final    BOTTLES DRAWN AEROBIC AND ANAEROBIC Blood Culture adequate volume Performed at [facility], [address] [place], [place] [address]    Culture   Final    NO GROWTH 3 DAYS Performed at [facility], [address] [place], [place] [address]    Report Status PENDING  Incomplete  SARS Coronavirus 2 [facility](Hospital order, Performed in [facility] hospital lab)     Status: None   Collection Time: 12/26/18 12:46 PM  Result Value Ref Range Status   SARS Coronavirus 2 NEGATIVE NEGATIVE Final    Comment: (NOTE) If result is NEGATIVE SARS-CoV-2 target nucleic acids are NOT DETECTED. The SARS-CoV-2 RNA is generally detectable in upper and lower  respiratory specimens during the acute phase of infection. The lowest  concentration of SARS-CoV-2 viral copies this assay can detect is 250  copies / mL. A negative result does not preclude SARS-CoV-2 infection  and should not be used as the sole basis for treatment or other  patient management decisions.  A negative result may occur with  improper specimen collection / handling,  submission of specimen other  than nasopharyngeal swab, presence of viral mutation(s) within the  areas targeted by this assay, and inadequate number of viral copies  (<250 copies / mL). A negative result must be combined with clinical  observations, patient history, and epidemiological information. If result is POSITIVE SARS-CoV-2 target nucleic acids are DETECTED. The SARS-CoV-2 RNA is generally detectable in upper and lower  respiratory specimens dur ing the acute phase of infection.  Positive  results are indicative of active infection with SARS-CoV-2.  Clinical  correlation with patient history and other diagnostic information is  necessary to determine patient infection status.  Positive results do  not rule out bacterial infection or co-infection with other viruses. If result is PRESUMPTIVE POSTIVE SARS-CoV-2 nucleic acids MAY BE PRESENT.   A presumptive positive result was obtained on the submitted specimen  and confirmed on repeat testing.  While 2019 novel coronavirus  (SARS-CoV-2) nucleic acids may be present in the submitted sample  additional confirmatory testing may be necessary for epidemiological  and / or clinical management purposes  to differentiate between  SARS-CoV-2 and other Sarbecovirus currently known to infect humans.  If clinically indicated additional testing with an alternate test  methodology [phone]) is advised. The SARS-CoV-2 RNA is generally  detectable in upper and lower respiratory sp ecimens during the acute  phase of infection. The expected result is Negative. Fact Sheet for Patients:  BoilerBrush.com.cy Fact Sheet for Healthcare Providers: https://pope.com/ This test is not yet approved or cleared by the [place] FDA and has been authorized for detection and/or diagnosis of SARS-CoV-2 by FDA under an Emergency Use Authorization (EUA).  This EUA will remain in effect (meaning this test can be  used) for the duration of the COVID-19 declaration under Section 564(b)(1) of the Act, 21 U.S.C. section 360bbb-3(b)(1), unless the authorization is terminated or revoked sooner. Performed at [facility], [address] [place], [place] [address]   Blood culture (routine x 2)     Status: None (Preliminary result)   Collection Time: 12/26/18 12:51 PM  Result Value Ref Range Status   Specimen Description   Final    BLOOD LEFT ANTECUBITAL Performed at [facility], [address] [place], [place] [address]    Special Requests   Final    BOTTLES DRAWN AEROBIC AND ANAEROBIC Blood Culture adequate volume Performed at [facility], [address], [place], [place] [address]    Culture   Final    NO GROWTH 3 DAYS Performed at [facility], [address], [place], [place] [address]    Report Status PENDING  Incomplete  SARS Coronavirus 2 [facility] order, Performed in [facility] hospital lab)     Status: None   Collection Time: 12/27/18  6:09 AM  Result Value Ref Range Status   SARS Coronavirus 2 NEGATIVE NEGATIVE Final    Comment: (NOTE) If result is NEGATIVE SARS-CoV-2 target nucleic acids are NOT DETECTED. The SARS-CoV-2 RNA is generally detectable in upper and lower  respiratory specimens during the acute phase of infection. The lowest  concentration of SARS-CoV-2 viral copies this assay can detect is 250  copies / mL. A negative result does not preclude SARS-CoV-2 infection  and should not be used as the sole basis for treatment or other  patient management decisions.  A negative result may occur with  improper specimen collection / handling, submission of specimen other  than nasopharyngeal swab, presence of viral mutation(s) within the  areas targeted by this assay, and inadequate number of viral copies  (<250 copies / mL). A negative result must be combined with clinical  observations, patient history, and epidemiological  information. If result is POSITIVE SARS-CoV-2 target nucleic acids are DETECTED. The SARS-CoV-2 RNA is generally detectable in upper and lower  respiratory specimens dur ing the acute phase of infection.  Positive  results are indicative of active infection with SARS-CoV-2.  Clinical  correlation with patient history and other diagnostic information is  necessary  to determine patient infection status.  Positive results do  not rule out bacterial infection or co-infection with other viruses. If result is PRESUMPTIVE POSTIVE SARS-CoV-2 nucleic acids MAY BE PRESENT.   A presumptive positive result was obtained on the submitted specimen  and confirmed on repeat testing.  While 2019 novel coronavirus  (SARS-CoV-2) nucleic acids may be present in the submitted sample  additional confirmatory testing may be necessary for epidemiological  and / or clinical management purposes  to differentiate between  SARS-CoV-2 and other Sarbecovirus currently known to infect humans.  If clinically indicated additional testing with an alternate test  methodology [phone]) is advised. The SARS-CoV-2 RNA is generally  detectable in upper and lower respiratory sp ecimens during the acute  phase of infection. The expected result is Negative. Fact Sheet for Patients:  BoilerBrush.com.cy Fact Sheet for Healthcare Providers: https://pope.com/ This test is not yet approved or cleared by the [place] FDA and has been authorized for detection and/or diagnosis of SARS-CoV-2 by FDA under an Emergency Use Authorization (EUA).  This EUA will remain in effect (meaning this test can be used) for the duration of the COVID-19 declaration under Section 564(b)(1) of the Act, 21 U.S.C. section 360bbb-3(b)(1), unless the authorization is terminated or revoked sooner. Performed at [facility], [address], [place], [place] [address]      Liver  Function Tests: Recent Labs  Lab 12/26/18 1154 12/27/18 0346 12/28/18 0456 12/30/18 0513  AST 56* 55* 43* 99*  ALT 78* 69* 67* 93*  ALKPHOS 94 87 97 117  BILITOT 4.0* 3.2* 2.0* 1.1  PROT 8.1 6.9 7.5 7.1  ALBUMIN 3.5 2.9* 3.0* 2.8*      Studies: No results found.  Scheduled Meds: . amLODipine  10 mg Oral Daily  . enoxaparin (LOVENOX) injection  40 mg Subcutaneous Q24H  . guaiFENesin  1,200 mg Oral BID  . levofloxacin  500 mg Oral Daily    Admission status: Inpatient: Based on patients clinical presentation and evaluation of above clinical data, I have made determination that patient meets Inpatient criteria at this time.  Time spent: 20 min  Jai-Gurmukh Suezanne Cheshire, MD [facility] 9:54 AM   12/30/2018, 9:46 AM  LOS: 4 days

## 2018-12-30 NOTE — Progress Notes (Addendum)
PHARMACY NOTE -  Enoxaparin  Pharmacy has been assisting with dosing of Lovenox for VTE ppx.  Dosage remains stable at 60 mg SQ daily and need for further dosage adjustment appears unlikely at present given excellent renal function  Hgb low but stable at 9.5, Plt high/normal  Pharmacy will sign off, following peripherally for dose adjustments. Please reconsult if a change in clinical status warrants re-evaluation of dosage.  Bernadene Person, PharmD, BCPS [phone] 12/30/2018, 10:03 AM

## 2018-12-31 ENCOUNTER — Inpatient Hospital Stay (HOSPITAL_COMMUNITY): Payer: Medicaid Other

## 2018-12-31 LAB — CBC WITH DIFFERENTIAL/PLATELET
Basophils Absolute: 0.1 10*3/uL (ref 0.0–0.1)
Basophils Relative: 1 %
Eosinophils Absolute: 0.2 10*3/uL (ref 0.0–0.5)
Eosinophils Relative: 2 %
HCT: 32.4 % — ABNORMAL LOW (ref 36.0–46.0)
Hemoglobin: 9.8 g/dL — ABNORMAL LOW (ref 12.0–15.0)
Lymphocytes Relative: 22 %
Lymphs Abs: 2.1 10*3/uL (ref 0.7–4.0)
MCH: 26.9 pg (ref 26.0–34.0)
MCHC: 30.2 g/dL (ref 30.0–36.0)
MCV: 89 fL (ref 80.0–100.0)
Monocytes Absolute: 0.7 10*3/uL (ref 0.1–1.0)
Monocytes Relative: 7 %
Neutro Abs: 5.9 10*3/uL (ref 1.7–7.7)
Neutrophils Relative %: 60 %
Platelets: 470 10*3/uL — ABNORMAL HIGH (ref 150–400)
RBC: 3.64 MIL/uL — ABNORMAL LOW (ref 3.87–5.11)
RDW: 13.5 % (ref 11.5–15.5)
WBC: 9.8 10*3/uL (ref 4.0–10.5)
nRBC: 0.2 % (ref 0.0–0.2)

## 2018-12-31 LAB — RENAL FUNCTION PANEL
Albumin: 2.9 g/dL — ABNORMAL LOW (ref 3.5–5.0)
Anion gap: 8 (ref 5–15)
BUN: 6 mg/dL (ref 6–20)
CO2: 26 mmol/L (ref 22–32)
Calcium: 8.6 mg/dL — ABNORMAL LOW (ref 8.9–10.3)
Chloride: 103 mmol/L (ref 98–111)
Creatinine, Ser: 0.63 mg/dL (ref 0.44–1.00)
GFR calc Af Amer: >60 mL/min (ref >60)
GFR calc non Af Amer: >60 mL/min (ref >60)
Glucose, Bld: 134 mg/dL — ABNORMAL HIGH (ref 70–99)
Phosphorus: 2.8 mg/dL (ref 2.5–4.6)
Potassium: 4.3 mmol/L (ref 3.5–5.1)
Sodium: 137 mmol/L (ref 135–145)

## 2018-12-31 LAB — CULTURE, BLOOD (ROUTINE X 2)
Culture: NO GROWTH
Culture: NO GROWTH
Special Requests: ADEQUATE
Special Requests: ADEQUATE

## 2018-12-31 LAB — STREP PNEUMONIAE URINARY ANTIGEN: Strep Pneumo Urinary Antigen: NEGATIVE

## 2018-12-31 MED ORDER — AMLODIPINE BESYLATE 10 MG PO TABS: 10.0000 mg | ORAL_TABLET | Freq: Every day | ORAL | 3 refills | Status: DC

## 2018-12-31 MED ORDER — LEVOFLOXACIN 500 MG PO TABS: 500.0000 mg | ORAL_TABLET | Freq: Every day | ORAL | 0 refills | Status: DC

## 2018-12-31 MED ORDER — HYDROCHLOROTHIAZIDE 25 MG PO TABS: 25.0000 mg | ORAL_TABLET | Freq: Every day | ORAL | 3 refills | Status: DC

## 2018-12-31 MED ORDER — HYDROCHLOROTHIAZIDE 25 MG PO TABS
25.0000 mg | ORAL_TABLET | Freq: Every day | ORAL | Status: DC
  Administered 2018-12-31: 25 mg via ORAL
  Filled 2018-12-31: qty 1

## 2018-12-31 NOTE — Plan of Care (Signed)

## 2018-12-31 NOTE — Progress Notes (Signed)
PCP follow up appointment, [facility] 01/11/2019 at 9:20 AM.  Pt is aware

## 2018-12-31 NOTE — Discharge Summary (Signed)
Physician Discharge Summary  Valerie Moss UJW:[id number] DOB: 01-11-1976 DOA: 12/26/2018  PCP: Patient, No Pcp Per  Admit date: 12/26/2018 Discharge date: 12/31/2018  Time spent: 25 minutes  Recommendations for Outpatient Follow-up:  1. Needs to complete levaquin 2. new addition hctz this admit 3. Needs labs 1 week  Discharge Diagnoses:  Principal Problem:   Pneumonia Active Problems:   Tachypnea   Leucocytosis   Hypokalemia   Hyponatremia   Discharge Condition: improved  Diet recommendation:  Filed Weights   12/26/18 1153 12/26/18 1831  Weight: 128.8 kg 135.1 kg    History of present illness:    43 year old female with no significant medical problems who works at [employer] came to ED with shortness of breath, cough and fever.   COVID-19 test [-] x 2  chest x-ray showed pneumonia.   Hospital Course:  1. Community-acquired pneumonia-patient admitted with community-acquired pneumonia, transitioned off IV Rocephin and Zithromax to Levaquin 4/22 lactic acid 1.1,  Repeat chest x-ray 4/23 shows bilateral pneumonia improved form last on e on 4/19--needs rpt in 4 weeks  HIV is nonreactive. WBC down  2. Iatrogenic fluid overload without HF-patient is positive for excess fluid--needed 1 x lasix.  NO oxygen req on d/c  3. Anemia-hemoglobin is 9.5-stable at thistime  4. Mild hyponatremia-resolved, patient presented with mild hyponatremia with sodium 133, improved to= 137  5. Hypokalemia-repleted-    Discharge Exam: Vitals:   12/31/18 0553 12/31/18 0833  BP: (!) 161/89 (!) 155/88  Pulse: 79 96  Resp: 15 16  Temp: 98.1 F (36.7 C) 98 F (36.7 C)  SpO2: 96% 98%    General: eomi ncat, no pallor no ict Cardiovascular:  s1 s 2no m/r/g Respiratory: clear no added sound no rales no rhonchi abd soft nt nd  Discharge Instructions   Discharge Instructions    Diet - low sodium heart healthy   Complete by:  As directed    Discharge instructions   Complete by:   As directed    Complete 2 more days of Levaquin You will need significant weight loss as an outpatient as we discussed-I think if you are motivated to lose 20 or 30 pounds you will probably not need as many blood pressure medications and should be able to do a lot more activity without having aches and pains I would recommend that you get a primary care physician I would recommend that you get labs within about 1 week because you are on a new blood pressure medication I would recommend also that you increase the amount of activity due to above 10,000 steps We will ask the case manager to assist you in getting a primary care physician but you do need to be seen once again within the next 14 days   Increase activity slowly   Complete by:  As directed      Allergies as of 12/31/2018   No Known Allergies     Medication List    STOP taking these medications   dextromethorphan-guaiFENesin 30-600 MG 12hr tablet Commonly known as:  MUCINEX DM     TAKE these medications   acetaminophen 500 MG tablet Commonly known as:  TYLENOL Take 1,000 mg by mouth every 6 (six) hours as needed for mild pain.   amLODipine 10 MG tablet Commonly known as:  NORVASC Take 1 tablet (10 mg total) by mouth daily. Start taking on:  January 01, 2019   benzonatate 100 MG capsule Commonly known as:  TESSALON Take 100 mg by mouth  3 (three) times daily as needed for cough.   hydrochlorothiazide 25 MG tablet Commonly known as:  HYDRODIURIL Take 1 tablet (25 mg total) by mouth daily.   levofloxacin 500 MG tablet Commonly known as:  LEVAQUIN Take 1 tablet (500 mg total) by mouth daily.      No Known Allergies    The results of significant diagnostics from this hospitalization (including imaging, microbiology, ancillary and laboratory) are listed below for reference.    Significant Diagnostic Studies: Dg Chest 2 View  Result Date: 12/31/2018 CLINICAL DATA:  43 year old female with cough. Negative for  COVID-19 on 12/27/2018. EXAM: CHEST - 2 VIEW COMPARISON:  12/27/2018 and earlier. FINDINGS: Stable somewhat low lung volumes. Mediastinal contours remain normal. Visualized tracheal air column is within normal limits. Regressed streaky in confluent bilateral lower lung opacity with residual bilateral increased interstitial markings. No pneumothorax, pleural effusion, or areas of worsening ventilation. No acute osseous abnormality identified. Negative visible bowel gas pattern. IMPRESSION: 1. Regressed bilateral lower lung pneumonia since 12/27/2018 with residual bilateral increased interstitial markings. 2. No new cardiopulmonary abnormality. Electronically Signed   By: Odessa FlemingH  Hall M.D.   On: 12/31/2018 09:08   Dg Chest 2 View  Result Date: 12/27/2018 CLINICAL DATA:  Acute shortness of breath, cough and fever for 1 week. EXAM: CHEST - 2 VIEW COMPARISON:  12/26/2018 FINDINGS: Bilateral LOWER lung airspace opacities are identified. Mild cardiomegaly noted. No pleural effusion or pneumothorax. No acute bony abnormalities are identified. IMPRESSION: Bilateral LOWER lung airspace opacities likely representing pneumonia. Consider short-term radiographic follow-up to resolution. Electronically Signed   By: Harmon PierJeffrey  Hu M.D.   On: 12/27/2018 13:18   Dg Chest Portable 1 View  Result Date: 12/26/2018 CLINICAL DATA:  Shortness of breath.  Cough for 6 days. EXAM: PORTABLE CHEST 1 VIEW COMPARISON:  None. FINDINGS: The study is limited due to low volume portable technique. No pneumothorax. Mild opacity in the medial right lung base. Lungs are otherwise clear. Suspected cardiomegaly, poorly evaluated due to technique. No other acute abnormalities. IMPRESSION: 1. The study is limited due to low volume portable technique. Mild opacity in the medial right lung base is not excluded. Recommend a better volume PA and lateral chest x-ray. 2. No other acute abnormalities. Electronically Signed   By: Gerome Samavid  Williams III M.D   On:  12/26/2018 12:49    Microbiology: Recent Results (from the past 240 hour(s))  Blood culture (routine x 2)     Status: None   Collection Time: 12/26/18 12:46 PM  Result Value Ref Range Status   Specimen Description   Final    BLOOD LEFT ANTECUBITAL Performed at [facility], [address] [place], [place] [address]    Special Requests   Final    BOTTLES DRAWN AEROBIC AND ANAEROBIC Blood Culture adequate volume Performed at [facility], [address] [place], [place] [address]    Culture   Final    NO GROWTH 5 DAYS Performed at [facility], [address] [place], [place] [address]    Report Status 12/31/2018 FINAL  Final  SARS Coronavirus 2 [facility](Hospital order, Performed in [facility] hospital lab)     Status: None   Collection Time: 12/26/18 12:46 PM  Result Value Ref Range Status   SARS Coronavirus 2 NEGATIVE NEGATIVE Final    Comment: (NOTE) If result is NEGATIVE SARS-CoV-2 target nucleic acids are NOT DETECTED. The SARS-CoV-2 RNA is generally detectable in upper and lower  respiratory specimens  during the acute phase of infection. The lowest  concentration of SARS-CoV-2 viral copies this assay can detect is 250  copies / mL. A negative result does not preclude SARS-CoV-2 infection  and should not be used as the sole basis for treatment or other  patient management decisions.  A negative result may occur with  improper specimen collection / handling, submission of specimen other  than nasopharyngeal swab, presence of viral mutation(s) within the  areas targeted by this assay, and inadequate number of viral copies  (<250 copies / mL). A negative result must be combined with clinical  observations, patient history, and epidemiological information. If result is POSITIVE SARS-CoV-2 target nucleic acids are DETECTED. The SARS-CoV-2 RNA is generally detectable in upper and lower  respiratory specimens dur ing the acute phase of infection.   Positive  results are indicative of active infection with SARS-CoV-2.  Clinical  correlation with patient history and other diagnostic information is  necessary to determine patient infection status.  Positive results do  not rule out bacterial infection or co-infection with other viruses. If result is PRESUMPTIVE POSTIVE SARS-CoV-2 nucleic acids MAY BE PRESENT.   A presumptive positive result was obtained on the submitted specimen  and confirmed on repeat testing.  While 2019 novel coronavirus  (SARS-CoV-2) nucleic acids may be present in the submitted sample  additional confirmatory testing may be necessary for epidemiological  and / or clinical management purposes  to differentiate between  SARS-CoV-2 and other Sarbecovirus currently known to infect humans.  If clinically indicated additional testing with an alternate test  methodology [phone]) is advised. The SARS-CoV-2 RNA is generally  detectable in upper and lower respiratory sp ecimens during the acute  phase of infection. The expected result is Negative. Fact Sheet for Patients:  BoilerBrush.com.cy Fact Sheet for Healthcare Providers: https://pope.com/ This test is not yet approved or cleared by the [place] FDA and has been authorized for detection and/or diagnosis of SARS-CoV-2 by FDA under an Emergency Use Authorization (EUA).  This EUA will remain in effect (meaning this test can be used) for the duration of the COVID-19 declaration under Section 564(b)(1) of the Act, 21 U.S.C. section 360bbb-3(b)(1), unless the authorization is terminated or revoked sooner. Performed at [facility], [address], [place], [place] [address]   Blood culture (routine x 2)     Status: None   Collection Time: 12/26/18 12:51 PM  Result Value Ref Range Status   Specimen Description   Final    BLOOD LEFT ANTECUBITAL Performed at [facility], [address] [place], [place] [address]    Special Requests   Final    BOTTLES DRAWN AEROBIC AND ANAEROBIC Blood Culture adequate volume Performed at [facility], [address] [place], [place] [address]    Culture   Final    NO GROWTH 5 DAYS Performed at [facility] [place] [facility], [address] [place], [place] [address]    Report Status 12/31/2018 FINAL  Final  SARS Coronavirus 2 [facility] order, Performed in [facility] hospital lab)     Status: None   Collection Time: 12/27/18  6:09 AM  Result Value Ref Range Status   SARS Coronavirus 2 NEGATIVE NEGATIVE Final    Comment: (NOTE) If result is NEGATIVE SARS-CoV-2 target nucleic acids are NOT DETECTED. The SARS-CoV-2 RNA is generally detectable in upper and lower  respiratory specimens during the acute phase of infection. The lowest  concentration of SARS-CoV-2 viral copies  this assay can detect is 250  copies / mL. A negative result does not preclude SARS-CoV-2 infection  and should not be used as the sole basis for treatment or other  patient management decisions.  A negative result may occur with  improper specimen collection / handling, submission of specimen other  than nasopharyngeal swab, presence of viral mutation(s) within the  areas targeted by this assay, and inadequate number of viral copies  (<250 copies / mL). A negative result must be combined with clinical  observations, patient history, and epidemiological information. If result is POSITIVE SARS-CoV-2 target nucleic acids are DETECTED. The SARS-CoV-2 RNA is generally detectable in upper and lower  respiratory specimens dur ing the acute phase of infection.  Positive  results are indicative of active infection with SARS-CoV-2.  Clinical  correlation with patient history and other diagnostic information is  necessary to determine patient infection status.  Positive results do  not rule out bacterial infection or co-infection with other viruses. If result is  PRESUMPTIVE POSTIVE SARS-CoV-2 nucleic acids MAY BE PRESENT.   A presumptive positive result was obtained on the submitted specimen  and confirmed on repeat testing.  While 2019 novel coronavirus  (SARS-CoV-2) nucleic acids may be present in the submitted sample  additional confirmatory testing may be necessary for epidemiological  and / or clinical management purposes  to differentiate between  SARS-CoV-2 and other Sarbecovirus currently known to infect humans.  If clinically indicated additional testing with an alternate test  methodology [phone]) is advised. The SARS-CoV-2 RNA is generally  detectable in upper and lower respiratory sp ecimens during the acute  phase of infection. The expected result is Negative. Fact Sheet for Patients:  BoilerBrush.com.cy Fact Sheet for Healthcare Providers: https://pope.com/ This test is not yet approved or cleared by the [place] FDA and has been authorized for detection and/or diagnosis of SARS-CoV-2 by FDA under an Emergency Use Authorization (EUA).  This EUA will remain in effect (meaning this test can be used) for the duration of the COVID-19 declaration under Section 564(b)(1) of the Act, 21 U.S.C. section 360bbb-3(b)(1), unless the authorization is terminated or revoked sooner. Performed at [facility], [address], [place], [place] [address]      Labs: Basic Metabolic Panel: Recent Labs  Lab 12/26/18 1154 12/27/18 0346 12/28/18 0456 12/30/18 0513 12/31/18 0447  NA 133* 136 135 137 137  K 3.4* 3.3* 3.7 3.4* 4.3  CL 99 104 102 103 103  CO2 24 22 23 26 26   GLUCOSE 136* 141* 133* 130* 134*  BUN 7 6 5* 5* 6  CREATININE 0.77 0.66 0.62 0.59 0.63  CALCIUM 8.3* 7.6* 8.1* 8.2* 8.6*  PHOS  --   --   --   --  2.8   Liver Function Tests: Recent Labs  Lab 12/26/18 1154 12/27/18 0346 12/28/18 0456 12/30/18 0513 12/31/18 0447  AST 56* 55* 43* 99*  --    ALT 78* 69* 67* 93*  --   ALKPHOS 94 87 97 117  --   BILITOT 4.0* 3.2* 2.0* 1.1  --   PROT 8.1 6.9 7.5 7.1  --   ALBUMIN 3.5 2.9* 3.0* 2.8* 2.9*   No results for input(s): LIPASE, AMYLASE in the last 168 hours. No results for input(s): AMMONIA in the last 168 hours. CBC: Recent Labs  Lab 12/26/18 1154 12/27/18 0346 12/28/18 0456 12/29/18 0457 12/30/18 0513 12/31/18 0447  WBC 11.6* 10.7* 13.7* 13.0* 10.7* 9.8  NEUTROABS 8.4* 7.9*  --   --   --  5.9  HGB 11.1* 10.4* 10.3* 9.5* 9.5* 9.8*  HCT 35.0* 34.6* 32.4* 30.6* 31.2* 32.4*  MCV 84.3 87.2 86.4 87.2 88.4 89.0  PLT 325 291 353 389 436* 470*   Cardiac Enzymes: No results for input(s): CKTOTAL, CKMB, CKMBINDEX, TROPONINI in the last 168 hours. BNP: BNP (last 3 results) No results for input(s): BNP in the last 8760 hours.  ProBNP (last 3 results) No results for input(s): PROBNP in the last 8760 hours.  CBG: No results for input(s): GLUCAP in the last 168 hours.     Signed:  Rhetta Mura MD   [facility] 12/31/2018, 12:33 PM

## 2019-01-11 ENCOUNTER — Ambulatory Visit: Payer: Self-pay | Admitting: Family Medicine

## 2019-01-13 ENCOUNTER — Other Ambulatory Visit: Payer: Self-pay

## 2019-01-13 ENCOUNTER — Ambulatory Visit (INDEPENDENT_AMBULATORY_CARE_PROVIDER_SITE_OTHER): Payer: Self-pay | Admitting: Family Medicine

## 2019-01-13 ENCOUNTER — Encounter: Payer: Self-pay | Admitting: Family Medicine

## 2019-01-13 VITALS — BP 146/94 | HR 110 | Temp 98.4°F | Ht 67.0 in | Wt 290.0 lb

## 2019-01-13 DIAGNOSIS — R7303 Prediabetes: Secondary | ICD-10-CM

## 2019-01-13 DIAGNOSIS — R059 Cough, unspecified: Secondary | ICD-10-CM | POA: Insufficient documentation

## 2019-01-13 DIAGNOSIS — Z7689 Persons encountering health services in other specified circumstances: Secondary | ICD-10-CM

## 2019-01-13 DIAGNOSIS — Z09 Encounter for follow-up examination after completed treatment for conditions other than malignant neoplasm: Secondary | ICD-10-CM

## 2019-01-13 DIAGNOSIS — Z0000 Encounter for general adult medical examination without abnormal findings: Secondary | ICD-10-CM

## 2019-01-13 DIAGNOSIS — D649 Anemia, unspecified: Secondary | ICD-10-CM | POA: Insufficient documentation

## 2019-01-13 DIAGNOSIS — J189 Pneumonia, unspecified organism: Secondary | ICD-10-CM

## 2019-01-13 DIAGNOSIS — Z131 Encounter for screening for diabetes mellitus: Secondary | ICD-10-CM

## 2019-01-13 DIAGNOSIS — R748 Abnormal levels of other serum enzymes: Secondary | ICD-10-CM | POA: Insufficient documentation

## 2019-01-13 DIAGNOSIS — Z6841 Body Mass Index (BMI) 40.0 and over, adult: Secondary | ICD-10-CM

## 2019-01-13 DIAGNOSIS — E876 Hypokalemia: Secondary | ICD-10-CM

## 2019-01-13 DIAGNOSIS — E66813 Obesity, class 3: Secondary | ICD-10-CM | POA: Insufficient documentation

## 2019-01-13 DIAGNOSIS — R05 Cough: Secondary | ICD-10-CM

## 2019-01-13 DIAGNOSIS — R7309 Other abnormal glucose: Secondary | ICD-10-CM | POA: Insufficient documentation

## 2019-01-13 LAB — POCT URINALYSIS DIP (MANUAL ENTRY)
Bilirubin, UA: NEGATIVE
Glucose, UA: 500 mg/dL — AB
Leukocytes, UA: NEGATIVE
Nitrite, UA: NEGATIVE
Protein Ur, POC: NEGATIVE mg/dL
Spec Grav, UA: 1.02 (ref 1.010–1.025)
Urobilinogen, UA: 0.2 E.U./dL
pH, UA: 5.5 (ref 5.0–8.0)

## 2019-01-13 LAB — POCT GLYCOSYLATED HEMOGLOBIN (HGB A1C): Hemoglobin A1C: 6.6 % — AB (ref 4.0–5.6)

## 2019-01-13 LAB — GLUCOSE, POCT (MANUAL RESULT ENTRY): POC Glucose: 315 mg/dl — AB (ref 70–99)

## 2019-01-13 MED ORDER — BLOOD GLUCOSE METER KIT: PACK | 0 refills | Status: AC

## 2019-01-13 NOTE — Progress Notes (Signed)
Patient [facility]   New Patient--Hospital Follow Up--Establish Care  Subjective:  Patient ID: Valerie Moss, female    DOB: 1975/10/16  Age: 43 y.o. MRN: [id number]  CC:  Chief Complaint  Patient presents with  . Establish Care  . Hospitalization Follow-up    pnemounia   . rib pain    Left    HPI Valerie Moss is a 43 year old female who presents for Hospital Follow Up and to Establish Care today.   Past Medical History:  Diagnosis Date  . Anemia   . Arrhythmia   . Cough   . Elevated liver enzymes   . Hypertension   . Hypocalcemia   . Hypokalemia   . Obese   . Pneumonia    Current Status: Since her last Hospital Admission on 12/26/2018-12/31/2018 for Sepsis due to Pneumonia, she is doing well with no complaints. She admits that she has not had regular PCP and has been lost to follow up. 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 She has c/o left flank area, which she has had since her discharge from hospital on 12/31/2018. She states that pain has remained stable. She states that she continues to have increased fatigue. She denies visual changes, chest pain, shortness of breath, heart palpitations, and falls. She has occasional headaches and dizziness with position changes. Denies severe headaches, confusion, seizures, double vision, and blurred vision, nausea and vomiting.  She any recent denies fevers, chills, weight loss, and night sweats. No reports of GI problems such as diarrhea, and constipation. She has no reports of blood in stools, dysuria and hematuria. No depression or anxiety reported. She denies pain today.   No past surgical history on file.  Family History  Problem Relation Age of Onset  . Hypertension Mother     Social History   Socioeconomic History  . Marital status: Single    Spouse name: Not on file  . Number of children: Not on file  . Years of education: Not  on file  . Highest education level: Not on file  Occupational History  . Not on file  Social Needs  . Financial resource strain: Not on file  . Food insecurity:    Worry: Not on file    Inability: Not on file  . Transportation needs:    Medical: Not on file    Non-medical: Not on file  Tobacco Use  . Smoking status: Never Smoker  . Smokeless tobacco: Never Used  Substance and Sexual Activity  . Alcohol use: Never    Frequency: Never  . Drug use: Never  . Sexual activity: Not on file  Lifestyle  . Physical activity:    Days per week: Not on file    Minutes per session: Not on file  . Stress: Not on file  Relationships  . Social connections:    Talks on phone: Not on file    Gets together: Not on file    Attends religious service: Not on file    Active member of club or organization: Not on file    Attends meetings of clubs or organizations: Not on file    Relationship status: Not on file  . Intimate partner violence:    Fear of current or ex partner: Not on file    Emotionally abused: Not on file    Physically abused: Not on file    Forced sexual activity: Not on file  Other Topics Concern  . Not on file  Social History Narrative  . Not on file    Outpatient Medications Prior to Visit  Medication Sig Dispense Refill  . amLODipine (NORVASC) 10 MG tablet Take 1 tablet (10 mg total) by mouth daily. 30 tablet 3  . benzonatate (TESSALON) 100 MG capsule Take 100 mg by mouth 3 (three) times daily as needed for cough.    . hydrochlorothiazide (HYDRODIURIL) 25 MG tablet Take 1 tablet (25 mg total) by mouth daily. 30 tablet 3  . acetaminophen (TYLENOL) 500 MG tablet Take 1,000 mg by mouth every 6 (six) hours as needed for mild pain.    Marland Kitchen albuterol (VENTOLIN HFA) 108 (90 Base) MCG/ACT inhaler 2 puffs.    Marland Kitchen levofloxacin (LEVAQUIN) 500 MG tablet Take 1 tablet (500 mg total) by mouth daily. (Patient not taking: Reported on 01/13/2019) 2 tablet 0   No facility-administered  medications prior to visit.     No Known Allergies  ROS Review of Systems  Constitutional: Negative.   HENT: Negative.   Eyes: Negative.   Respiratory: Positive for cough (Occasional).   Cardiovascular: Negative.   Gastrointestinal: Positive for abdominal distention (Obese).  Endocrine: Negative.   Genitourinary: Negative.   Musculoskeletal: Negative.   Skin: Negative.   Allergic/Immunologic: Negative.   Neurological: Positive for dizziness and headaches (Occasional ).  Hematological: Negative.   Psychiatric/Behavioral: Negative.    Objective:    Physical Exam  Constitutional: She is oriented to person, place, and time. She appears well-developed and well-nourished.  HENT:  Head: Normocephalic and atraumatic.  Eyes: Conjunctivae are normal.  Neck: Normal range of motion. Neck supple.  Cardiovascular: Normal rate, regular rhythm, normal heart sounds and intact distal pulses.  Pulmonary/Chest: Effort normal and breath sounds normal.  Abdominal: Soft. Bowel sounds are normal.  Musculoskeletal: Normal range of motion.  Neurological: She is alert and oriented to person, place, and time. She has normal reflexes.  Skin: Skin is warm and dry.  Psychiatric: She has a normal mood and affect. Her behavior is normal. Judgment and thought content normal.    BP (!) 146/94 (BP Location: Right Arm, Patient Position: Sitting, Cuff Size: Large)   Pulse (!) 110   Temp 98.4 F (36.9 C) (Oral)   Ht 5' 7"  (1.702 m)   Wt 290 lb (131.5 kg)   LMP 12/25/2018 (Approximate) Comment: IUD  SpO2 98%   BMI 45.42 kg/m  Wt Readings from Last 3 Encounters:  01/13/19 290 lb (131.5 kg)  12/26/18 297 lb 12.8 oz (135.1 kg)    Health Maintenance Due  Topic Date Due  . HIV Screening  07/23/1992  . TETANUS/TDAP  07/23/1996  . PAP SMEAR-Modifier  07/23/1998    There are no preventive care reminders to display for this patient.  No results found for: TSH Lab Results  Component Value Date   WBC  9.8 12/31/2018   HGB 9.8 (L) 12/31/2018   HCT 32.4 (L) 12/31/2018   MCV 89.0 12/31/2018   PLT 470 (H) 12/31/2018   Lab Results  Component Value Date   NA 137 12/31/2018   K 4.3 12/31/2018   CO2 26 12/31/2018   GLUCOSE 134 (H) 12/31/2018   BUN 6 12/31/2018   CREATININE 0.63 12/31/2018   BILITOT 1.1 12/30/2018   ALKPHOS 117 12/30/2018   AST 99 (H) 12/30/2018   ALT 93 (H) 12/30/2018   PROT 7.1 12/30/2018   ALBUMIN 2.9 (L) 12/31/2018   CALCIUM 8.6 (L) 12/31/2018   ANIONGAP  8 12/31/2018   No results found for: CHOL No results found for: HDL No results found for: LDLCALC No results found for: TRIG No results found for: [facility] Lab Results  Component Value Date   HGBA1C 6.6 (A) 01/13/2019   Assessment & Plan:   1. Hospital discharge follow-up  2. Encounter to establish care  3. Pneumonia due to infectious organism, unspecified laterality, unspecified part of lung Stable. No signs or symptoms of respiratory distress noted or reported.   4. Cough Occasional. We will continue to monitor.  5. Class 3 severe obesity due to excess calories with serious comorbidity and body mass index (BMI) of 45.0 to 49.9 in adult [facility]) Body mass index is 45.42 kg/m. Goal BMI  is <30. Encouraged efforts to reduce weight include engaging in physical activity as tolerated with goal of 150 minutes per week. Improve dietary choices and eat a meal regimen consistent with a Mediterranean or DASH diet. Reduce simple carbohydrates. Do not skip meals and eat healthy snacks throughout the day to avoid over-eating at dinner. Set a goal weight loss that is achievable for you.  6. Screening for diabetes mellitus Hgb A1c is stable at 6.6 today.  - POCT glycosylated hemoglobin (Hb A1C) - POCT urinalysis dipstick  7. Prediabetes She will check blood glucose levels daily.  - blood glucose meter kit and supplies; Dispense based on patient and insurance preference. Use up to four times daily as directed. (FOR  ICD-10 E10.9, E11.9).  Dispense: 1 each; Refill: 0  8. Elevated glucose Last blood glucose level at 130 on 12/30/2018. - POCT glucose (manual entry)  9. Elevated liver enzymes Last AST/ALT level at 99/93 on 12/30/2018.  10. Anemia, unspecified type Last Hgb level at 9.8 on 12/31/2018  11. Hypokalemia Last K+ level at 3.4 on 12/30/2018.  12. Hypocalcemia Last Calcium level at 8.2 on 12/30/2018.   13. Healthcare maintenance - CBC with Differential - Comprehensive metabolic panel - Lipid Panel - TSH - Vitamin B12 - Vitamin D, 25-hydroxy  14. Follow up We will re-assess blood glucose levels at next office visit. Work excuse written today.  Meds ordered this encounter  Medications  . blood glucose meter kit and supplies    Sig: Dispense based on patient and insurance preference. Use up to four times daily as directed. (FOR ICD-10 E10.9, E11.9).    Dispense:  1 each    Refill:  0    Order Specific Question:   Number of strips    Answer:   100    Order Specific Question:   Number of lancets    Answer:   100    Orders Placed This Encounter  Procedures  . CBC with Differential  . Comprehensive metabolic panel  . Lipid Panel  . TSH  . Vitamin B12  . Vitamin D, 25-hydroxy  . POCT glycosylated hemoglobin (Hb A1C)  . POCT urinalysis dipstick  . POCT glucose (manual entry)    Referral Orders  No referral(s) requested today    Kathe Becton,  MSN, FNP-C Patient [address] [place], [place] [address] [phone]  Problem List Items Addressed This Visit      Respiratory   Pneumonia   Relevant Medications   albuterol (VENTOLIN HFA) 108 (90 Base) MCG/ACT inhaler     Other   Hypokalemia    Other Visit Diagnoses    Screening for diabetes mellitus    -  Primary   Relevant Orders  POCT glycosylated hemoglobin (Hb A1C) (Completed)   POCT urinalysis dipstick (Completed)   Hospital discharge follow-up       Encounter to  establish care       Cough       Class 3 severe obesity due to excess calories with serious comorbidity and body mass index (BMI) of 45.0 to 49.9 in adult [facility])       Prediabetes       Relevant Medications   blood glucose meter kit and supplies   Elevated glucose       Relevant Orders   POCT glucose (manual entry) (Completed)   Elevated liver enzymes       Anemia, unspecified type       Hypocalcemia       Healthcare maintenance       Relevant Orders   CBC with Differential   Comprehensive metabolic panel   Lipid Panel   TSH   Vitamin B12   Vitamin D, 25-hydroxy   Follow up          Meds ordered this encounter  Medications  . blood glucose meter kit and supplies    Sig: Dispense based on patient and insurance preference. Use up to four times daily as directed. (FOR ICD-10 E10.9, E11.9).    Dispense:  1 each    Refill:  0    Order Specific Question:   Number of strips    Answer:   100    Order Specific Question:   Number of lancets    Answer:   100    Follow-up: Return in about 1 month (around 02/13/2019).    Azzie Glatter, FNP

## 2019-01-13 NOTE — Patient Instructions (Addendum)
Calorie Counting for Weight Loss Calories are units of energy. Your body needs a certain amount of calories from food to keep you going throughout the day. When you eat more calories than your body needs, your body stores the extra calories as fat. When you eat fewer calories than your body needs, your body burns fat to get the energy it needs. Calorie counting means keeping track of how many calories you eat and drink each day. Calorie counting can be helpful if you need to lose weight. If you make sure to eat fewer calories than your body needs, you should lose weight. Ask your health care provider what a healthy weight is for you. For calorie counting to work, you will need to eat the right number of calories in a day in order to lose a healthy amount of weight per week. A dietitian can help you determine how many calories you need in a day and will give you suggestions on how to reach your calorie goal.  A healthy amount of weight to lose per week is usually 1-2 lb (0.5-0.9 kg). This usually means that your daily calorie intake should be reduced by 500-750 calories.  Eating 1,200 - 1,500 calories per day can help most women lose weight.  Eating 1,500 - 1,800 calories per day can help most men lose weight. What is my plan? My goal is to have __________ calories per day. If I have this many calories per day, I should lose around __________ pounds per week. What do I need to know about calorie counting? In order to meet your daily calorie goal, you will need to:  Find out how many calories are in each food you would like to eat. Try to do this before you eat.  Decide how much of the food you plan to eat.  Write down what you ate and how many calories it had. Doing this is called keeping a food log. To successfully lose weight, it is important to balance calorie counting with a healthy lifestyle that includes regular activity. Aim for 150 minutes of moderate exercise (such as walking) or 75  minutes of vigorous exercise (such as running) each week. Where do I find calorie information?  The number of calories in a food can be found on a Nutrition Facts label. If a food does not have a Nutrition Facts label, try to look up the calories online or ask your dietitian for help. Remember that calories are listed per serving. If you choose to have more than one serving of a food, you will have to multiply the calories per serving by the amount of servings you plan to eat. For example, the label on a package of bread might say that a serving size is 1 slice and that there are 90 calories in a serving. If you eat 1 slice, you will have eaten 90 calories. If you eat 2 slices, you will have eaten 180 calories. How do I keep a food log? Immediately after each meal, record the following information in your food log:  What you ate. Don't forget to include toppings, sauces, and other extras on the food.  How much you ate. This can be measured in cups, ounces, or number of items.  How many calories each food and drink had.  The total number of calories in the meal. Keep your food log near you, such as in a small notebook in your pocket, or use a mobile app or website. Some programs will calculate  calories for you and show you how many calories you have left for the day to meet your goal. °What are some calorie counting tips? ° °· Use your calories on foods and drinks that will fill you up and not leave you hungry: °? Some examples of foods that fill you up are nuts and nut butters, vegetables, lean proteins, and high-fiber foods like whole grains. High-fiber foods are foods with more than 5 g fiber per serving. °? Drinks such as sodas, specialty coffee drinks, alcohol, and juices have a lot of calories, yet do not fill you up. °· Eat nutritious foods and avoid empty calories. Empty calories are calories you get from foods or beverages that do not have many vitamins or protein, such as candy, sweets, and  soda. It is better to have a nutritious high-calorie food (such as an avocado) than a food with few nutrients (such as a bag of chips). °· Know how many calories are in the foods you eat most often. This will help you calculate calorie counts faster. °· Pay attention to calories in drinks. Low-calorie drinks include water and unsweetened drinks. °· Pay attention to nutrition labels for "low fat" or "fat free" foods. These foods sometimes have the same amount of calories or more calories than the full fat versions. They also often have added sugar, starch, or salt, to make up for flavor that was removed with the fat. °· Find a way of tracking calories that works for you. Get creative. Try different apps or programs if writing down calories does not work for you. °What are some portion control tips? °· Know how many calories are in a serving. This will help you know how many servings of a certain food you can have. °· Use a measuring cup to measure serving sizes. You could also try weighing out portions on a kitchen scale. With time, you will be able to estimate serving sizes for some foods. °· Take some time to put servings of different foods on your favorite plates, bowls, and cups so you know what a serving looks like. °· Try not to eat straight from a bag or box. Doing this can lead to overeating. Put the amount you would like to eat in a cup or on a plate to make sure you are eating the right portion. °· Use smaller plates, glasses, and bowls to prevent overeating. °· Try not to multitask (for example, watch TV or use your computer) while eating. If it is time to eat, sit down at a table and enjoy your food. This will help you to know when you are full. It will also help you to be aware of what you are eating and how much you are eating. °What are tips for following this plan? °Reading food labels °· Check the calorie count compared to the serving size. The serving size may be smaller than what you are used to  eating. °· Check the source of the calories. Make sure the food you are eating is high in vitamins and protein and low in saturated and trans fats. °Shopping °· Read nutrition labels while you shop. This will help you make healthy decisions before you decide to purchase your food. °· Make a grocery list and stick to it. °Cooking °· Try to cook your favorite foods in a healthier way. For example, try baking instead of frying. °· Use low-fat dairy products. °Meal planning °· Use more fruits and vegetables. Half of your plate should be fruits   and vegetables.  Include lean proteins like poultry and fish. How do I count calories when eating out?  Ask for smaller portion sizes.  Consider sharing an entree and sides instead of getting your own entree.  If you get your own entree, eat only half. Ask for a box at the beginning of your meal and put the rest of your entree in it so you are not tempted to eat it.  If calories are listed on the menu, choose the lower calorie options.  Choose dishes that include vegetables, fruits, whole grains, low-fat dairy products, and lean protein.  Choose items that are boiled, broiled, grilled, or steamed. Stay away from items that are buttered, battered, fried, or served with cream sauce. Items labeled "crispy" are usually fried, unless stated otherwise.  Choose water, low-fat milk, unsweetened iced tea, or other drinks without added sugar. If you want an alcoholic beverage, choose a lower calorie option such as a glass of wine or light beer.  Ask for dressings, sauces, and syrups on the side. These are usually high in calories, so you should limit the amount you eat.  If you want a salad, choose a garden salad and ask for grilled meats. Avoid extra toppings like bacon, cheese, or fried items. Ask for the dressing on the side, or ask for olive oil and vinegar or lemon to use as dressing.  Estimate how many servings of a food you are given. For example, a serving of  cooked rice is  cup or about the size of half a baseball. Knowing serving sizes will help you be aware of how much food you are eating at restaurants. The list below tells you how big or small some common portion sizes are based on everyday objects: ? 1 oz--4 stacked dice. ? 3 oz--1 deck of cards. ? 1 tsp--1 die. ? 1 Tbsp-- a ping-pong ball. ? 2 Tbsp--1 ping-pong ball. ?  cup-- baseball. ? 1 cup--1 baseball. Summary  Calorie counting means keeping track of how many calories you eat and drink each day. If you eat fewer calories than your body needs, you should lose weight.  A healthy amount of weight to lose per week is usually 1-2 lb (0.5-0.9 kg). This usually means reducing your daily calorie intake by 500-750 calories.  The number of calories in a food can be found on a Nutrition Facts label. If a food does not have a Nutrition Facts label, try to look up the calories online or ask your dietitian for help.  Use your calories on foods and drinks that will fill you up, and not on foods and drinks that will leave you hungry.  Use smaller plates, glasses, and bowls to prevent overeating. This information is not intended to replace advice given to you by your health care provider. Make sure you discuss any questions you have with your health care provider. Document Released: 08/26/2005 Document Revised: 05/15/2018 Document Reviewed: 07/26/2016 Elsevier Interactive Patient Education  2019 Else[employer] Inc. Preventing Type 2 Diabetes Mellitus Type 2 diabetes (type 2 diabetes mellitus) is a long-term (chronic) disease that affects blood sugar (glucose) levels. Normally, a hormone called insulin allows glucose to enter cells in the body. The cells use glucose for energy. In type 2 diabetes, one or both of these problems may be present:  The body does not make enough insulin.  The body does not respond properly to insulin that it makes (insulin resistance). Insulin resistance or lack of insulin  causes excess glucose to  build up in the blood instead of going into cells. As a result, high blood glucose (hyperglycemia) develops, which can cause many complications. Being overweight or obese and having an inactive (sedentary) lifestyle can increase your risk for diabetes. Type 2 diabetes can be delayed or prevented by making certain nutrition and lifestyle changes. What nutrition changes can be made?   Eat healthy meals and snacks regularly. Keep a healthy snack with you for when you get hungry between meals, such as fruit or a handful of nuts.  Eat lean meats and proteins that are low in saturated fats, such as chicken, fish, egg whites, and beans. Avoid processed meats.  Eat plenty of fruits and vegetables and plenty of grains that have not been processed (whole grains). It is recommended that you eat: ? 1?2 cups of fruit every day. ? 2?3 cups of vegetables every day. ? 6?8 oz of whole grains every day, such as oats, whole wheat, bulgur, brown rice, quinoa, and millet.  Eat low-fat dairy products, such as milk, yogurt, and cheese.  Eat foods that contain healthy fats, such as nuts, avocado, olive oil, and canola oil.  Drink water throughout the day. Avoid drinks that contain added sugar, such as soda or sweet tea.  Follow instructions from your health care provider about specific eating or drinking restrictions.  Control how much food you eat at a time (portion size). ? Check food labels to find out the serving sizes of foods. ? Use a kitchen scale to weigh amounts of foods.  Saute or steam food instead of frying it. Cook with water or broth instead of oils or butter.  Limit your intake of: ? Salt (sodium). Have no more than 1 tsp (2,400 mg) of sodium a day. If you have heart disease or high blood pressure, have less than ? tsp (1,500 mg) of sodium a day. ? Saturated fat. This is fat that is solid at room temperature, such as butter or fat on meat. What lifestyle changes can  be made? Activity   Do moderate-intensity physical activity for at least 30 minutes on at least 5 days of the week, or as much as told by your health care provider.  Ask your health care provider what activities are safe for you. A mix of physical activities may be best, such as walking, swimming, cycling, and strength training.  Try to add physical activity into your day. For example: ? Park in spots that are farther away than usual, so that you walk more. For example, park in a far corner of the parking lot when you go to the office or the grocery store. ? Take a walk during your lunch break. ? Use stairs instead of elevators or escalators. Weight Loss  Lose weight as directed. Your health care provider can determine how much weight loss is best for you and can help you lose weight safely.  If you are overweight or obese, you may be instructed to lose at least 5?7 % of your body weight. Alcohol and Tobacco   Limit alcohol intake to no more than 1 drink a day for nonpregnant women and 2 drinks a day for men. One drink equals 12 oz of beer, 5 oz of wine, or 1 oz of hard liquor.  Do not use any tobacco products, such as cigarettes, chewing tobacco, and e-cigarettes. If you need help quitting, ask your health care provider. Work With Your Health Care Provider  Have your blood glucose tested regularly, as told by  your health care provider.  Discuss your risk factors and how you can reduce your risk for diabetes.  Get screening tests as told by your health care provider. You may have screening tests regularly, especially if you have certain risk factors for type 2 diabetes.  Make an appointment with a diet and nutrition specialist (registered dietitian). A registered dietitian can help you make a healthy eating plan and can help you understand portion sizes and food labels. Why are these changes important?  It is possible to prevent or delay type 2 diabetes and related health problems  by making lifestyle and nutrition changes.  It can be difficult to recognize signs of type 2 diabetes. The best way to avoid possible damage to your body is to take actions to prevent the disease before you develop symptoms. What can happen if changes are not made?  Your blood glucose levels may keep increasing. Having high blood glucose for a long time is dangerous. Too much glucose in your blood can damage your blood vessels, heart, kidneys, nerves, and eyes.  You may develop prediabetes or type 2 diabetes. Type 2 diabetes can lead to many chronic health problems and complications, such as: ? Heart disease. ? Stroke. ? Blindness. ? Kidney disease. ? Depression. ? Poor circulation in the feet and legs, which could lead to surgical removal (amputation) in severe cases. Where to find support  Ask your health care provider to recommend a registered dietitian, diabetes educator, or weight loss program.  Look for local or online weight loss groups.  Join a gym, fitness club, or outdoor activity group, such as a walking club. Where to find more information To learn more about diabetes and diabetes prevention, visit:  American Diabetes Association (ADA): www.diabetes.[employer]  National Institute of Diabetes and Digestive and Kidney Diseases: ToyArticles.cawww.niddk.nih.gov/health-information/diabetes To learn more about healthy eating, visit:  The U.S. Department of Agriculture Architect(USDA), Choose My Plate: http://yates.biz/www.choosemyplate.gov/food-groups  Office of Disease Prevention and Health Promotion (ODPHP), Dietary Guidelines: ListingMagazine.siwww.health.gov/dietaryguidelines Summary  You can reduce your risk for type 2 diabetes by increasing your physical activity, eating healthy foods, and losing weight as directed.  Talk with your health care provider about your risk for type 2 diabetes. Ask about any blood tests or screening tests that you need to have. This information is not intended to replace advice given to you by your  health care provider. Make sure you discuss any questions you have with your health care provider. Document Released: 12/18/2015 Document Revised: 08/07/2017 Document Reviewed: 10/17/2015 Elsevier Interactive Patient Education  2019 Elsevier Inc. Prediabetes Eating Plan Prediabetes is a condition that causes blood sugar (glucose) levels to be higher than normal. This increases the risk for developing diabetes. In order to prevent diabetes from developing, your health care provider may recommend a diet and other lifestyle changes to help you:  Control your blood glucose levels.  Improve your cholesterol levels.  Manage your blood pressure. Your health care provider may recommend working with a diet and nutrition specialist (dietitian) to make a meal plan that is best for you. What are tips for following this plan? Lifestyle  Set weight loss goals with the help of your health care team. It is recommended that most people with prediabetes lose 7% of their current body weight.  Exercise for at least 30 minutes at least 5 days a week.  Attend a support group or seek ongoing support from a mental health counselor.  Take over-the-counter and prescription medicines only as told by your  health care provider. Reading food labels  Read food labels to check the amount of fat, salt (sodium), and sugar in prepackaged foods. Avoid foods that have: ? Saturated fats. ? Trans fats. ? Added sugars.  Avoid foods that have more than 300 milligrams (mg) of sodium per serving. Limit your daily sodium intake to less than 2,300 mg each day. Shopping  Avoid buying pre-made and processed foods. Cooking  Cook with olive oil. Do not use butter, lard, or ghee.  Bake, broil, grill, or boil foods. Avoid frying. Meal planning   Work with your dietitian to develop an eating plan that is right for you. This may include: ? Tracking how many calories you take in. Use a food diary, notebook, or mobile  application to track what you eat at each meal. ? Using the glycemic index (GI) to plan your meals. The index tells you how quickly a food will raise your blood glucose. Choose low-GI foods. These foods take a longer time to raise blood glucose.  Consider following a Mediterranean diet. This diet includes: ? Several servings each day of fresh fruits and vegetables. ? Eating fish at least twice a week. ? Several servings each day of whole grains, beans, nuts, and seeds. ? Using olive oil instead of other fats. ? Moderate alcohol consumption. ? Eating small amounts of red meat and whole-fat dairy.  If you have high blood pressure, you may need to limit your sodium intake or follow a diet such as the DASH eating plan. DASH is an eating plan that aims to lower high blood pressure. What foods are recommended? The items listed below may not be a complete list. Talk with your dietitian about what dietary choices are best for you. Grains Whole grains, such as whole-wheat or whole-grain breads, crackers, cereals, and pasta. Unsweetened oatmeal. Bulgur. Barley. Quinoa. Brown rice. Corn or whole-wheat flour tortillas or taco shells. Vegetables Lettuce. Spinach. Peas. Beets. Cauliflower. Cabbage. Broccoli. Carrots. Tomatoes. Squash. Eggplant. Herbs. Peppers. Onions. Cucumbers. Brussels sprouts. Fruits Berries. Bananas. Apples. Oranges. Grapes. Papaya. Mango. Pomegranate. Kiwi. Grapefruit. Cherries. Meats and other protein foods Seafood. Poultry without skin. Lean cuts of pork and beef. Tofu. Eggs. Nuts. Beans. Dairy Low-fat or fat-free dairy products, such as yogurt, cottage cheese, and cheese. Beverages Water. Tea. Coffee. Sugar-free or diet soda. Seltzer water. Lowfat or no-fat milk. Milk alternatives, such as soy or almond milk. Fats and oils Olive oil. Canola oil. Sunflower oil. Grapeseed oil. Avocado. Walnuts. Sweets and desserts Sugar-free or low-fat pudding. Sugar-free or low-fat ice cream  and other frozen treats. Seasoning and other foods Herbs. Sodium-free spices. Mustard. Relish. Low-fat, low-sugar ketchup. Low-fat, low-sugar barbecue sauce. Low-fat or fat-free mayonnaise. What foods are not recommended? The items listed below may not be a complete list. Talk with your dietitian about what dietary choices are best for you. Grains Refined white flour and flour products, such as bread, pasta, snack foods, and cereals. Vegetables Canned vegetables. Frozen vegetables with butter or cream sauce. Fruits Fruits canned with syrup. Meats and other protein foods Fatty cuts of meat. Poultry with skin. Breaded or fried meat. Processed meats. Dairy Full-fat yogurt, cheese, or milk. Beverages Sweetened drinks, such as sweet iced tea and soda. Fats and oils Butter. Lard. Ghee. Sweets and desserts Baked goods, such as cake, cupcakes, pastries, cookies, and cheesecake. Seasoning and other foods Spice mixes with added salt. Ketchup. Barbecue sauce. Mayonnaise. Summary  To prevent diabetes from developing, you may need to make diet and other lifestyle changes  to help control blood sugar, improve cholesterol levels, and manage your blood pressure.  Set weight loss goals with the help of your health care team. It is recommended that most people with prediabetes lose 7 percent of their current body weight.  Consider following a Mediterranean diet that includes plenty of fresh fruits and vegetables, whole grains, beans, nuts, seeds, fish, lean meat, low-fat dairy, and healthy oils. This information is not intended to replace advice given to you by your health care provider. Make sure you discuss any questions you have with your health care provider. Document Released: 01/10/2015 Document Revised: 10/30/2016 Document Reviewed: 10/30/2016 Elsevier Interactive Patient Education  2019 Elsevier Inc.  Cough, Adult  A cough helps to clear your throat and lungs. A cough may last only 2-3  weeks (acute), or it may last longer than 8 weeks (chronic). Many different things can cause a cough. A cough may be a sign of an illness or another medical condition. Follow these instructions at home:  Pay attention to any changes in your cough.  Take medicines only as told by your doctor. ? If you were prescribed an antibiotic medicine, take it as told by your doctor. Do not stop taking it even if you start to feel better. ? Talk with your doctor before you try using a cough medicine.  Drink enough fluid to keep your pee (urine) clear or pale yellow.  If the air is dry, use a cold steam vaporizer or humidifier in your home.  Stay away from things that make you cough at work or at home.  If your cough is worse at night, try using extra pillows to raise your head up higher while you sleep.  Do not smoke, and try not to be around smoke. If you need help quitting, ask your doctor.  Do not have caffeine.  Do not drink alcohol.  Rest as needed. Contact a doctor if:  You have new problems (symptoms).  You cough up yellow fluid (pus).  Your cough does not get better after 2-3 weeks, or your cough gets worse.  Medicine does not help your cough and you are not sleeping well.  You have pain that gets worse or pain that is not helped with medicine.  You have a fever.  You are losing weight and you do not know why.  You have night sweats. Get help right away if:  You cough up blood.  You have trouble breathing.  Your heartbeat is very fast. This information is not intended to replace advice given to you by your health care provider. Make sure you discuss any questions you have with your health care provider. Document Released: 05/09/2011 Document Revised: 02/01/2016 Document Reviewed: 11/02/2014 Elsevier Interactive Patient Education  2019 [employer].

## 2019-01-14 ENCOUNTER — Ambulatory Visit: Payer: Self-pay | Admitting: Family Medicine

## 2019-01-14 LAB — VITAMIN D 25 HYDROXY (VIT D DEFICIENCY, FRACTURES): Vit D, 25-Hydroxy: 5.6 ng/mL — ABNORMAL LOW (ref 30.0–100.0)

## 2019-01-14 LAB — CBC WITH DIFFERENTIAL/PLATELET
Basophils Absolute: 0 10*3/uL (ref 0.0–0.2)
Basos: 1 %
EOS (ABSOLUTE): 0 10*3/uL (ref 0.0–0.4)
Eos: 0 %
Hematocrit: 41.2 % (ref 34.0–46.6)
Hemoglobin: 13 g/dL (ref 11.1–15.9)
Immature Grans (Abs): 0 10*3/uL (ref 0.0–0.1)
Immature Granulocytes: 0 %
Lymphocytes Absolute: 1.4 10*3/uL (ref 0.7–3.1)
Lymphs: 27 %
MCH: 26.5 pg — ABNORMAL LOW (ref 26.6–33.0)
MCHC: 31.6 g/dL (ref 31.5–35.7)
MCV: 84 fL (ref 79–97)
Monocytes Absolute: 0.3 10*3/uL (ref 0.1–0.9)
Monocytes: 6 %
Neutrophils Absolute: 3.3 10*3/uL (ref 1.4–7.0)
Neutrophils: 66 %
Platelets: 447 10*3/uL (ref 150–450)
RBC: 4.91 x10E6/uL (ref 3.77–5.28)
RDW: 12.8 % (ref 11.7–15.4)
WBC: 5 10*3/uL (ref 3.4–10.8)

## 2019-01-14 LAB — COMPREHENSIVE METABOLIC PANEL
ALT: 40 IU/L — ABNORMAL HIGH (ref 0–32)
AST: 15 IU/L (ref 0–40)
Albumin/Globulin Ratio: 1.3 (ref 1.2–2.2)
Albumin: 4.4 g/dL (ref 3.8–4.8)
Alkaline Phosphatase: 101 IU/L (ref 39–117)
BUN/Creatinine Ratio: 20 (ref 9–23)
BUN: 14 mg/dL (ref 6–24)
Bilirubin Total: 0.4 mg/dL (ref 0.0–1.2)
CO2: 21 mmol/L (ref 20–29)
Calcium: 10.3 mg/dL — ABNORMAL HIGH (ref 8.7–10.2)
Chloride: 96 mmol/L (ref 96–106)
Creatinine, Ser: 0.69 mg/dL (ref 0.57–1.00)
GFR calc Af Amer: 125 mL/min/{1.73_m2} (ref >59)
GFR calc non Af Amer: 108 mL/min/{1.73_m2} (ref >59)
Globulin, Total: 3.5 g/dL (ref 1.5–4.5)
Glucose: 299 mg/dL — ABNORMAL HIGH (ref 65–99)
Potassium: 4.1 mmol/L (ref 3.5–5.2)
Sodium: 135 mmol/L (ref 134–144)
Total Protein: 7.9 g/dL (ref 6.0–8.5)

## 2019-01-14 LAB — LIPID PANEL
Chol/HDL Ratio: 4.3 ratio (ref 0.0–4.4)
Cholesterol, Total: 195 mg/dL (ref 100–199)
HDL: 45 mg/dL (ref >39)
LDL Calculated: 130 mg/dL — ABNORMAL HIGH (ref 0–99)
Triglycerides: 102 mg/dL (ref 0–149)
VLDL Cholesterol Cal: 20 mg/dL (ref 5–40)

## 2019-01-14 LAB — TSH: TSH: 0.359 u[IU]/mL — ABNORMAL LOW (ref 0.450–4.500)

## 2019-01-14 LAB — VITAMIN B12: Vitamin B-12: 702 pg/mL (ref 232–1245)

## 2019-01-15 ENCOUNTER — Telehealth: Payer: Self-pay

## 2019-01-15 ENCOUNTER — Encounter: Payer: Self-pay | Admitting: Family Medicine

## 2019-01-15 NOTE — Telephone Encounter (Signed)
Patient notified that paperwork is ready for pick up 

## 2019-01-18 NOTE — Telephone Encounter (Signed)
-----   Message from Kallie Locks, FNP sent at 01/13/2019  8:43 PM EDT ----- Regarding: "Labs" Valerie Moss,   Patient did not have her labs drawn today. Possibly insurance reasons? Please let her know that we really need to assess her current lab values, since Hospital Discharge. Thank you!

## 2019-01-18 NOTE — Telephone Encounter (Signed)
It looks like patient did get labs drawn.

## 2019-01-21 ENCOUNTER — Encounter: Payer: Self-pay | Admitting: Family Medicine

## 2019-01-21 ENCOUNTER — Other Ambulatory Visit: Payer: Self-pay | Admitting: Family Medicine

## 2019-01-21 DIAGNOSIS — R7989 Other specified abnormal findings of blood chemistry: Secondary | ICD-10-CM

## 2019-01-21 DIAGNOSIS — E559 Vitamin D deficiency, unspecified: Secondary | ICD-10-CM

## 2019-01-21 MED ORDER — VITAMIN D (ERGOCALCIFEROL) 1.25 MG (50000 UNIT) PO CAPS: 50000.0000 [IU] | ORAL_CAPSULE | ORAL | 3 refills | Status: DC

## 2019-02-15 ENCOUNTER — Other Ambulatory Visit: Payer: Self-pay

## 2019-02-15 ENCOUNTER — Encounter: Payer: Self-pay | Admitting: Family Medicine

## 2019-02-15 ENCOUNTER — Ambulatory Visit: Payer: Self-pay

## 2019-02-15 DIAGNOSIS — R7989 Other specified abnormal findings of blood chemistry: Secondary | ICD-10-CM

## 2019-02-16 LAB — THYROID PANEL WITH TSH
Free Thyroxine Index: 2.1 (ref 1.2–4.9)
T3 Uptake Ratio: 25 % (ref 24–39)
T4, Total: 8.4 ug/dL (ref 4.5–12.0)
TSH: 0.648 u[IU]/mL (ref 0.450–4.500)

## 2019-05-18 ENCOUNTER — Ambulatory Visit: Payer: Self-pay | Admitting: Family Medicine

## 2019-05-31 ENCOUNTER — Ambulatory Visit: Payer: Self-pay | Admitting: Family Medicine

## 2019-08-17 ENCOUNTER — Emergency Department (HOSPITAL_COMMUNITY): Payer: Medicaid Other

## 2019-08-17 ENCOUNTER — Inpatient Hospital Stay (HOSPITAL_COMMUNITY)
Admission: EM | Admit: 2019-08-17 | Discharge: 2019-08-22 | DRG: 871 | Disposition: A | Discharge status: Home / Self Care | Payer: Medicaid Other | Attending: Internal Medicine | Admitting: Internal Medicine

## 2019-08-17 ENCOUNTER — Other Ambulatory Visit: Payer: Self-pay

## 2019-08-17 DIAGNOSIS — J1289 Other viral pneumonia: Secondary | ICD-10-CM | POA: Diagnosis present

## 2019-08-17 DIAGNOSIS — A4189 Other specified sepsis: Secondary | ICD-10-CM | POA: Diagnosis present

## 2019-08-17 DIAGNOSIS — E559 Vitamin D deficiency, unspecified: Secondary | ICD-10-CM | POA: Diagnosis present

## 2019-08-17 DIAGNOSIS — U071 COVID-19: Secondary | ICD-10-CM | POA: Diagnosis present

## 2019-08-17 DIAGNOSIS — J189 Pneumonia, unspecified organism: Secondary | ICD-10-CM

## 2019-08-17 DIAGNOSIS — E119 Type 2 diabetes mellitus without complications: Secondary | ICD-10-CM

## 2019-08-17 DIAGNOSIS — J1282 Pneumonia due to coronavirus disease 2019: Secondary | ICD-10-CM | POA: Diagnosis present

## 2019-08-17 DIAGNOSIS — Z6841 Body Mass Index (BMI) 40.0 and over, adult: Secondary | ICD-10-CM

## 2019-08-17 DIAGNOSIS — J9601 Acute respiratory failure with hypoxia: Secondary | ICD-10-CM | POA: Diagnosis present

## 2019-08-17 DIAGNOSIS — Z8249 Family history of ischemic heart disease and other diseases of the circulatory system: Secondary | ICD-10-CM

## 2019-08-17 DIAGNOSIS — B342 Coronavirus infection, unspecified: Secondary | ICD-10-CM

## 2019-08-17 DIAGNOSIS — E1165 Type 2 diabetes mellitus with hyperglycemia: Secondary | ICD-10-CM | POA: Diagnosis present

## 2019-08-17 DIAGNOSIS — A419 Sepsis, unspecified organism: Secondary | ICD-10-CM

## 2019-08-17 DIAGNOSIS — R197 Diarrhea, unspecified: Secondary | ICD-10-CM | POA: Diagnosis present

## 2019-08-17 DIAGNOSIS — I10 Essential (primary) hypertension: Secondary | ICD-10-CM | POA: Diagnosis present

## 2019-08-17 DIAGNOSIS — R0902 Hypoxemia: Secondary | ICD-10-CM | POA: Diagnosis present

## 2019-08-17 HISTORY — DX: Coronavirus infection, unspecified: B34.2

## 2019-08-17 HISTORY — DX: Dermatitis, unspecified: L30.9

## 2019-08-17 LAB — COMPREHENSIVE METABOLIC PANEL
ALT: 62 U/L — ABNORMAL HIGH (ref 0–44)
AST: 93 U/L — ABNORMAL HIGH (ref 15–41)
Albumin: 3.4 g/dL — ABNORMAL LOW (ref 3.5–5.0)
Alkaline Phosphatase: 66 U/L (ref 38–126)
Anion gap: 11 (ref 5–15)
BUN: 7 mg/dL (ref 6–20)
CO2: 29 mmol/L (ref 22–32)
Calcium: 8.6 mg/dL — ABNORMAL LOW (ref 8.9–10.3)
Chloride: 96 mmol/L — ABNORMAL LOW (ref 98–111)
Creatinine, Ser: 0.81 mg/dL (ref 0.44–1.00)
GFR calc Af Amer: >60 mL/min (ref >60)
GFR calc non Af Amer: >60 mL/min (ref >60)
Glucose, Bld: 223 mg/dL — ABNORMAL HIGH (ref 70–99)
Potassium: 3.3 mmol/L — ABNORMAL LOW (ref 3.5–5.1)
Sodium: 136 mmol/L (ref 135–145)
Total Bilirubin: 1.7 mg/dL — ABNORMAL HIGH (ref 0.3–1.2)
Total Protein: 7.8 g/dL (ref 6.5–8.1)

## 2019-08-17 LAB — C-REACTIVE PROTEIN: CRP: 4.5 mg/dL — ABNORMAL HIGH (ref <1.0)

## 2019-08-17 LAB — CBC WITH DIFFERENTIAL/PLATELET
Abs Immature Granulocytes: 0.03 10*3/uL (ref 0.00–0.07)
Basophils Absolute: 0 10*3/uL (ref 0.0–0.1)
Basophils Relative: 0 %
Eosinophils Absolute: 0 10*3/uL (ref 0.0–0.5)
Eosinophils Relative: 0 %
HCT: 40.4 % (ref 36.0–46.0)
Hemoglobin: 13.1 g/dL (ref 12.0–15.0)
Immature Granulocytes: 1 %
Lymphocytes Relative: 21 %
Lymphs Abs: 1.1 10*3/uL (ref 0.7–4.0)
MCH: 27.2 pg (ref 26.0–34.0)
MCHC: 32.4 g/dL (ref 30.0–36.0)
MCV: 84 fL (ref 80.0–100.0)
Monocytes Absolute: 0.4 10*3/uL (ref 0.1–1.0)
Monocytes Relative: 8 %
Neutro Abs: 3.6 10*3/uL (ref 1.7–7.7)
Neutrophils Relative %: 70 %
Platelets: 292 10*3/uL (ref 150–400)
RBC: 4.81 MIL/uL (ref 3.87–5.11)
RDW: 11.9 % (ref 11.5–15.5)
WBC: 5 10*3/uL (ref 4.0–10.5)
nRBC: 0 % (ref 0.0–0.2)

## 2019-08-17 LAB — POC SARS CORONAVIRUS 2 AG -  ED: SARS Coronavirus 2 Ag: NEGATIVE

## 2019-08-17 LAB — HEMOGLOBIN A1C
Hgb A1c MFr Bld: 8.2 % — ABNORMAL HIGH (ref 4.8–5.6)
Mean Plasma Glucose: 188.64 mg/dL

## 2019-08-17 LAB — FIBRINOGEN: Fibrinogen: 471 mg/dL (ref 210–475)

## 2019-08-17 LAB — LACTIC ACID, PLASMA: Lactic Acid, Venous: 1.4 mmol/L (ref 0.5–1.9)

## 2019-08-17 LAB — RESPIRATORY PANEL BY RT PCR (FLU A&B, COVID)
Influenza A by PCR: NEGATIVE
Influenza B by PCR: NEGATIVE
SARS Coronavirus 2 by RT PCR: POSITIVE — AB

## 2019-08-17 LAB — I-STAT BETA HCG BLOOD, ED (MC, WL, AP ONLY): I-stat hCG, quantitative: <5 m[IU]/mL (ref <5)

## 2019-08-17 LAB — GLUCOSE, CAPILLARY: Glucose-Capillary: 314 mg/dL — ABNORMAL HIGH (ref 70–99)

## 2019-08-17 LAB — LACTATE DEHYDROGENASE: LDH: 449 U/L — ABNORMAL HIGH (ref 98–192)

## 2019-08-17 LAB — FERRITIN: Ferritin: 2670 ng/mL — ABNORMAL HIGH (ref 11–307)

## 2019-08-17 LAB — PROCALCITONIN: Procalcitonin: <0.1 ng/mL

## 2019-08-17 LAB — TRIGLYCERIDES: Triglycerides: 108 mg/dL (ref <150)

## 2019-08-17 LAB — D-DIMER, QUANTITATIVE: D-Dimer, Quant: 1.82 ug/mL-FEU — ABNORMAL HIGH (ref 0.00–0.50)

## 2019-08-17 MED ORDER — ZINC SULFATE 220 (50 ZN) MG PO CAPS
220.0000 mg | ORAL_CAPSULE | Freq: Every day | ORAL | Status: DC
  Administered 2019-08-17 – 2019-08-22 (×6): 220 mg via ORAL
  Filled 2019-08-17 (×6): qty 1

## 2019-08-17 MED ORDER — HYDROCHLOROTHIAZIDE 25 MG PO TABS
25.0000 mg | ORAL_TABLET | Freq: Every day | ORAL | Status: DC
  Administered 2019-08-18 – 2019-08-22 (×5): 25 mg via ORAL
  Filled 2019-08-17 (×5): qty 1

## 2019-08-17 MED ORDER — ENOXAPARIN SODIUM 40 MG/0.4ML ~~LOC~~ SOLN
40.0000 mg | SUBCUTANEOUS | Status: DC
  Administered 2019-08-17: 40 mg via SUBCUTANEOUS
  Filled 2019-08-17: qty 0.4

## 2019-08-17 MED ORDER — ACETAMINOPHEN 500 MG PO TABS
1000.0000 mg | ORAL_TABLET | Freq: Once | ORAL | Status: AC
  Administered 2019-08-17: 1000 mg via ORAL
  Filled 2019-08-17: qty 2

## 2019-08-17 MED ORDER — DEXAMETHASONE SODIUM PHOSPHATE 10 MG/ML IJ SOLN
10.0000 mg | Freq: Once | INTRAMUSCULAR | Status: AC
  Administered 2019-08-17: 10 mg via INTRAVENOUS
  Filled 2019-08-17: qty 1

## 2019-08-17 MED ORDER — POTASSIUM CHLORIDE CRYS ER 20 MEQ PO TBCR
40.0000 meq | EXTENDED_RELEASE_TABLET | Freq: Once | ORAL | Status: AC
  Administered 2019-08-17: 40 meq via ORAL
  Filled 2019-08-17: qty 2

## 2019-08-17 MED ORDER — SODIUM CHLORIDE 0.9 % IV SOLN
200.0000 mg | Freq: Once | INTRAVENOUS | Status: AC
  Administered 2019-08-17: 200 mg via INTRAVENOUS
  Filled 2019-08-17: qty 40

## 2019-08-17 MED ORDER — AMLODIPINE BESYLATE 10 MG PO TABS
10.0000 mg | ORAL_TABLET | Freq: Every day | ORAL | Status: DC
  Administered 2019-08-18 – 2019-08-22 (×5): 10 mg via ORAL
  Filled 2019-08-17: qty 1
  Filled 2019-08-17: qty 2
  Filled 2019-08-17: qty 1
  Filled 2019-08-17 (×2): qty 2
  Filled 2019-08-17 (×3): qty 1
  Filled 2019-08-17: qty 2

## 2019-08-17 MED ORDER — ONDANSETRON HCL 4 MG/2ML IJ SOLN
4.0000 mg | Freq: Once | INTRAMUSCULAR | Status: AC
  Administered 2019-08-17: 4 mg via INTRAVENOUS
  Filled 2019-08-17: qty 2

## 2019-08-17 MED ORDER — VITAMIN C 500 MG PO TABS
500.0000 mg | ORAL_TABLET | Freq: Two times a day (BID) | ORAL | Status: DC
  Administered 2019-08-17 – 2019-08-22 (×11): 500 mg via ORAL
  Filled 2019-08-17 (×11): qty 1

## 2019-08-17 MED ORDER — METHYLPREDNISOLONE SODIUM SUCC 40 MG IJ SOLR
40.0000 mg | Freq: Two times a day (BID) | INTRAMUSCULAR | Status: DC
  Administered 2019-08-17 – 2019-08-20 (×6): 40 mg via INTRAVENOUS
  Filled 2019-08-17 (×7): qty 1

## 2019-08-17 MED ORDER — INSULIN ASPART 100 UNIT/ML ~~LOC~~ SOLN
0.0000 [IU] | Freq: Three times a day (TID) | SUBCUTANEOUS | Status: DC
  Administered 2019-08-18: 11 [IU] via SUBCUTANEOUS
  Administered 2019-08-18: 15 [IU] via SUBCUTANEOUS
  Administered 2019-08-18: 8 [IU] via SUBCUTANEOUS
  Administered 2019-08-19: 11 [IU] via SUBCUTANEOUS
  Administered 2019-08-19 (×2): 15 [IU] via SUBCUTANEOUS
  Administered 2019-08-20: 11 [IU] via SUBCUTANEOUS
  Filled 2019-08-17: qty 0.15

## 2019-08-17 MED ORDER — SODIUM CHLORIDE 0.9 % IV SOLN
100.0000 mg | Freq: Every day | INTRAVENOUS | Status: AC
  Administered 2019-08-18 – 2019-08-21 (×4): 100 mg via INTRAVENOUS
  Filled 2019-08-17 (×4): qty 20

## 2019-08-17 MED ORDER — BENZONATATE 100 MG PO CAPS
100.0000 mg | ORAL_CAPSULE | Freq: Three times a day (TID) | ORAL | Status: DC | PRN
  Administered 2019-08-17 – 2019-08-19 (×3): 100 mg via ORAL
  Filled 2019-08-17 (×3): qty 1

## 2019-08-17 NOTE — ED Notes (Signed)
Pt medication dose admin time moved, as we are waiting for pharmacy verification

## 2019-08-17 NOTE — ED Notes (Signed)
Report called for pt transfer to [facility]. Spoke with pts receiving nurse Nevin Bloodgood

## 2019-08-17 NOTE — H&P (Signed)
History and Physical    Valerie Moss XBD:[id number] DOB: 10/28/1975 DOA: 08/17/2019  PCP: Azzie Glatter, FNP   Patient coming from: Home    Chief Complaint: Generalized weakness, fever, shortness of breath, cough  HPI: Valerie Moss is a 43 y.o. female with medical history significant of hypertension, morbid obesity who presents to the emergency department today with complaints of fever, shortness of breath, cough, generalized weakness, loss of appetite and taste.  Patient works at [employer].  She did not feel well since last Thursday.  On Sunday she tested positive for Covid-19 at [facility] testing center.  Since last 2 days she has been feeling very unwell.  She had high-grade fever.  Developed shortness of breath, cough.  She lost her appetite and sense of smell.  She is also having diarrhea and abdominal discomfort.  Her chest feels heavy.  She has a daughter at home and she says she is fine.  She was very weak and unable to even stand so she presented to the emergency department. When EMS arrived at her home she was saturating 70% on room air.  They put her on 6 L of oxygen per minute. Patient seen and examined at the bedside this evening.  She was on 3.5 L of oxygen per minute and she was saturating fine on that.  She was not in any acute respiratory distress.  She complains of feeling very weak.  ED Course: Chest x-ray done in the emergency department showed  Patchy airspace infiltrates in the lower lungs consistent with COVID Pneumonia.  Also found to be mildly  hypertensive.  Found to have elevated LDH, ferritin, CRP.  Procalcitonin is negative.  Found to be have  hyperglycemic.  She does not have any history of diabetes.  Review of Systems: As per HPI otherwise 10 point review of systems negative.    Past Medical History:  Diagnosis Date  . Anemia   . Arrhythmia   . Cough   . Elevated liver enzymes   . Hypertension   . Hypocalcemia   . Hypokalemia   . Obese    . Pneumonia   . Vitamin D deficiency     No past surgical history on file.   reports that she has never smoked. She has never used smokeless tobacco. She reports that she does not drink alcohol or use drugs.  No Known Allergies  Family History  Problem Relation Age of Onset  . Hypertension Mother      Prior to Admission medications   Medication Sig Start Date End Date Taking? Authorizing Provider  acetaminophen (TYLENOL) 500 MG tablet Take 1,000 mg by mouth every 6 (six) hours as needed for mild pain.    [provider]  albuterol (VENTOLIN HFA) 108 (90 Base) MCG/ACT inhaler 2 puffs. 12/25/18   [provider]  amLODipine (NORVASC) 10 MG tablet Take 1 tablet (10 mg total) by mouth daily. 01/01/19   Nita Sells, MD  benzonatate (TESSALON) 100 MG capsule Take 100 mg by mouth 3 (three) times daily as needed for cough.    [provider]  blood glucose meter kit and supplies Dispense based on patient and insurance preference. Use up to four times daily as directed. (FOR ICD-10 E10.9, E11.9). 01/13/19   Azzie Glatter, FNP  hydrochlorothiazide (HYDRODIURIL) 25 MG tablet Take 1 tablet (25 mg total) by mouth daily. 12/31/18   Nita Sells, MD  Vitamin D, Ergocalciferol, (DRISDOL) 1.25 MG (50000 UT) CAPS capsule Take 1  capsule (50,000 Units total) by mouth every 7 (seven) days. 01/21/19   Azzie Glatter, FNP    Physical Exam: Vitals:   08/17/19 1630 08/17/19 1730 08/17/19 1800 08/17/19 1813  BP: (!) 135/97 (!) 145/87 (!) 147/88 (!) 147/88  Pulse: 99 96 91 92  Resp: (!) 33 (!) 24 (!) 30 (!) 43  Temp:    100.1 F (37.8 C)  TempSrc:    Oral  SpO2: 92% 91% 92% 92%  Weight:      Height:        Constitutional: Morbidly obese Vitals:   08/17/19 1630 08/17/19 1730 08/17/19 1800 08/17/19 1813  BP: (!) 135/97 (!) 145/87 (!) 147/88 (!) 147/88  Pulse: 99 96 91 92  Resp: (!) 33 (!) 24 (!) 30 (!) 43  Temp:    100.1 F (37.8 C)  TempSrc:    Oral   SpO2: 92% 91% 92% 92%  Weight:      Height:       Eyes: PERRL, lids and conjunctivae normal ENMT: Mucous membranes are moist Neck: normal, supple, no masses, no thyromegaly Respiratory: Diminished air entry on the bases  normal respiratory effort. No accessory muscle use.  Cardiovascular: Regular rate and rhythm, no murmurs / rubs / gallops. No extremity edema.  Abdomen: no tenderness, no masses palpated Musculoskeletal: no clubbing / cyanosis. No joint deformity upper and lower extremities.   Skin: no rashes, lesions, ulcers. No induration Neurologic: CN 2-12 grossly intact.   Strength 5/5 in all 4.  Psychiatric: Normal judgment and insight. Alert and oriented x 3.   Foley Catheter:None  Labs on Admission: I have personally reviewed following labs and imaging studies  CBC: Recent Labs  Lab 08/17/19 1610  WBC 5.0  NEUTROABS 3.6  HGB 13.1  HCT 40.4  MCV 84.0  PLT 654   Basic Metabolic Panel: Recent Labs  Lab 08/17/19 1610  NA 136  K 3.3*  CL 96*  CO2 29  GLUCOSE 223*  BUN 7  CREATININE 0.81  CALCIUM 8.6*   GFR: Estimated Creatinine Clearance: 126.8 mL/min (by C-G formula based on SCr of 0.81 mg/dL). Liver Function Tests: Recent Labs  Lab 08/17/19 1610  AST 93*  ALT 62*  ALKPHOS 66  BILITOT 1.7*  PROT 7.8  ALBUMIN 3.4*   No results for input(s): LIPASE, AMYLASE in the last 168 hours. No results for input(s): AMMONIA in the last 168 hours. Coagulation Profile: No results for input(s): INR, PROTIME in the last 168 hours. Cardiac Enzymes: No results for input(s): CKTOTAL, CKMB, CKMBINDEX, TROPONINI in the last 168 hours. BNP (last 3 results) No results for input(s): PROBNP in the last 8760 hours. HbA1C: No results for input(s): HGBA1C in the last 72 hours. CBG: No results for input(s): GLUCAP in the last 168 hours. Lipid Profile: Recent Labs    08/17/19 1552  TRIG 108   Thyroid Function Tests: No results for input(s): TSH, T4TOTAL, FREET4,  T3FREE, THYROIDAB in the last 72 hours. Anemia Panel: Recent Labs    08/17/19 1610  FERRITIN 2,670*   Urine analysis:    Component Value Date/Time   BILIRUBINUR negative 01/13/2019 0827   KETONESUR trace (5) (A) 01/13/2019 0827   PROTEINUR negative 01/13/2019 0827   UROBILINOGEN 0.2 01/13/2019 0827   NITRITE Negative 01/13/2019 0827   LEUKOCYTESUR Negative 01/13/2019 0827    Radiological Exams on Admission: Dg Chest Port 1 View  Result Date: 08/17/2019 CLINICAL DATA:  Fever 101.9 degrees, diarrhea, tested positive for COVID-19 on 08/15/2019  EXAM: PORTABLE CHEST 1 VIEW COMPARISON:  Portable exam 1629 hours compared to 12/31/2018 FINDINGS: Borderline enlargement of cardiac silhouette. Mediastinal contours normal. Patchy airspace infiltrates mid to lower LEFT lung and at RIGHT base consistent with multifocal pneumonia and history of COVID-19. No pleural effusion or pneumothorax. Bones unremarkable. IMPRESSION: Patchy airspace infiltrates in the lower lungs consistent with COVID pneumonia. Electronically Signed   By: Lavonia Dana M.D.   On: 08/17/2019 16:52     Assessment/Plan Active Problems:   COVID-19   Covid-19 pneumonia: Presented with classic Covid symptoms. Found to be hypoxic on arrival.  Currently saturating fine on 3 to 4 L of oxygen.  Chest x-ray showed bilateral infiltrates. Started on remdesivir, Solu-Medrol.  Not a candidate for Actemra. Continue to monitor LFTs while on remdesivir.  She has mild elevated LFTs. Continue daily inflammatory markers. Patient [facility] be [facility] the [facility] as soon as bed is available.  Hypertension: Continue her home medicines.  Continue to monitor blood pressure  Hyperglycemia: No history of diabetes.  Will check hemoglobin A1c.  Since she is on steroids, we will start her on sliding-scale insulin  Morbid obesity: BMI of 44.7   Severity of Illness: The appropriate patient status for this patient is INPATIENT.     DVT prophylaxis: Lovenox Code Status: Full Family Communication: None present at the bedside Consults called: None     Shelly Coss MD [facility] Pager [phone]  If 7PM-7AM, please contact night-coverage [email or web address] Password [facility]  08/17/2019, 6:24 PM

## 2019-08-17 NOTE — ED Triage Notes (Signed)
43 yo female BIB by [facility] came in for fever pt states she had not been taking her temperature she just "felt hot". EMS states pts temp 101.9 at the scene. Pt is COVID +, she tested positive of Sunday 08/15/19. PT denies SOB, denies nausea or vomiting, but states she has been having diarrhea since Sunday. Pt states that she feels light headed up standing. EMS states pulse ox upon arrival was 70 %, placed pt on nasal cannula 6 L and it went up to 92 %. Pts cbg 256, pt denies history of diabetes.   Vitals: bp 140/96 Hr 108 spo2 92% rr20

## 2019-08-17 NOTE — ED Notes (Signed)
Pain assessment charted as well as response to medication/intervention

## 2019-08-17 NOTE — ED Provider Notes (Signed)
[facility] DEPT Provider Note   CSN: [id number] Arrival date & time: 08/17/19  1456     History   Chief Complaint Chief Complaint  Patient presents with  . COVID +  . Fever    HPI Oona Trammel is a 43 y.o. female.     The history is provided by the patient.  Fever Temp source:  Subjective Severity:  Mild Onset quality:  Gradual Duration:  1 week Timing:  Constant Progression:  Unchanged Chronicity:  New Relieved by:  Nothing Worsened by:  Nothing Associated symptoms: chills and cough   Associated symptoms: no chest pain, no dysuria, no ear pain, no rash, no sore throat and no vomiting   Risk factors: recent sickness (positive for COVID outpatient setting)     Past Medical History:  Diagnosis Date  . Anemia   . Arrhythmia   . Cough   . Elevated liver enzymes   . Hypertension   . Hypocalcemia   . Hypokalemia   . Obese   . Pneumonia   . Vitamin D deficiency     Patient Active Problem List   Diagnosis Date Noted  . Cough 01/13/2019  . Class 3 severe obesity due to excess calories with serious comorbidity and body mass index (BMI) of 45.0 to 49.9 in adult ([place]) 01/13/2019  . Elevated glucose 01/13/2019  . Elevated liver enzymes 01/13/2019  . Anemia 01/13/2019  . Hypocalcemia 01/13/2019  . Pneumonia 12/26/2018  . Tachypnea 12/26/2018  . Leucocytosis 12/26/2018  . Hypokalemia 12/26/2018  . Hyponatremia 12/26/2018    No past surgical history on file.   OB History   No obstetric history on file.      Home Medications    Prior to Admission medications   Medication Sig Start Date End Date Taking? Authorizing Provider  acetaminophen (TYLENOL) 500 MG tablet Take 1,000 mg by mouth every 6 (six) hours as needed for mild pain.    [provider]  albuterol (VENTOLIN HFA) 108 (90 Base) MCG/ACT inhaler 2 puffs. 12/25/18   [provider]  amLODipine (NORVASC) 10 MG tablet Take 1 tablet (10 mg total) by  mouth daily. 01/01/19   Nita Sells, MD  benzonatate (TESSALON) 100 MG capsule Take 100 mg by mouth 3 (three) times daily as needed for cough.    [provider]  blood glucose meter kit and supplies Dispense based on patient and insurance preference. Use up to four times daily as directed. (FOR ICD-10 E10.9, E11.9). 01/13/19   Azzie Glatter, FNP  hydrochlorothiazide (HYDRODIURIL) 25 MG tablet Take 1 tablet (25 mg total) by mouth daily. 12/31/18   Nita Sells, MD  Vitamin D, Ergocalciferol, (DRISDOL) 1.25 MG (50000 UT) CAPS capsule Take 1 capsule (50,000 Units total) by mouth every 7 (seven) days. 01/21/19   Azzie Glatter, FNP    Family History Family History  Problem Relation Age of Onset  . Hypertension Mother     Social History Social History   Tobacco Use  . Smoking status: Never Smoker  . Smokeless tobacco: Never Used  Substance Use Topics  . Alcohol use: Never    Frequency: Never  . Drug use: Never     Allergies   Patient has no known allergies.   Review of Systems Review of Systems  Constitutional: Positive for chills and fever.  HENT: Negative for ear pain and sore throat.   Eyes: Negative for pain and visual disturbance.  Respiratory: Positive for cough and shortness of breath.  Cardiovascular: Negative for chest pain and palpitations.  Gastrointestinal: Negative for abdominal pain and vomiting.  Genitourinary: Negative for dysuria and hematuria.  Musculoskeletal: Negative for arthralgias and back pain.  Skin: Negative for color change and rash.  Neurological: Negative for seizures and syncope.  All other systems reviewed and are negative.    Physical Exam Updated Vital Signs  ED Triage Vitals  Enc Vitals Group     BP 08/17/19 1540 (!) 155/94     Pulse Rate 08/17/19 1540 (!) 102     Resp 08/17/19 1540 (!) 44     Temp 08/17/19 1540 (!) 103.1 F (39.5 C)     Temp Source 08/17/19 1540 Oral     SpO2 08/17/19 1512 92 %      Weight 08/17/19 1540 286 lb (129.7 kg)     Height 08/17/19 1540 5' 7"  (1.702 m)     Head Circumference --      Peak Flow --      Pain Score 08/17/19 1534 0     Pain Loc --      Pain Edu? --      Excl. in [place]? --     Physical Exam Vitals signs and nursing note reviewed.  Constitutional:      General: She is not in acute distress.    Appearance: She is well-developed. She is ill-appearing.  HENT:     Head: Normocephalic and atraumatic.     Nose: Nose normal.     Mouth/Throat:     Mouth: Mucous membranes are moist.  Eyes:     Extraocular Movements: Extraocular movements intact.     Conjunctiva/sclera: Conjunctivae normal.     Pupils: Pupils are equal, round, and reactive to light.  Neck:     Musculoskeletal: Neck supple.  Cardiovascular:     Rate and Rhythm: Regular rhythm. Tachycardia present.     Heart sounds: No murmur.  Pulmonary:     Effort: Respiratory distress present.     Breath sounds: Wheezing present.  Abdominal:     Palpations: Abdomen is soft.     Tenderness: There is no abdominal tenderness.  Musculoskeletal:     Right lower leg: No edema.     Left lower leg: No edema.  Skin:    General: Skin is warm and dry.     Capillary Refill: Capillary refill takes less than 2 seconds.  Neurological:     General: No focal deficit present.     Mental Status: She is alert.  Psychiatric:        Mood and Affect: Mood normal.      ED Treatments / Results  Labs (all labs ordered are listed, but only abnormal results are displayed) Labs Reviewed  CULTURE, BLOOD (ROUTINE X 2)  CULTURE, BLOOD (ROUTINE X 2)  URINE CULTURE  LACTIC ACID, PLASMA  LACTIC ACID, PLASMA  CBC WITH DIFFERENTIAL/PLATELET  COMPREHENSIVE METABOLIC PANEL  D-DIMER, QUANTITATIVE (NOT AT [facility])  PROCALCITONIN  LACTATE DEHYDROGENASE  FERRITIN  TRIGLYCERIDES  FIBRINOGEN  C-REACTIVE PROTEIN  URINALYSIS, ROUTINE W REFLEX MICROSCOPIC  I-STAT BETA HCG BLOOD, ED ([facility], [facility], [facility] ONLY)  POC SARS  CORONAVIRUS 2 AG -  ED    EKG None  Radiology No results found.  Procedures .Critical Care Performed by: Lennice Sites, DO Authorized by: Lennice Sites, DO   Critical care provider statement:    Critical care time (minutes):  45   Critical care was necessary to treat or prevent imminent or life-threatening deterioration of the following  conditions:  Respiratory failure and sepsis   Critical care was time spent personally by me on the following activities:  Blood draw for specimens, discussions with primary provider, development of treatment plan with patient or surrogate, evaluation of patient's response to treatment, examination of patient, obtaining history from patient or surrogate, ordering and performing treatments and interventions, ordering and review of laboratory studies, ordering and review of radiographic studies, pulse oximetry, re-evaluation of patient's condition and review of old charts   I assumed direction of critical care for this patient from another provider in my specialty: no     (including critical care time)  Medications Ordered in ED Medications  dexamethasone (DECADRON) injection 10 mg (10 mg Intravenous Given 08/17/19 1623)  ondansetron (ZOFRAN) injection 4 mg (4 mg Intravenous Given 08/17/19 1623)  acetaminophen (TYLENOL) tablet 1,000 mg (1,000 mg Oral Given 08/17/19 1623)     Initial Impression / Assessment and Plan / ED Course  I have reviewed the triage vital signs and the nursing notes.  Pertinent labs & imaging results that were available during my care of the patient were reviewed by me and considered in my medical decision making (see chart for details).     Winola Drum is a 43 year old female with history of hypertension who presents to the ED with fever, shortness of breath.  Tested positive for coronavirus earlier this week.  Patient is hypoxic.  Improved on 4 L of oxygen.  She is febrile, tachypneic.  Likely sepsis from coronavirus.  Will  give IV Decadron.  We'll get Covid screening labs, chest x-ray.  Anticipate admission.  Patient with chest x-ray consistent with Covid pneumonia.  Point-of-care rapid test is negative.  Confirmatory Covid test has been also ordered now.  Inflammatory markers are elevated.  Will admit for further care given hypoxia in the setting of Covid.  Patient septic but will hold antibiotics given likely viral process.  This chart was dictated using voice recognition software.  Despite best efforts to proofread,  errors can occur which can change the documentation meaning.  Niko Jakel was evaluated in Emergency Department on 08/17/2019 for the symptoms described in the history of present illness. She was evaluated in the context of the global COVID-19 pandemic, which necessitated consideration that the patient might be at risk for infection with the SARS-CoV-2 virus that causes COVID-19. Institutional protocols and algorithms that pertain to the evaluation of patients at risk for COVID-19 are in a state of rapid change based on information released by regulatory bodies including the CDC and federal and state organizations. These policies and algorithms were followed during the patient's care in the ED.   Final Clinical Impressions(s) / ED Diagnoses   Final diagnoses:  COVID-19  Acute respiratory failure with hypoxia (HCC)  Sepsis, due to unspecified organism, unspecified whether acute organ dysfunction present [facility])    ED Discharge Orders    None       Lennice Sites, DO 08/17/19 1725

## 2019-08-17 NOTE — Progress Notes (Signed)
Spoke with Megan from [facility], notified that patient needs positive Covid result prior to [facility] admission.

## 2019-08-18 ENCOUNTER — Inpatient Hospital Stay (HOSPITAL_COMMUNITY): Payer: Medicaid Other

## 2019-08-18 ENCOUNTER — Encounter (HOSPITAL_COMMUNITY): Payer: Self-pay

## 2019-08-18 DIAGNOSIS — U071 COVID-19: Secondary | ICD-10-CM

## 2019-08-18 DIAGNOSIS — R0902 Hypoxemia: Secondary | ICD-10-CM | POA: Diagnosis present

## 2019-08-18 DIAGNOSIS — E119 Type 2 diabetes mellitus without complications: Secondary | ICD-10-CM

## 2019-08-18 DIAGNOSIS — J1289 Other viral pneumonia: Secondary | ICD-10-CM

## 2019-08-18 LAB — GLUCOSE, CAPILLARY
Glucose-Capillary: 321 mg/dL — ABNORMAL HIGH (ref 70–99)
Glucose-Capillary: 266 mg/dL — ABNORMAL HIGH (ref 70–99)
Glucose-Capillary: 335 mg/dL — ABNORMAL HIGH (ref 70–99)
Glucose-Capillary: 447 mg/dL — ABNORMAL HIGH (ref 70–99)
Glucose-Capillary: 386 mg/dL — ABNORMAL HIGH (ref 70–99)

## 2019-08-18 LAB — COMPREHENSIVE METABOLIC PANEL
ALT: 66 U/L — ABNORMAL HIGH (ref 0–44)
AST: 86 U/L — ABNORMAL HIGH (ref 15–41)
Albumin: 3.5 g/dL (ref 3.5–5.0)
Alkaline Phosphatase: 70 U/L (ref 38–126)
Anion gap: 12 (ref 5–15)
BUN: 13 mg/dL (ref 6–20)
CO2: 27 mmol/L (ref 22–32)
Calcium: 8.5 mg/dL — ABNORMAL LOW (ref 8.9–10.3)
Chloride: 97 mmol/L — ABNORMAL LOW (ref 98–111)
Creatinine, Ser: 0.82 mg/dL (ref 0.44–1.00)
GFR calc Af Amer: >60 mL/min (ref >60)
GFR calc non Af Amer: >60 mL/min (ref >60)
Glucose, Bld: 321 mg/dL — ABNORMAL HIGH (ref 70–99)
Potassium: 4.1 mmol/L (ref 3.5–5.1)
Sodium: 136 mmol/L (ref 135–145)
Total Bilirubin: 1.7 mg/dL — ABNORMAL HIGH (ref 0.3–1.2)
Total Protein: 7.9 g/dL (ref 6.5–8.1)

## 2019-08-18 LAB — CBC WITH DIFFERENTIAL/PLATELET
Abs Immature Granulocytes: 0.05 10*3/uL (ref 0.00–0.07)
Basophils Absolute: 0 10*3/uL (ref 0.0–0.1)
Basophils Relative: 0 %
Eosinophils Absolute: 0 10*3/uL (ref 0.0–0.5)
Eosinophils Relative: 0 %
HCT: 39.6 % (ref 36.0–46.0)
Hemoglobin: 12.3 g/dL (ref 12.0–15.0)
Immature Granulocytes: 1 %
Lymphocytes Relative: 23 %
Lymphs Abs: 1.2 10*3/uL (ref 0.7–4.0)
MCH: 26.3 pg (ref 26.0–34.0)
MCHC: 31.1 g/dL (ref 30.0–36.0)
MCV: 84.8 fL (ref 80.0–100.0)
Monocytes Absolute: 0.2 10*3/uL (ref 0.1–1.0)
Monocytes Relative: 4 %
Neutro Abs: 3.8 10*3/uL (ref 1.7–7.7)
Neutrophils Relative %: 72 %
Platelets: 298 10*3/uL (ref 150–400)
RBC: 4.67 MIL/uL (ref 3.87–5.11)
RDW: 12 % (ref 11.5–15.5)
WBC: 5.3 10*3/uL (ref 4.0–10.5)
nRBC: 0 % (ref 0.0–0.2)

## 2019-08-18 LAB — URINALYSIS, ROUTINE W REFLEX MICROSCOPIC
Bilirubin Urine: NEGATIVE
Glucose, UA: >=500 mg/dL — AB
Hgb urine dipstick: NEGATIVE
Ketones, ur: 5 mg/dL — AB
Leukocytes,Ua: NEGATIVE
Nitrite: NEGATIVE
Protein, ur: 30 mg/dL — AB
Specific Gravity, Urine: 1.027 (ref 1.005–1.030)
pH: 5 (ref 5.0–8.0)

## 2019-08-18 LAB — PHOSPHORUS: Phosphorus: 2.8 mg/dL (ref 2.5–4.6)

## 2019-08-18 LAB — LACTATE DEHYDROGENASE: LDH: 463 U/L — ABNORMAL HIGH (ref 98–192)

## 2019-08-18 LAB — MAGNESIUM: Magnesium: 2.2 mg/dL (ref 1.7–2.4)

## 2019-08-18 LAB — C-REACTIVE PROTEIN: CRP: 4.6 mg/dL — ABNORMAL HIGH (ref <1.0)

## 2019-08-18 LAB — FERRITIN: Ferritin: 2078 ng/mL — ABNORMAL HIGH (ref 11–307)

## 2019-08-18 LAB — D-DIMER, QUANTITATIVE: D-Dimer, Quant: 1.15 ug/mL-FEU — ABNORMAL HIGH (ref 0.00–0.50)

## 2019-08-18 MED ORDER — TOCILIZUMAB 400 MG/20ML IV SOLN
800.0000 mg | Freq: Once | INTRAVENOUS | Status: AC
  Administered 2019-08-18: 800 mg via INTRAVENOUS
  Filled 2019-08-18 (×2): qty 40

## 2019-08-18 MED ORDER — PRO-STAT SUGAR FREE PO LIQD
30.0000 mL | Freq: Two times a day (BID) | ORAL | Status: DC
  Administered 2019-08-18 – 2019-08-21 (×8): 30 mL via ORAL
  Filled 2019-08-18 (×7): qty 30

## 2019-08-18 MED ORDER — INSULIN DETEMIR 100 UNIT/ML ~~LOC~~ SOLN
12.0000 [IU] | Freq: Two times a day (BID) | SUBCUTANEOUS | Status: DC
  Filled 2019-08-18 (×2): qty 0.12

## 2019-08-18 MED ORDER — ENSURE ENLIVE PO LIQD
237.0000 mL | Freq: Two times a day (BID) | ORAL | Status: DC
  Administered 2019-08-18: 237 mL via ORAL

## 2019-08-18 MED ORDER — ENOXAPARIN SODIUM 80 MG/0.8ML ~~LOC~~ SOLN
65.0000 mg | SUBCUTANEOUS | Status: DC
  Administered 2019-08-18 – 2019-08-21 (×4): 65 mg via SUBCUTANEOUS
  Filled 2019-08-18 (×5): qty 0.8

## 2019-08-18 MED ORDER — ADULT MULTIVITAMIN W/MINERALS CH
1.0000 | ORAL_TABLET | Freq: Every day | ORAL | Status: DC
  Administered 2019-08-18 – 2019-08-22 (×5): 1 via ORAL
  Filled 2019-08-18 (×5): qty 1

## 2019-08-18 MED ORDER — INSULIN DETEMIR 100 UNIT/ML ~~LOC~~ SOLN
15.0000 [IU] | Freq: Two times a day (BID) | SUBCUTANEOUS | Status: DC
  Filled 2019-08-18: qty 0.15

## 2019-08-18 MED ORDER — INSULIN ASPART 100 UNIT/ML ~~LOC~~ SOLN
4.0000 [IU] | Freq: Three times a day (TID) | SUBCUTANEOUS | Status: DC
  Administered 2019-08-18: 4 [IU] via SUBCUTANEOUS

## 2019-08-18 MED ORDER — GUAIFENESIN-DM 100-10 MG/5ML PO SYRP
5.0000 mL | ORAL_SOLUTION | ORAL | Status: DC | PRN
  Administered 2019-08-18: 5 mL via ORAL
  Filled 2019-08-18: qty 5

## 2019-08-18 MED ORDER — INSULIN DETEMIR 100 UNIT/ML ~~LOC~~ SOLN
15.0000 [IU] | Freq: Two times a day (BID) | SUBCUTANEOUS | Status: DC
  Administered 2019-08-18: 15 [IU] via SUBCUTANEOUS
  Filled 2019-08-18 (×2): qty 0.15

## 2019-08-18 NOTE — Plan of Care (Signed)
  Problem: Education: Goal: Knowledge of risk factors and measures for prevention of condition will improve Outcome: Progressing   Problem: Coping: Goal: Psychosocial and spiritual needs will be supported Outcome: Progressing   Problem: Respiratory: Goal: Will maintain a patent airway Outcome: Progressing Goal: Complications related to the disease process, condition or treatment will be avoided or minimized Outcome: Progressing   

## 2019-08-18 NOTE — Progress Notes (Signed)
Ok to increase lovenox to 0.5mg /kg/day per Dr Raelyn Mora.  Onnie Boer, PharmD, BCIDP, AAHIVP, CPP Infectious Disease Pharmacist 08/18/2019 12:34 PM

## 2019-08-18 NOTE — Progress Notes (Signed)
   08/18/19 1719  Vitals  ECG Heart Rate (!) 105  Resp (!) 23  MEWS Score  MEWS RR 1  MEWS Pulse 1  MEWS Systolic 0  MEWS LOC 0  MEWS Temp 0  MEWS Score 2  MEWS Score Color Yellow  MEWS Assessment  Is this an acute change? Yes  Provider Notification  Provider Name/Title Mellody Drown, MD  Date Provider Notified 08/18/19  Time Provider Notified 1719  Notification Type Page  Notification Reason Other (Comment) (yellow MEWS)  Response See new orders

## 2019-08-18 NOTE — Progress Notes (Signed)
Pt Received from ED. Pt stable with no signs of distress. Report previously taken from [facility]. Safety maintained.

## 2019-08-18 NOTE — Progress Notes (Signed)
Initial Nutrition Assessment  RD working remotely.   DOCUMENTATION CODES:   Morbid obesity  INTERVENTION:  - will order Ensure Enlive BID, each supplement provides 350 kcal and 20 grams of protein. - will order 30 mL Prostat BID, each supplement provides 100 kcal and 15 grams of protein. - Magic Cup BID with meals per [facility] ONS protocol, each supplement provides 290 kcal and 9 grams of protein. - will order daily multivitamin with minerals.    NUTRITION DIAGNOSIS:   Increased nutrient needs related to acute illness(COVID-19) as evidenced by estimated needs.  GOAL:   Patient will meet greater than or equal to 90% of their needs  MONITOR:   PO intake, Supplement acceptance, Labs, Weight trends  REASON FOR ASSESSMENT:   Malnutrition Screening Tool  ASSESSMENT:   43 y.o. female with medical history of HTN and morbid obesity. She presented to the ED with fever, SOB, cough, generalized weakness, loss of appetite, and loss of taste. She began to feel unwell on 12/3 and tested positive for COVID-19 on 12/6. For the two days PTA she felt noticeably worse than the days prior and began having diarrhea and abdominal discomfort. She was feeling weak and was unable to stand at home.  Patient consumed 50% of breakfast this AM. Patient reports good appetite at baseline but that she began to feel sick ~1 week ago and appetite significant decreased. She also has been experiencing lack of taste/poor taste sensation for at least the past 4-5 days which has led to a further decrease in desire to eat. Drinking is often easier.   Per chart review, current weight 286 lb and weight on 6/8 was 288 lb and weight on 12/26/18 was 297 lb. This indicates 11 lb weight loss (3.7% body weight) in the past 8 months; not significant for time frame.   DM Coordinator is following patient and last charted on her this AM.   Per notes: - [id number] PNA--started on remdesivir, not a candidate for actemra.    Labs  reviewed; CBGs: 321 and 266 mg/dl, Cl: 97 mmol/l, Ca: 8.5 mg/dl, LFTs elevated. Medications reviewed; 25 mg hydrodiuril/day, sliding scale novolog, 40 mg solu-medrol BID, daily multivitamin with minerals, 40 mEq Klor-Con x1 dose 12/8, 200 mg remdesivir x1 dose 12/8, 100 mg remdesivir x4 doses 12/9, 500 mg ascorbic acid BID, 200 mg zinc sulfate/day.    NUTRITION - FOCUSED PHYSICAL EXAM:  unable to complete at this time.   Diet Order:   Diet Order            Diet Heart Room service appropriate? Yes; Fluid consistency: Thin  Diet effective now              EDUCATION NEEDS:   No education needs have been identified at this time  Skin:  Skin Assessment: Reviewed RN Assessment  Last BM:  12/7  Height:   Ht Readings from Last 1 Encounters:  08/17/19 5\' 7"  (1.702 m)    Weight:   Wt Readings from Last 1 Encounters:  08/17/19 129.7 kg    Ideal Body Weight:  61.4 kg  BMI:  Body mass index is 44.79 kg/m.  Estimated Nutritional Needs:   Kcal:  1308-6578 kcal  Protein:  115-130 grams  Fluid:  >/= 2.2 L/day      Jarome Matin, MS, RD, LDN, [facility] Inpatient Clinical Dietitian Pager # [phone] After hours/weekend pager # [phone]

## 2019-08-18 NOTE — Progress Notes (Signed)
PROGRESS NOTE    Valerie Moss  ZOX:[id number] DOB: 1976-06-24 DOA: 08/17/2019 PCP: Kallie Locks, FNP    Brief Narrative:  Patient is a 43 year old female with history of hypertension, morbid obesity who presented to the emergency department with complaints of fever, shortness of breath, cough, loss of appetite and test for about 6 days duration.  For the last 2 days she was not feeling very well.  She had high-grade fever.  When found by EMS, she was saturating 70% on room air.  They put her on 6 L oxygen and brought to the ER. In the emergency room, patient was on 4 L of oxygen and saturating well.  Feeling extremely weak.  Chest x-ray showed patchy airspace infiltrates in lower lungs consistent with Covid pneumonia.  Inflammatory markers were elevated.  She was hyperglycemic.  Does have history of prediabetes.  Admit to the hospital with COVID-19 pneumonia with respiratory rate.   Assessment & Plan:   Principal Problem:   Pneumonia due to COVID-19 virus Active Problems:   COVID-19   Hypoxia   Type 2 diabetes mellitus without complication (HCC)  Pneumonia due to COVID-19 virus with hypoxia: Continue to monitor due to significant symptoms  chest physiotherapy, incentive spirometry, deep breathing exercises, sputum induction, mucolytic's and bronchodilators. Supplemental oxygen to keep saturations more than 90%. Covid directed therapy with , steroids, dexamethasone 10 mg once on 08/17/2019, Solu-Medrol 40 mg twice a day now. remdesivir, day 2/5. Vitamin C and zinc. antibiotics, not indicated. Due to severity of symptoms, patient will need daily inflammatory markers, chest x-rays, liver function test to monitor and direct COVID-19 therapies.  Hypertension: Blood pressures stable on current regimen of amlodipine and hydrochlorothiazide.  Continue.  Type 2 diabetes, new onset with hyperglycemia: Hemoglobin A1c 8.2.  Blood sugars more than 300. Patient does have diabetes, now  exacerbated with use of steroids. Blood sugars are greatly elevated, I start patient on twice a day long-acting insulin and prandial insulin as well as sliding scale coverage.  Morbid obesity: BMI more than 40.  She will definitely benefit with weight loss and lifestyle modification.   DVT prophylaxis: Lovenox subcu. Code Status: Full code Family Communication: None.  Patient communicating with family. Disposition Plan: Home after clinical improvement.   Consultants:   None  Procedures:   None  Antimicrobials:   Remdesivir, 08/17/2019>>>   Subjective: Patient seen and examined.  Complains of being very weak.  Temperature maximum 103.  On 4 L oxygen.  Has some nausea, however was able to eat some food.  Objective: Vitals:   08/18/19 0430 08/18/19 0435 08/18/19 0800 08/18/19 0801  BP:   128/79 128/79  Pulse:   91   Resp: (!) 26 19 (!) 26 20  Temp:   99 F (37.2 C)   TempSrc:   Oral   SpO2:   96%   Weight:      Height:        Intake/Output Summary (Last 24 hours) at 08/18/2019 1416 Last data filed at 08/18/2019 1000 Gross per 24 hour  Intake 648.09 ml  Output 500 ml  Net 148.09 ml   Filed Weights   08/17/19 1540  Weight: 129.7 kg    Examination:  General exam: Appears calm and comfortable on 4 L oxygen. Respiratory system: Clear to auscultation. Respiratory effort normal.  No added sounds. Cardiovascular system: S1 & S2 heard, RRR. No JVD, murmurs, rubs, gallops or clicks. No pedal edema. Gastrointestinal system: Abdomen is nondistended, soft and nontender. No  organomegaly or masses felt. Normal bowel sounds heard.  Obese and pendulous. Central nervous system: Alert and oriented. No focal neurological deficits. Extremities: Symmetric 5 x 5 power. Skin: No rashes, lesions or ulcers Psychiatry: Judgement and insight appear normal. Mood & affect anxious.    Data Reviewed: I have personally reviewed following labs and imaging studies  CBC: Recent Labs  Lab  08/17/19 1610 08/18/19 0507  WBC 5.0 5.3  NEUTROABS 3.6 3.8  HGB 13.1 12.3  HCT 40.4 39.6  MCV 84.0 84.8  PLT 292 846   Basic Metabolic Panel: Recent Labs  Lab 08/17/19 1610 08/18/19 0507  NA 136 136  K 3.3* 4.1  CL 96* 97*  CO2 29 27  GLUCOSE 223* 321*  BUN 7 13  CREATININE 0.81 0.82  CALCIUM 8.6* 8.5*  MG  --  2.2  PHOS  --  2.8   GFR: Estimated Creatinine Clearance: 125.3 mL/min (by C-G formula based on SCr of 0.82 mg/dL). Liver Function Tests: Recent Labs  Lab 08/17/19 1610 08/18/19 0507  AST 93* 86*  ALT 62* 66*  ALKPHOS 66 70  BILITOT 1.7* 1.7*  PROT 7.8 7.9  ALBUMIN 3.4* 3.5   No results for input(s): LIPASE, AMYLASE in the last 168 hours. No results for input(s): AMMONIA in the last 168 hours. Coagulation Profile: No results for input(s): INR, PROTIME in the last 168 hours. Cardiac Enzymes: No results for input(s): CKTOTAL, CKMB, CKMBINDEX, TROPONINI in the last 168 hours. BNP (last 3 results) No results for input(s): PROBNP in the last 8760 hours. HbA1C: Recent Labs    08/17/19 1610  HGBA1C 8.2*   CBG: Recent Labs  Lab 08/17/19 2330 08/18/19 0132 08/18/19 0819 08/18/19 1200  GLUCAP 314* 321* 266* 335*   Lipid Profile: Recent Labs    08/17/19 1552  TRIG 108   Thyroid Function Tests: No results for input(s): TSH, T4TOTAL, FREET4, T3FREE, THYROIDAB in the last 72 hours. Anemia Panel: Recent Labs    08/17/19 1610 08/18/19 0507  FERRITIN 2,670* 2,078*   Sepsis Labs: Recent Labs  Lab 08/17/19 1552 08/17/19 1610  PROCALCITON  --  <0.10  LATICACIDVEN 1.4  --     Recent Results (from the past 240 hour(s))  Blood Culture (routine x 2)     Status: None (Preliminary result)   Collection Time: 08/17/19  3:52 PM   Specimen: BLOOD LEFT FOREARM  Result Value Ref Range Status   Specimen Description   Final    BLOOD LEFT FOREARM Performed at [facility] [address] [place], [place] [address]    Special  Requests   Final    BOTTLES DRAWN AEROBIC AND ANAEROBIC Blood Culture adequate volume Performed at [facility] [address] [place], [place] [address]    Culture   Final    NO GROWTH < 24 HOURS Performed at [facility] [address] [place], [place] [address]    Report Status PENDING  Incomplete  Blood Culture (routine x 2)     Status: None (Preliminary result)   Collection Time: 08/17/19  3:52 PM   Specimen: BLOOD  Result Value Ref Range Status   Specimen Description   Final    BLOOD RIGHT ANTECUBITAL Performed at [facility], [place] [address] [place], [place] [address]    Special Requests   Final    BOTTLES DRAWN AEROBIC AND ANAEROBIC Blood Culture adequate volume Performed at [facility] [address] [place],  [place] [address]    Culture   Final    NO GROWTH < 24 HOURS Performed at [facility], [address], [place], [place] [address]    Report Status PENDING  Incomplete  Respiratory Panel by RT PCR (Flu A&B, Covid) - Nasopharyngeal Swab     Status: Abnormal   Collection Time: 08/17/19  6:19 PM   Specimen: Nasopharyngeal Swab  Result Value Ref Range Status   SARS Coronavirus 2 by RT PCR POSITIVE (A) NEGATIVE Final    Comment: RESULT CALLED TO, READ BACK BY AND VERIFIED WITH: B.BROOKS AT 1936 ON 08/17/19 BY N.THOMPSON (NOTE) SARS-CoV-2 target nucleic acids are DETECTED. SARS-CoV-2 RNA is generally detectable in upper respiratory specimens  during the acute phase of infection. Positive results are indicative of the presence of the identified virus, but do not rule out bacterial infection or co-infection with other pathogens not detected by the test. Clinical correlation with patient history and other diagnostic information is necessary to determine patient infection status. The expected result is Negative. Fact Sheet for Patients:  https://www.moore.com/ Fact Sheet for Healthcare  Providers: https://www.young.biz/ This test is not yet approved or cleared by the [place] FDA and  has been authorized for detection and/or diagnosis of SARS-CoV-2 by FDA under an Emergency Use Authorization (EUA).  This EUA will remain in effect (meaning this test can be u sed) for the duration of  the COVID-19 declaration under Section 564(b)(1) of the Act, 21 U.S.C. section 360bbb-3(b)(1), unless the authorization is terminated or revoked sooner.    Influenza A by PCR NEGATIVE NEGATIVE Final   Influenza B by PCR NEGATIVE NEGATIVE Final    Comment: (NOTE) The Xpert Xpress SARS-CoV-2/FLU/RSV assay is intended as an aid in  the diagnosis of influenza from Nasopharyngeal swab specimens and  should not be used as a sole basis for treatment. Nasal washings and  aspirates are unacceptable for Xpert Xpress SARS-CoV-2/FLU/RSV  testing. Fact Sheet for Patients: https://www.moore.com/ Fact Sheet for Healthcare Providers: https://www.young.biz/ This test is not yet approved or cleared by the [place] FDA and  has been authorized for detection and/or diagnosis of SARS-CoV-2 by  FDA under an Emergency Use Authorization (EUA). This EUA will remain  in effect (meaning this test can be used) for the duration of the  Covid-19 declaration under Section 564(b)(1) of the Act, 21  U.S.C. section 360bbb-3(b)(1), unless the authorization is  terminated or revoked. Performed at [id number], [address], [place], [place] [address]          Radiology Studies: Dg Chest Port 1 View  Result Date: 08/17/2019 CLINICAL DATA:  Fever 101.9 degrees, diarrhea, tested positive for COVID-19 on 08/15/2019 EXAM: PORTABLE CHEST 1 VIEW COMPARISON:  Portable exam 1629 hours compared to 12/31/2018 FINDINGS: Borderline enlargement of cardiac silhouette. Mediastinal contours normal. Patchy airspace infiltrates mid to lower LEFT  lung and at RIGHT base consistent with multifocal pneumonia and history of COVID-19. No pleural effusion or pneumothorax. Bones unremarkable. IMPRESSION: Patchy airspace infiltrates in the lower lungs consistent with COVID pneumonia. Electronically Signed   By: Ulyses Southward M.D.   On: 08/17/2019 16:52        Scheduled Meds: . amLODipine  10 mg Oral Daily  . enoxaparin (LOVENOX) injection  65 mg Subcutaneous Q24H  . feeding supplement (ENSURE ENLIVE)  237 mL Oral BID BM  . feeding supplement (PRO-STAT SUGAR FREE 64)  30 mL Oral BID  . hydrochlorothiazide  25 mg Oral Daily  .  insulin aspart  0-15 Units Subcutaneous TID WC  . insulin aspart  4 Units Subcutaneous TID WC  . insulin detemir  12 Units Subcutaneous BID  . methylPREDNISolone (SOLU-MEDROL) injection  40 mg Intravenous Q12H  . multivitamin with minerals  1 tablet Oral Daily  . vitamin C  500 mg Oral BID  . zinc sulfate  220 mg Oral Daily   Continuous Infusions: . remdesivir 100 mg in NS 100 mL 100 mg (08/18/19 0941)     LOS: 1 day    Time spent: 30 minutes    Dorcas CarrowKuber Ryett Hamman, MD [facility] Pager [phone]

## 2019-08-18 NOTE — Progress Notes (Signed)
   08/18/19 1430  Family/Significant Other Communication  Family/Significant Other Update Other (Comment) (Patient updated family)

## 2019-08-18 NOTE — Progress Notes (Signed)
   08/18/19 1600  Vitals  Temp 99.7 F (37.6 C)  Temp Source Oral  BP 115/67  MAP (mmHg) 80  BP Location Right Arm  BP Method Automatic  Patient Position (if appropriate) Lying  Pulse Rate 93  Pulse Rate Source Monitor  ECG Heart Rate 93  Resp (!) 26  MEWS Score  MEWS RR 2  MEWS Pulse 0  MEWS Systolic 0  MEWS LOC 0  MEWS Temp 0  MEWS Score 2  MEWS Score Color Yellow

## 2019-08-18 NOTE — Progress Notes (Signed)
Inpatient Diabetes Program Recommendations  AACE/ADA: New Consensus Statement on Inpatient Glycemic Control (2015)  Target Ranges:  Prepandial:   less than 140 mg/dL      Peak postprandial:   less than 180 mg/dL (1-2 hours)      Critically ill patients:  140 - 180 mg/dL   Lab Results  Component Value Date   GLUCAP 266 (H) 08/18/2019   HGBA1C 8.2 (H) 08/17/2019    Review of Glycemic Control Results for Valerie Moss, Valerie Moss (MRN [id number]) as of 08/18/2019 10:53  Ref. Range 08/17/2019 23:30 08/18/2019 01:32 08/18/2019 08:19  Glucose-Capillary Latest Ref Range: 70 - 99 mg/dL 314 (H) 321 (H) 266 (H)   Diabetes history: None (previous A1C=6.6% Outpatient Diabetes medications: None noted Current orders for Inpatient glycemic control:  Novolog moderate tid with meals Solumedrol 40 mg IV q 12 hours Inpatient Diabetes Program Recommendations:     Please consider adding Levemir 12 units bid (0.2 units/kg).  Consider adding Novolog 3 units tid with meals as well.  ? New diagnosis of DM.   Thanks,  Adah Perl, RN, BC-ADM Inpatient Diabetes Coordinator Pager [phone] (8a-5p)

## 2019-08-19 LAB — URINE CULTURE

## 2019-08-19 LAB — COMPREHENSIVE METABOLIC PANEL
ALT: 63 U/L — ABNORMAL HIGH (ref 0–44)
AST: 55 U/L — ABNORMAL HIGH (ref 15–41)
Albumin: 3.2 g/dL — ABNORMAL LOW (ref 3.5–5.0)
Alkaline Phosphatase: 68 U/L (ref 38–126)
Anion gap: 13 (ref 5–15)
BUN: 20 mg/dL (ref 6–20)
CO2: 29 mmol/L (ref 22–32)
Calcium: 9 mg/dL (ref 8.9–10.3)
Chloride: 92 mmol/L — ABNORMAL LOW (ref 98–111)
Creatinine, Ser: 0.85 mg/dL (ref 0.44–1.00)
GFR calc Af Amer: >60 mL/min (ref >60)
GFR calc non Af Amer: >60 mL/min (ref >60)
Glucose, Bld: 369 mg/dL — ABNORMAL HIGH (ref 70–99)
Potassium: 4.3 mmol/L (ref 3.5–5.1)
Sodium: 134 mmol/L — ABNORMAL LOW (ref 135–145)
Total Bilirubin: 1 mg/dL (ref 0.3–1.2)
Total Protein: 7.5 g/dL (ref 6.5–8.1)

## 2019-08-19 LAB — CBC WITH DIFFERENTIAL/PLATELET
Abs Immature Granulocytes: 0.08 10*3/uL — ABNORMAL HIGH (ref 0.00–0.07)
Basophils Absolute: 0 10*3/uL (ref 0.0–0.1)
Basophils Relative: 1 %
Eosinophils Absolute: 0 10*3/uL (ref 0.0–0.5)
Eosinophils Relative: 0 %
HCT: 39.7 % (ref 36.0–46.0)
Hemoglobin: 12.2 g/dL (ref 12.0–15.0)
Immature Granulocytes: 1 %
Lymphocytes Relative: 22 %
Lymphs Abs: 1.5 10*3/uL (ref 0.7–4.0)
MCH: 26.1 pg (ref 26.0–34.0)
MCHC: 30.7 g/dL (ref 30.0–36.0)
MCV: 85 fL (ref 80.0–100.0)
Monocytes Absolute: 0.4 10*3/uL (ref 0.1–1.0)
Monocytes Relative: 6 %
Neutro Abs: 4.9 10*3/uL (ref 1.7–7.7)
Neutrophils Relative %: 70 %
Platelets: 323 10*3/uL (ref 150–400)
RBC: 4.67 MIL/uL (ref 3.87–5.11)
RDW: 11.9 % (ref 11.5–15.5)
WBC: 6.9 10*3/uL (ref 4.0–10.5)
nRBC: 0 % (ref 0.0–0.2)

## 2019-08-19 LAB — FERRITIN
Ferritin: 1348 ng/mL — ABNORMAL HIGH (ref 11–307)
Ferritin: 1434 ng/mL — ABNORMAL HIGH (ref 11–307)

## 2019-08-19 LAB — GLUCOSE, CAPILLARY
Glucose-Capillary: 348 mg/dL — ABNORMAL HIGH (ref 70–99)
Glucose-Capillary: 369 mg/dL — ABNORMAL HIGH (ref 70–99)
Glucose-Capillary: 368 mg/dL — ABNORMAL HIGH (ref 70–99)
Glucose-Capillary: 386 mg/dL — ABNORMAL HIGH (ref 70–99)
Glucose-Capillary: 307 mg/dL — ABNORMAL HIGH (ref 70–99)

## 2019-08-19 LAB — LACTATE DEHYDROGENASE: LDH: 395 U/L — ABNORMAL HIGH (ref 98–192)

## 2019-08-19 LAB — PHOSPHORUS: Phosphorus: 2.9 mg/dL (ref 2.5–4.6)

## 2019-08-19 LAB — MAGNESIUM: Magnesium: 2.3 mg/dL (ref 1.7–2.4)

## 2019-08-19 LAB — C-REACTIVE PROTEIN
CRP: 1.5 mg/dL — ABNORMAL HIGH (ref <1.0)
CRP: 1.7 mg/dL — ABNORMAL HIGH (ref <1.0)

## 2019-08-19 LAB — D-DIMER, QUANTITATIVE: D-Dimer, Quant: 0.74 ug/mL-FEU — ABNORMAL HIGH (ref 0.00–0.50)

## 2019-08-19 MED ORDER — INSULIN ASPART 100 UNIT/ML ~~LOC~~ SOLN
8.0000 [IU] | Freq: Three times a day (TID) | SUBCUTANEOUS | Status: DC
  Administered 2019-08-19 – 2019-08-22 (×10): 8 [IU] via SUBCUTANEOUS

## 2019-08-19 MED ORDER — INSULIN DETEMIR 100 UNIT/ML ~~LOC~~ SOLN
20.0000 [IU] | Freq: Two times a day (BID) | SUBCUTANEOUS | Status: DC
  Administered 2019-08-19 – 2019-08-20 (×3): 20 [IU] via SUBCUTANEOUS
  Filled 2019-08-19 (×3): qty 0.2

## 2019-08-19 NOTE — Progress Notes (Signed)
   08/19/19 1741  Family/Significant Other Communication  Family/Significant Other Update Other (Comment) (patient updating family)

## 2019-08-19 NOTE — Progress Notes (Signed)
PROGRESS NOTE    Valerie Moss  RUE:[id number] DOB: [birth year] DOA: 08/17/2019 PCP: Kallie LocksStroud, Natalie M, FNP    Brief Narrative:  Patient is a 43 year old female with history of hypertension, pre-diabetes, morbid obesity who presented to the emergency department with complaints of fever, shortness of breath, cough, loss of appetite and test for about 6 days duration.  For the last 2 days she was not feeling very well.  She had high-grade fever.  When found by EMS, she was saturating 70% on room air.  They put her on 6 L oxygen and brought to the ER. In the emergency room, patient was on 4 L of oxygen and saturating well.  Feeling extremely weak.  Chest x-ray showed patchy airspace infiltrates in lower lungs consistent with Covid pneumonia.  Inflammatory markers were elevated.  She was hyperglycemic.  Does have history of prediabetes.  Admittedto the hospital with COVID-19 pneumonia with hypoxia.   Assessment & Plan:   Principal Problem:   Pneumonia due to COVID-19 virus Active Problems:   COVID-19   Hypoxia   Type 2 diabetes mellitus without complication (HCC)  Pneumonia due to COVID-19 virus with hypoxia: Continue to monitor due to significant symptoms  chest physiotherapy, incentive spirometry, deep breathing exercises, sputum induction, mucolytic's and bronchodilators. Supplemental oxygen to keep saturations more than 90%. Covid directed therapy with , steroids, dexamethasone 10 mg once on 08/17/2019, Solu-Medrol 40 mg twice a day now. remdesivir, day 3/5. Actemra, single dose 08/18/2019 Vitamin C and zinc. antibiotics, not indicated. Due to severity of symptoms, patient will need daily inflammatory markers, electrolytes, liver function test to monitor and direct COVID-19 therapies.  Hypertension: Blood pressures stable on current regimen of amlodipine and hydrochlorothiazide.  Continue.  Type 2 diabetes, new onset with hyperglycemia: Hemoglobin A1c 8.2.  Blood sugars more than  300. Patient does have diabetes, now exacerbated with use of steroids. Blood sugars are greatly elevated, further increase dose of insulin today.    Morbid obesity: BMI more than 40.  She will definitely benefit with weight loss and lifestyle modification.   DVT prophylaxis: Lovenox subcu. Code Status: Full code Family Communication: None.  Patient communicating with family. Disposition Plan: Home after clinical improvement.   Consultants:   None  Procedures:   None  Antimicrobials:   Remdesivir, 08/17/2019>>>   Subjective: Patient seen and examined.  Breathing somehow improved than yesterday.  She was able to ambulate with 1 to 2 L of oxygen.  Remains afebrile. Objective: Vitals:   08/18/19 1930 08/19/19 0405 08/19/19 0715 08/19/19 0800  BP: 115/66 119/65  120/76  Pulse: 90 84  88  Resp: (!) 21 (!) 24  20  Temp: 99.2 F (37.3 C) 99.3 F (37.4 C)  97.8 F (36.6 C)  TempSrc: Oral Oral  Oral  SpO2: 93% 99% 100% 100%  Weight:      Height:        Intake/Output Summary (Last 24 hours) at 08/19/2019 1253 Last data filed at 08/19/2019 0907 Gross per 24 hour  Intake 198.68 ml  Output 700 ml  Net -501.32 ml   Filed Weights   08/17/19 1540  Weight: 129.7 kg    Examination:  General exam: Appears calm and comfortable on 2 L oxygen.  She was eating breakfast in the morning. Respiratory system: Patient has some expiratory wheezes.  Fairly comfortable looking. Cardiovascular system: S1 & S2 heard, RRR. No JVD, murmurs, rubs, gallops or clicks. No pedal edema. Gastrointestinal system: Abdomen is nondistended, soft and nontender. No  organomegaly or masses felt. Normal bowel sounds heard.  Obese and pendulous. Central nervous system: Alert and oriented. No focal neurological deficits. Extremities: Symmetric 5 x 5 power. Skin: No rashes, lesions or ulcers Psychiatry: Judgement and insight appear normal. Mood & affect anxious.    Data Reviewed: I have personally  reviewed following labs and imaging studies  CBC: Recent Labs  Lab 08/17/19 1610 08/18/19 0507 08/19/19 0004  WBC 5.0 5.3 6.9  NEUTROABS 3.6 3.8 4.9  HGB 13.1 12.3 12.2  HCT 40.4 39.6 39.7  MCV 84.0 84.8 85.0  PLT 292 298 323   Basic Metabolic Panel: Recent Labs  Lab 08/17/19 1610 08/18/19 0507 08/19/19 0004  NA 136 136 134*  K 3.3* 4.1 4.3  CL 96* 97* 92*  CO2 29 27 29   GLUCOSE 223* 321* 369*  BUN 7 13 20   CREATININE 0.81 0.82 0.85  CALCIUM 8.6* 8.5* 9.0  MG  --  2.2 2.3  PHOS  --  2.8 2.9   GFR: Estimated Creatinine Clearance: 120.9 mL/min (by C-G formula based on SCr of 0.85 mg/dL). Liver Function Tests: Recent Labs  Lab 08/17/19 1610 08/18/19 0507 08/19/19 0004  AST 93* 86* 55*  ALT 62* 66* 63*  ALKPHOS 66 70 68  BILITOT 1.7* 1.7* 1.0  PROT 7.8 7.9 7.5  ALBUMIN 3.4* 3.5 3.2*   No results for input(s): LIPASE, AMYLASE in the last 168 hours. No results for input(s): AMMONIA in the last 168 hours. Coagulation Profile: No results for input(s): INR, PROTIME in the last 168 hours. Cardiac Enzymes: No results for input(s): CKTOTAL, CKMB, CKMBINDEX, TROPONINI in the last 168 hours. BNP (last 3 results) No results for input(s): PROBNP in the last 8760 hours. HbA1C: Recent Labs    08/17/19 1610  HGBA1C 8.2*   CBG: Recent Labs  Lab 08/18/19 1200 08/18/19 1602 08/18/19 2033 08/19/19 0740 08/19/19 1139  GLUCAP 335* 447* 386* 348* 369*   Lipid Profile: Recent Labs    08/17/19 1552  TRIG 108   Thyroid Function Tests: No results for input(s): TSH, T4TOTAL, FREET4, T3FREE, THYROIDAB in the last 72 hours. Anemia Panel: Recent Labs    08/18/19 0507 08/19/19 0004  FERRITIN 2,078* 1,348*  1,434*   Sepsis Labs: Recent Labs  Lab 08/17/19 1552 08/17/19 1610  PROCALCITON  --  <0.10  LATICACIDVEN 1.4  --     Recent Results (from the past 240 hour(s))  Blood Culture (routine x 2)     Status: None (Preliminary result)   Collection Time:  08/17/19  3:52 PM   Specimen: BLOOD LEFT FOREARM  Result Value Ref Range Status   Specimen Description   Final    BLOOD LEFT FOREARM Performed at [facility], [address] [place], [place]    Special Requests   Final    BOTTLES DRAWN AEROBIC AND ANAEROBIC Blood Culture adequate volume Performed at [facility], [address] [place], [place]    Culture   Final    NO GROWTH 2 DAYS Performed at [facility], [address] [place], [address] [place]    Report Status PENDING  Incomplete  Blood Culture (routine x 2)     Status: None (Preliminary result)   Collection Time: 08/17/19  3:52 PM   Specimen: BLOOD  Result Value Ref Range Status   Specimen Description   Final    BLOOD RIGHT ANTECUBITAL Performed at [facility], [address] [place], [address]  [address]    Special Requests   Final    BOTTLES DRAWN AEROBIC AND ANAEROBIC Blood Culture adequate volume Performed at [place] [address], [place], [place] [address]    Culture   Final    NO GROWTH 2 DAYS Performed at [place] [address], [place], [place] [address]    Report Status PENDING  Incomplete  Respiratory Panel by RT PCR (Flu A&B, Covid) - Nasopharyngeal Swab     Status: Abnormal   Collection Time: 08/17/19  6:19 PM   Specimen: Nasopharyngeal Swab  Result Value Ref Range Status   SARS Coronavirus 2 by RT PCR POSITIVE (A) NEGATIVE Final    Comment: RESULT CALLED TO, READ BACK BY AND VERIFIED WITH: B.BROOKS AT 1936 ON 08/17/19 BY N.THOMPSON (NOTE) SARS-CoV-2 target nucleic acids are DETECTED. SARS-CoV-2 RNA is generally detectable in upper respiratory specimens  during the acute phase of infection. Positive results are indicative of the presence of the identified virus, but do not rule out bacterial infection or co-infection with other pathogens not detected by the test. Clinical correlation with  patient history and other diagnostic information is necessary to determine patient infection status. The expected result is Negative. Fact Sheet for Patients:  PinkCheek.be Fact Sheet for Healthcare Providers: GravelBags.it This test is not yet approved or cleared by the [place] FDA and  has been authorized for detection and/or diagnosis of SARS-CoV-2 by FDA under an Emergency Use Authorization (EUA).  This EUA will remain in effect (meaning this test can be u sed) for the duration of  the COVID-19 declaration under Section 564(b)(1) of the Act, 21 U.S.C. section 360bbb-3(b)(1), unless the authorization is terminated or revoked sooner.    Influenza A by PCR NEGATIVE NEGATIVE Final   Influenza B by PCR NEGATIVE NEGATIVE Final    Comment: (NOTE) The Xpert Xpress SARS-CoV-2/FLU/RSV assay is intended as an aid in  the diagnosis of influenza from Nasopharyngeal swab specimens and  should not be used as a sole basis for treatment. Nasal washings and  aspirates are unacceptable for Xpert Xpress SARS-CoV-2/FLU/RSV  testing. Fact Sheet for Patients: PinkCheek.be Fact Sheet for Healthcare Providers: GravelBags.it This test is not yet approved or cleared by the [place] FDA and  has been authorized for detection and/or diagnosis of SARS-CoV-2 by  FDA under an Emergency Use Authorization (EUA). This EUA will remain  in effect (meaning this test can be used) for the duration of the  Covid-19 declaration under Section 564(b)(1) of the Act, 21  U.S.C. section 360bbb-3(b)(1), unless the authorization is  terminated or revoked. Performed at [facility], [place] [address], [place], [place] [address]   Urine culture     Status: Abnormal   Collection Time: 08/18/19  4:30 AM   Specimen: Urine, Random  Result Value Ref Range Status   Specimen Description    Final    URINE, RANDOM Performed at [place] [address] [place], [place] [address]    Special Requests   Final    NONE Performed at [facility], [place] [address], [place], [place] [address]    Culture MULTIPLE SPECIES PRESENT, SUGGEST RECOLLECTION (A)  Final   Report Status 08/19/2019 FINAL  Final         Radiology Studies: DG CHEST PORT 1 VIEW  Result Date: 08/18/2019 CLINICAL DATA:  Pneumonia, increasing oxygen requirement EXAM: PORTABLE CHEST 1 VIEW COMPARISON:  Radiograph 08/17/2019 FINDINGS: Increasing peripheral and  basilar airspace opacities with diminishing lung volumes. Cardiomegaly is similar to prior. No pneumothorax or effusion. No acute osseous or soft tissue abnormality. IMPRESSION: 1. Increasing peripheral and basilar airspace opacities with diminishing lung volumes. 2. Stable cardiomegaly without overt edema or effusion. Electronically Signed   By: Kreg Shropshire M.D.   On: 08/18/2019 19:01   DG Chest Port 1 View  Result Date: 08/17/2019 CLINICAL DATA:  Fever 101.9 degrees, diarrhea, tested positive for COVID-19 on 08/15/2019 EXAM: PORTABLE CHEST 1 VIEW COMPARISON:  Portable exam 1629 hours compared to 12/31/2018 FINDINGS: Borderline enlargement of cardiac silhouette. Mediastinal contours normal. Patchy airspace infiltrates mid to lower LEFT lung and at RIGHT base consistent with multifocal pneumonia and history of COVID-19. No pleural effusion or pneumothorax. Bones unremarkable. IMPRESSION: Patchy airspace infiltrates in the lower lungs consistent with COVID pneumonia. Electronically Signed   By: Ulyses Southward M.D.   On: 08/17/2019 16:52        Scheduled Meds: . amLODipine  10 mg Oral Daily  . enoxaparin (LOVENOX) injection  65 mg Subcutaneous Q24H  . feeding supplement (PRO-STAT SUGAR FREE 64)  30 mL Oral BID  . hydrochlorothiazide  25 mg Oral Daily  . insulin aspart  0-15 Units Subcutaneous TID WC  . insulin aspart  8  Units Subcutaneous TID WC  . insulin detemir  20 Units Subcutaneous BID  . methylPREDNISolone (SOLU-MEDROL) injection  40 mg Intravenous Q12H  . multivitamin with minerals  1 tablet Oral Daily  . vitamin C  500 mg Oral BID  . zinc sulfate  220 mg Oral Daily   Continuous Infusions: . remdesivir 100 mg in NS 100 mL 100 mg (08/19/19 0907)     LOS: 2 days    Time spent: 30 minutes    Dorcas Carrow, MD [facility] Pager [phone]

## 2019-08-20 LAB — BASIC METABOLIC PANEL
Anion gap: 13 (ref 5–15)
BUN: 23 mg/dL — ABNORMAL HIGH (ref 6–20)
CO2: 26 mmol/L (ref 22–32)
Calcium: 8.6 mg/dL — ABNORMAL LOW (ref 8.9–10.3)
Chloride: 91 mmol/L — ABNORMAL LOW (ref 98–111)
Creatinine, Ser: 0.97 mg/dL (ref 0.44–1.00)
GFR calc Af Amer: >60 mL/min (ref >60)
GFR calc non Af Amer: >60 mL/min (ref >60)
Glucose, Bld: 397 mg/dL — ABNORMAL HIGH (ref 70–99)
Potassium: 5.1 mmol/L (ref 3.5–5.1)
Sodium: 130 mmol/L — ABNORMAL LOW (ref 135–145)

## 2019-08-20 LAB — CBC WITH DIFFERENTIAL/PLATELET
Abs Immature Granulocytes: 0.37 10*3/uL — ABNORMAL HIGH (ref 0.00–0.07)
Basophils Absolute: 0.1 10*3/uL (ref 0.0–0.1)
Basophils Relative: 1 %
Eosinophils Absolute: 0 10*3/uL (ref 0.0–0.5)
Eosinophils Relative: 0 %
HCT: 38.7 % (ref 36.0–46.0)
Hemoglobin: 12.4 g/dL (ref 12.0–15.0)
Immature Granulocytes: 3 %
Lymphocytes Relative: 20 %
Lymphs Abs: 2.2 10*3/uL (ref 0.7–4.0)
MCH: 27.1 pg (ref 26.0–34.0)
MCHC: 32 g/dL (ref 30.0–36.0)
MCV: 84.5 fL (ref 80.0–100.0)
Monocytes Absolute: 0.5 10*3/uL (ref 0.1–1.0)
Monocytes Relative: 5 %
Neutro Abs: 7.7 10*3/uL (ref 1.7–7.7)
Neutrophils Relative %: 71 %
Platelets: 417 10*3/uL — ABNORMAL HIGH (ref 150–400)
RBC: 4.58 MIL/uL (ref 3.87–5.11)
RDW: 11.9 % (ref 11.5–15.5)
WBC: 10.9 10*3/uL — ABNORMAL HIGH (ref 4.0–10.5)
nRBC: 0.2 % (ref 0.0–0.2)

## 2019-08-20 LAB — COMPREHENSIVE METABOLIC PANEL
ALT: 52 U/L — ABNORMAL HIGH (ref 0–44)
AST: 31 U/L (ref 15–41)
Albumin: 3.1 g/dL — ABNORMAL LOW (ref 3.5–5.0)
Alkaline Phosphatase: 68 U/L (ref 38–126)
Anion gap: 15 (ref 5–15)
BUN: 21 mg/dL — ABNORMAL HIGH (ref 6–20)
CO2: 30 mmol/L (ref 22–32)
Calcium: 8.7 mg/dL — ABNORMAL LOW (ref 8.9–10.3)
Chloride: 88 mmol/L — ABNORMAL LOW (ref 98–111)
Creatinine, Ser: 0.78 mg/dL (ref 0.44–1.00)
GFR calc Af Amer: >60 mL/min (ref >60)
GFR calc non Af Amer: >60 mL/min (ref >60)
Glucose, Bld: 355 mg/dL — ABNORMAL HIGH (ref 70–99)
Potassium: 4.1 mmol/L (ref 3.5–5.1)
Sodium: 133 mmol/L — ABNORMAL LOW (ref 135–145)
Total Bilirubin: 0.7 mg/dL (ref 0.3–1.2)
Total Protein: 7.2 g/dL (ref 6.5–8.1)

## 2019-08-20 LAB — PHOSPHORUS: Phosphorus: 4 mg/dL (ref 2.5–4.6)

## 2019-08-20 LAB — FERRITIN: Ferritin: 1013 ng/mL — ABNORMAL HIGH (ref 11–307)

## 2019-08-20 LAB — GLUCOSE, CAPILLARY
Glucose-Capillary: 345 mg/dL — ABNORMAL HIGH (ref 70–99)
Glucose-Capillary: 452 mg/dL — ABNORMAL HIGH (ref 70–99)
Glucose-Capillary: 356 mg/dL — ABNORMAL HIGH (ref 70–99)
Glucose-Capillary: 355 mg/dL — ABNORMAL HIGH (ref 70–99)

## 2019-08-20 LAB — D-DIMER, QUANTITATIVE: D-Dimer, Quant: 0.4 ug/mL-FEU (ref 0.00–0.50)

## 2019-08-20 LAB — LACTATE DEHYDROGENASE: LDH: 315 U/L — ABNORMAL HIGH (ref 98–192)

## 2019-08-20 LAB — MAGNESIUM: Magnesium: 2.3 mg/dL (ref 1.7–2.4)

## 2019-08-20 LAB — C-REACTIVE PROTEIN: CRP: 0.6 mg/dL (ref <1.0)

## 2019-08-20 MED ORDER — INSULIN DETEMIR 100 UNIT/ML ~~LOC~~ SOLN
25.0000 [IU] | Freq: Two times a day (BID) | SUBCUTANEOUS | Status: DC
  Administered 2019-08-20 – 2019-08-22 (×4): 25 [IU] via SUBCUTANEOUS
  Filled 2019-08-20 (×6): qty 0.25

## 2019-08-20 MED ORDER — DEXAMETHASONE 6 MG PO TABS
6.0000 mg | ORAL_TABLET | Freq: Every day | ORAL | Status: DC
  Administered 2019-08-21 – 2019-08-22 (×2): 6 mg via ORAL
  Filled 2019-08-20 (×2): qty 1

## 2019-08-20 MED ORDER — BUTALBITAL-APAP-CAFFEINE 50-325-40 MG PO TABS: 1.0000 | ORAL_TABLET | Freq: Four times a day (QID) | ORAL | Status: DC | PRN

## 2019-08-20 MED ORDER — INSULIN ASPART 100 UNIT/ML ~~LOC~~ SOLN
0.0000 [IU] | Freq: Three times a day (TID) | SUBCUTANEOUS | Status: DC
  Administered 2019-08-20 (×2): 20 [IU] via SUBCUTANEOUS
  Administered 2019-08-21: 15 [IU] via SUBCUTANEOUS
  Administered 2019-08-21: 4 [IU] via SUBCUTANEOUS
  Administered 2019-08-21: 20 [IU] via SUBCUTANEOUS
  Administered 2019-08-22: 7 [IU] via SUBCUTANEOUS

## 2019-08-20 MED ORDER — SODIUM CHLORIDE 0.9 % IV BOLUS
1000.0000 mL | Freq: Once | INTRAVENOUS | Status: AC
  Administered 2019-08-20: 1000 mL via INTRAVENOUS

## 2019-08-20 MED ORDER — ASPIRIN-ACETAMINOPHEN-CAFFEINE 250-250-65 MG PO TABS
1.0000 | ORAL_TABLET | Freq: Four times a day (QID) | ORAL | Status: DC | PRN
  Administered 2019-08-20: 1 via ORAL
  Filled 2019-08-20 (×2): qty 1

## 2019-08-20 NOTE — Progress Notes (Signed)
PROGRESS NOTE    Valerie Moss  WUJ:[id number] DOB: 05/31/1976 DOA: 08/17/2019 PCP: Kallie LocksStroud, Natalie M, FNP    Brief Narrative:  Patient is a 43 year old female with history of hypertension, pre-diabetes, morbid obesity who presented to the emergency department with complaints of fever, shortness of breath, cough, loss of appetite and test for about 6 days duration.  For the last 2 days she was not feeling very well.  She had high-grade fever.  When found by EMS, she was saturating 70% on room air.  They put her on 6 L oxygen and brought to the ER. In the emergency room, patient was on 4 L of oxygen and saturating well.  Feeling extremely weak.  Chest x-ray showed patchy airspace infiltrates in lower lungs consistent with Covid pneumonia.  Inflammatory markers were elevated.  She was hyperglycemic.  Does have history of prediabetes.  Admitted to the hospital with COVID-19 pneumonia with hypoxia.   Assessment & Plan:   Principal Problem:   Pneumonia due to COVID-19 virus Active Problems:   COVID-19   Hypoxia   Type 2 diabetes mellitus without complication (HCC)  Pneumonia due to COVID-19 virus with hypoxia: Continue to monitor due to significant symptoms.  chest physiotherapy, incentive spirometry, deep breathing exercises, sputum induction, mucolytic's and bronchodilators. Supplemental oxygen to keep saturations more than 90%. Covid directed therapy with , steroids, dexamethasone 10 mg once on 08/17/2019, Solu-Medrol 40 mg twice a day now. Changed to dexamethasone 6 mg by mouth starting tomorrow.  Will continue to cut down on steroid doses. remdesivir, day 4/5. Actemra, single dose 08/18/2019 Vitamin C and zinc. antibiotics, not indicated. Due to severity of symptoms, patient will need daily inflammatory markers, electrolytes, liver function test to monitor and direct COVID-19 therapies.  Hypertension: Blood pressures stable on current regimen of amlodipine and hydrochlorothiazide.   Continue.  Type 2 diabetes, new onset with hyperglycemia: Hemoglobin A1c 8.2. Blood sugars uncontrolled.  More than 400.  Patient does have diabetes, now exacerbated with use of steroids. Blood sugars are greatly elevated, further increase dose of insulin today.   Given blood sugar ranges, patient will be discharged home with insulin, she will need insulin education, nursing staff to teach about insulin techniques. Patient has been eating today with no nausea, hopefully subcu insulin will be able to control her blood sugars. 1 L normal saline bolus now.  20 units of regular insulin.  If no improvement, may need insulin infusion.  Will recheck BMP in afternoon.  Morbid obesity: BMI more than 40.  She will definitely benefit with weight loss and lifestyle modification.   DVT prophylaxis: Lovenox subcu. Code Status: Full code Family Communication: None.  Patient communicating with family. Disposition Plan: Home after clinical improvement.  Anticipate tomorrow.   Consultants:   None  Procedures:   None  Antimicrobials:   Remdesivir, 08/17/2019>>>   Subjective: Patient seen and examined.  Breathing is somewhat better than yesterday.  She is able to maintain on 1 to 2 L of oxygen and even on room air while completely resting.  Remains afebrile.  Blood sugars more than 400.  No more nausea. Objective: Vitals:   08/20/19 0717 08/20/19 0905 08/20/19 1124 08/20/19 1142  BP: 128/77  123/80   Pulse: 82  (!) 101 98  Resp: (!) 24  20 (!) 22  Temp: 98.2 F (36.8 C)  98.2 F (36.8 C)   TempSrc: Oral  Oral   SpO2: 93% 91% 98% 99%  Weight:      Height:  Intake/Output Summary (Last 24 hours) at 08/20/2019 1157 Last data filed at 08/20/2019 0900 Gross per 24 hour  Intake 1160 ml  Output 1400 ml  Net -240 ml   Filed Weights   08/17/19 1540  Weight: 129.7 kg    Examination:  General exam: Appears calm and comfortable on 1-2 L oxygen.  She was eating breakfast in the  morning. Respiratory system: Patient has some expiratory wheezes.  Fairly comfortable looking. Cardiovascular system: S1 & S2 heard, RRR. No JVD, murmurs, rubs, gallops or clicks. No pedal edema. Gastrointestinal system: Abdomen is nondistended, soft and nontender. No organomegaly or masses felt. Normal bowel sounds heard.  Obese and pendulous. Central nervous system: Alert and oriented. No focal neurological deficits. Extremities: Symmetric 5 x 5 power. Skin: No rashes, lesions or ulcers Psychiatry: Judgement and insight appear normal. Mood & affect anxious.    Data Reviewed: I have personally reviewed following labs and imaging studies  CBC: Recent Labs  Lab 08/17/19 1610 08/18/19 0507 08/19/19 0004 08/20/19 0318  WBC 5.0 5.3 6.9 10.9*  NEUTROABS 3.6 3.8 4.9 7.7  HGB 13.1 12.3 12.2 12.4  HCT 40.4 39.6 39.7 38.7  MCV 84.0 84.8 85.0 84.5  PLT 292 298 323 417*   Basic Metabolic Panel: Recent Labs  Lab 08/17/19 1610 08/18/19 0507 08/19/19 0004 08/20/19 0318  NA 136 136 134* 133*  K 3.3* 4.1 4.3 4.1  CL 96* 97* 92* 88*  CO2 29 27 29 30   GLUCOSE 223* 321* 369* 355*  BUN 7 13 20  21*  CREATININE 0.81 0.82 0.85 0.78  CALCIUM 8.6* 8.5* 9.0 8.7*  MG  --  2.2 2.3 2.3  PHOS  --  2.8 2.9 4.0   GFR: Estimated Creatinine Clearance: 128.4 mL/min (by C-G formula based on SCr of 0.78 mg/dL). Liver Function Tests: Recent Labs  Lab 08/17/19 1610 08/18/19 0507 08/19/19 0004 08/20/19 0318  AST 93* 86* 55* 31  ALT 62* 66* 63* 52*  ALKPHOS 66 70 68 68  BILITOT 1.7* 1.7* 1.0 0.7  PROT 7.8 7.9 7.5 7.2  ALBUMIN 3.4* 3.5 3.2* 3.1*   No results for input(s): LIPASE, AMYLASE in the last 168 hours. No results for input(s): AMMONIA in the last 168 hours. Coagulation Profile: No results for input(s): INR, PROTIME in the last 168 hours. Cardiac Enzymes: No results for input(s): CKTOTAL, CKMB, CKMBINDEX, TROPONINI in the last 168 hours. BNP (last 3 results) No results for input(s):  PROBNP in the last 8760 hours. HbA1C: Recent Labs    08/17/19 1610  HGBA1C 8.2*   CBG: Recent Labs  Lab 08/19/19 1635 08/19/19 2020 08/19/19 2209 08/20/19 0715 08/20/19 1121  GLUCAP 368* 386* 307* 345* 452*   Lipid Profile: Recent Labs    08/17/19 1552  TRIG 108   Thyroid Function Tests: No results for input(s): TSH, T4TOTAL, FREET4, T3FREE, THYROIDAB in the last 72 hours. Anemia Panel: Recent Labs    08/19/19 0004 08/20/19 0318  FERRITIN 1,348*  1,434* 1,013*   Sepsis Labs: Recent Labs  Lab 08/17/19 1552 08/17/19 1610  PROCALCITON  --  <0.10  LATICACIDVEN 1.4  --     Recent Results (from the past 240 hour(s))  Blood Culture (routine x 2)     Status: None (Preliminary result)   Collection Time: 08/17/19  3:52 PM   Specimen: BLOOD LEFT FOREARM  Result Value Ref Range Status   Specimen Description   Final    BLOOD LEFT FOREARM Performed at [facility], [address], [place], [place] [address]    Special Requests   Final    BOTTLES DRAWN AEROBIC AND ANAEROBIC Blood Culture adequate volume Performed at [facility], [address] [place], [place] [address]    Culture   Final    NO GROWTH 3 DAYS Performed at [facility], [address], [place], [place] [address]    Report Status PENDING  Incomplete  Blood Culture (routine x 2)     Status: None (Preliminary result)   Collection Time: 08/17/19  3:52 PM   Specimen: BLOOD  Result Value Ref Range Status   Specimen Description   Final    BLOOD RIGHT ANTECUBITAL Performed at [facility], [address], [place], [place] [address]    Special Requests   Final    BOTTLES DRAWN AEROBIC AND ANAEROBIC Blood Culture adequate volume Performed at [facility], [address], [place], [place] [address]    Culture   Final    NO GROWTH 3 DAYS Performed at [facility], [address], [place], [place] [address]    Report Status  PENDING  Incomplete  Respiratory Panel by RT PCR (Flu A&B, Covid) - Nasopharyngeal Swab     Status: Abnormal   Collection Time: 08/17/19  6:19 PM   Specimen: Nasopharyngeal Swab  Result Value Ref Range Status   SARS Coronavirus 2 by RT PCR POSITIVE (A) NEGATIVE Final    Comment: RESULT CALLED TO, READ BACK BY AND VERIFIED WITH: B.BROOKS AT 1936 ON 08/17/19 BY N.THOMPSON (NOTE) SARS-CoV-2 target nucleic acids are DETECTED. SARS-CoV-2 RNA is generally detectable in upper respiratory specimens  during the acute phase of infection. Positive results are indicative of the presence of the identified virus, but do not rule out bacterial infection or co-infection with other pathogens not detected by the test. Clinical correlation with patient history and other diagnostic information is necessary to determine patient infection status. The expected result is Negative. Fact Sheet for Patients:  https://www.moore.com/ Fact Sheet for Healthcare Providers: https://www.young.biz/ This test is not yet approved or cleared by the [place] FDA and  has been authorized for detection and/or diagnosis of SARS-CoV-2 by FDA under an Emergency Use Authorization (EUA).  This EUA will remain in effect (meaning this test can be u sed) for the duration of  the COVID-19 declaration under Section 564(b)(1) of the Act, 21 U.S.C. section 360bbb-3(b)(1), unless the authorization is terminated or revoked sooner.    Influenza A by PCR NEGATIVE NEGATIVE Final   Influenza B by PCR NEGATIVE NEGATIVE Final    Comment: (NOTE) The Xpert Xpress SARS-CoV-2/FLU/RSV assay is intended as an aid in  the diagnosis of influenza from Nasopharyngeal swab specimens and  should not be used as a sole basis for treatment. Nasal washings and  aspirates are unacceptable for Xpert Xpress SARS-CoV-2/FLU/RSV  testing. Fact Sheet for Patients: https://www.moore.com/ Fact Sheet  for Healthcare Providers: https://www.young.biz/ This test is not yet approved or cleared by the [place] FDA and  has been authorized for detection and/or diagnosis of SARS-CoV-2 by  FDA under an Emergency Use Authorization (EUA). This EUA will remain  in effect (meaning this test can be used) for the duration of the  Covid-19 declaration under Section 564(b)(1) of the Act, 21  U.S.C. section 360bbb-3(b)(1), unless the authorization is  terminated or revoked. Performed at [facility], [address] [place], [place] [address]   Urine culture  Status: Abnormal   Collection Time: 08/18/19  4:30 AM   Specimen: Urine, Random  Result Value Ref Range Status   Specimen Description   Final    URINE, RANDOM Performed at [place] [address], [place], [place] [address]    Special Requests   Final    NONE Performed at [facility], [place] [address] [place], [place] [address]    Culture MULTIPLE SPECIES PRESENT, SUGGEST RECOLLECTION (A)  Final   Report Status 08/19/2019 FINAL  Final         Radiology Studies: DG CHEST PORT 1 VIEW  Result Date: 08/18/2019 CLINICAL DATA:  Pneumonia, increasing oxygen requirement EXAM: PORTABLE CHEST 1 VIEW COMPARISON:  Radiograph 08/17/2019 FINDINGS: Increasing peripheral and basilar airspace opacities with diminishing lung volumes. Cardiomegaly is similar to prior. No pneumothorax or effusion. No acute osseous or soft tissue abnormality. IMPRESSION: 1. Increasing peripheral and basilar airspace opacities with diminishing lung volumes. 2. Stable cardiomegaly without overt edema or effusion. Electronically Signed   By: Lovena Le M.D.   On: 08/18/2019 19:01        Scheduled Meds: . amLODipine  10 mg Oral Daily  . [START ON 08/21/2019] dexamethasone  6 mg Oral Daily  . enoxaparin (LOVENOX) injection  65 mg Subcutaneous Q24H  . feeding supplement (PRO-STAT  SUGAR FREE 64)  30 mL Oral BID  . hydrochlorothiazide  25 mg Oral Daily  . insulin aspart  0-20 Units Subcutaneous TID WC  . insulin aspart  8 Units Subcutaneous TID WC  . insulin detemir  25 Units Subcutaneous BID  . multivitamin with minerals  1 tablet Oral Daily  . vitamin C  500 mg Oral BID  . zinc sulfate  220 mg Oral Daily   Continuous Infusions: . remdesivir 100 mg in NS 100 mL Stopped (08/20/19 0852)     LOS: 3 days    Time spent: 30 minutes    Barb Merino, MD [facility] Pager [phone]

## 2019-08-20 NOTE — Progress Notes (Signed)
Patient able to self inject insulin. Patient states she is "not going to follow through" with insulin injections is dc'ed with subQ injection. Patient educated on how elevated BG and how it affects the body. Patient states she wants to be placed on PO medication. MD made aware.

## 2019-08-20 NOTE — Plan of Care (Signed)
  Problem: Education: Goal: Knowledge of risk factors and measures for prevention of condition will improve Outcome: Progressing   Problem: Coping: Goal: Psychosocial and spiritual needs will be supported Outcome: Progressing   Problem: Respiratory: Goal: Will maintain a patent airway Outcome: Progressing Goal: Complications related to the disease process, condition or treatment will be avoided or minimized Outcome: Progressing   

## 2019-08-20 NOTE — Progress Notes (Signed)
Ok to substitute Excedrin for Fioricet per Dr. Vanita Ingles.  Onnie Boer, PharmD, BCIDP, AAHIVP, CPP Infectious Disease Pharmacist 08/20/2019 8:51 PM

## 2019-08-20 NOTE — Progress Notes (Signed)
Inpatient Diabetes Program Recommendations  AACE/ADA: New Consensus Statement on Inpatient Glycemic Control (2015)  Target Ranges:  Prepandial:   less than 140 mg/dL      Peak postprandial:   less than 180 mg/dL (1-2 hours)      Critically ill patients:  140 - 180 mg/dL   Lab Results  Component Value Date   GLUCAP 345 (H) 08/20/2019   HGBA1C 8.2 (H) 08/17/2019  Results for SHELLEE, STRENG (MRN [id number]) as of 08/20/2019 10:59  Ref. Range 08/19/2019 11:39 08/19/2019 16:35 08/19/2019 20:20 08/19/2019 22:09 08/20/2019 07:15  Glucose-Capillary Latest Ref Range: 70 - 99 mg/dL 369 (H) 368 (H) 386 (H) 307 (H) 345 (H)    Review of Glycemic Control  Diabetes history: type 2 Outpatient Diabetes medications: none listed Current orders for Inpatient glycemic control: Levemir 20 U BID, Novolog MODERATE correction scale TID, Novolog 8 U TID  Inpatient Diabetes Program Recommendations:   Noted that CBGs have been greater than 300 mg/dl.  Recommend increasing Levemir to 25 units BID, increase Novolog correction scale to RESISTANT TID, add NS scale,and continue Novolog 8 units TID.  Titrate dosages as needed.   Harvel Ricks RN BSN CDE Diabetes Coordinator Pager: [phone]  8am-5pm

## 2019-08-21 LAB — D-DIMER, QUANTITATIVE: D-Dimer, Quant: 0.52 ug/mL-FEU — ABNORMAL HIGH (ref 0.00–0.50)

## 2019-08-21 LAB — GLUCOSE, CAPILLARY
Glucose-Capillary: 196 mg/dL — ABNORMAL HIGH (ref 70–99)
Glucose-Capillary: 391 mg/dL — ABNORMAL HIGH (ref 70–99)
Glucose-Capillary: 355 mg/dL — ABNORMAL HIGH (ref 70–99)

## 2019-08-21 LAB — CBC WITH DIFFERENTIAL/PLATELET
Abs Immature Granulocytes: 0.84 10*3/uL — ABNORMAL HIGH (ref 0.00–0.07)
Basophils Absolute: 0.1 10*3/uL (ref 0.0–0.1)
Basophils Relative: 1 %
Eosinophils Absolute: 0 10*3/uL (ref 0.0–0.5)
Eosinophils Relative: 0 %
HCT: 37.9 % (ref 36.0–46.0)
Hemoglobin: 11.9 g/dL — ABNORMAL LOW (ref 12.0–15.0)
Immature Granulocytes: 6 %
Lymphocytes Relative: 22 %
Lymphs Abs: 2.9 10*3/uL (ref 0.7–4.0)
MCH: 26.7 pg (ref 26.0–34.0)
MCHC: 31.4 g/dL (ref 30.0–36.0)
MCV: 85 fL (ref 80.0–100.0)
Monocytes Absolute: 1 10*3/uL (ref 0.1–1.0)
Monocytes Relative: 7 %
Neutro Abs: 8.4 10*3/uL — ABNORMAL HIGH (ref 1.7–7.7)
Neutrophils Relative %: 64 %
Platelets: 433 10*3/uL — ABNORMAL HIGH (ref 150–400)
RBC: 4.46 MIL/uL (ref 3.87–5.11)
RDW: 11.9 % (ref 11.5–15.5)
WBC: 13.1 10*3/uL — ABNORMAL HIGH (ref 4.0–10.5)
nRBC: 0.4 % — ABNORMAL HIGH (ref 0.0–0.2)

## 2019-08-21 LAB — COMPREHENSIVE METABOLIC PANEL
ALT: 39 U/L (ref 0–44)
AST: 24 U/L (ref 15–41)
Albumin: 2.9 g/dL — ABNORMAL LOW (ref 3.5–5.0)
Alkaline Phosphatase: 64 U/L (ref 38–126)
Anion gap: 10 (ref 5–15)
BUN: 21 mg/dL — ABNORMAL HIGH (ref 6–20)
CO2: 33 mmol/L — ABNORMAL HIGH (ref 22–32)
Calcium: 8.6 mg/dL — ABNORMAL LOW (ref 8.9–10.3)
Chloride: 94 mmol/L — ABNORMAL LOW (ref 98–111)
Creatinine, Ser: 0.82 mg/dL (ref 0.44–1.00)
GFR calc Af Amer: >60 mL/min (ref >60)
GFR calc non Af Amer: >60 mL/min (ref >60)
Glucose, Bld: 264 mg/dL — ABNORMAL HIGH (ref 70–99)
Potassium: 3.8 mmol/L (ref 3.5–5.1)
Sodium: 137 mmol/L (ref 135–145)
Total Bilirubin: 0.6 mg/dL (ref 0.3–1.2)
Total Protein: 6.3 g/dL — ABNORMAL LOW (ref 6.5–8.1)

## 2019-08-21 LAB — MAGNESIUM: Magnesium: 2.2 mg/dL (ref 1.7–2.4)

## 2019-08-21 LAB — FERRITIN: Ferritin: 652 ng/mL — ABNORMAL HIGH (ref 11–307)

## 2019-08-21 LAB — LACTATE DEHYDROGENASE: LDH: 287 U/L — ABNORMAL HIGH (ref 98–192)

## 2019-08-21 LAB — PHOSPHORUS: Phosphorus: 4 mg/dL (ref 2.5–4.6)

## 2019-08-21 LAB — C-REACTIVE PROTEIN: CRP: 0.5 mg/dL (ref <1.0)

## 2019-08-21 MED ORDER — ONDANSETRON HCL 4 MG/2ML IJ SOLN
4.0000 mg | Freq: Four times a day (QID) | INTRAMUSCULAR | Status: DC | PRN
  Administered 2019-08-21: 4 mg via INTRAVENOUS
  Filled 2019-08-21: qty 2

## 2019-08-21 NOTE — Plan of Care (Signed)
  Problem: Education: Goal: Knowledge of risk factors and measures for prevention of condition will improve Outcome: Progressing   Problem: Coping: Goal: Psychosocial and spiritual needs will be supported Outcome: Progressing   Problem: Respiratory: Goal: Will maintain a patent airway Outcome: Progressing Goal: Complications related to the disease process, condition or treatment will be avoided or minimized Outcome: Progressing   

## 2019-08-21 NOTE — Progress Notes (Signed)
Offered to let patient practice injecting self with SQ insulin, but pt declined. Stated "I'd rather you just do it quickly". Pt states she is comfortable with injecting self at home and has no concerns about doing it. Gave pt her insulin injection.

## 2019-08-21 NOTE — Progress Notes (Signed)
PROGRESS NOTE    Valerie Moss  WUJ:[id number] DOB: Jul 24, 1976 DOA: 08/17/2019 PCP: Kallie LocksStroud, Natalie M, FNP    Brief Narrative:  Patient is a 43 year old female with history of hypertension, pre-diabetes, morbid obesity who presented to the emergency department with complaints of fever, shortness of breath, cough, loss of appetite and test for about 6 days duration.  For the last 2 days she was not feeling very well.  She had high-grade fever.  When found by EMS, she was saturating 70% on room air.  They put her on 6 L oxygen and brought to the ER. In the emergency room, patient was on 4 L of oxygen and saturating well.  Feeling extremely weak.  Chest x-ray showed patchy airspace infiltrates in lower lungs consistent with Covid pneumonia.  Inflammatory markers were elevated.  She was hyperglycemic.  Does have history of prediabetes.  Admitted to the hospital with COVID-19 pneumonia with hypoxia.   Assessment & Plan:   Principal Problem:   Pneumonia due to COVID-19 virus Active Problems:   COVID-19   Hypoxia   Type 2 diabetes mellitus without complication (HCC)  Pneumonia due to COVID-19 virus with hypoxia: Some clinical improvement today.  Continue with chest physiotherapy, incentive spirometry, deep breathing exercises, sputum induction, mucolytic's and bronchodilators. Supplemental oxygen to keep saturations more than 90%. Covid directed therapy with , steroids, dexamethasone.  Total 10 days of therapy. remdesivir, day 5/5. Actemra, single dose 08/18/2019 Vitamin C and zinc. antibiotics, not indicated. Mobility.  Out of bed.  Hypertension: Blood pressures stable on current regimen of amlodipine and hydrochlorothiazide.  Continue.  Type 2 diabetes, new onset with hyperglycemia: Hemoglobin A1c 8.2. Blood sugars uncontrolled.  More than 400.  Patient does have diabetes, now exacerbated with use of steroids. Very high blood sugars.  Difficult to control.  Currently on increasing  dose of insulin. She will need insulin regimen to go home.  To simplify regimen, she is agreeable to go home on twice a day long-acting insulin. Nursing to teach at bedside with insulin techniques and blood sugar monitoring for home.  Morbid obesity: BMI more than 40.  She will definitely benefit with weight loss and lifestyle modification.   DVT prophylaxis: Lovenox subcu. Code Status: Full code Family Communication: None.  Patient communicating with family. Disposition Plan: Home after clinical improvement.  Anticipate tomorrow. She can be transferred to MedSurg unit pending improvement for discharge.   Consultants:   None  Procedures:   None  Antimicrobials:   Remdesivir, 08/17/2019>>>   Subjective: Patient seen and examined.  No overnight events.  Still gets some lightheadedness on walking around.  Able to come off the oxygen at rest. Objective: Vitals:   08/21/19 0001 08/21/19 0507 08/21/19 0816 08/21/19 0950  BP: 126/70 (!) 120/55 117/68 119/62  Pulse: 68 84 75   Resp: (!) 21 20 (!) 24   Temp: 98.2 F (36.8 C) 98 F (36.7 C) 98.4 F (36.9 C)   TempSrc: Oral Oral Oral   SpO2: 94% 100% 92%   Weight:      Height:        Intake/Output Summary (Last 24 hours) at 08/21/2019 1435 Last data filed at 08/20/2019 1800 Gross per 24 hour  Intake 1484.05 ml  Output --  Net 1484.05 ml   Filed Weights   08/17/19 1540  Weight: 129.7 kg    Examination:  General exam: Appears calm and comfortable room air at rest.  Dizziness and tachycardia on ambulation. Respiratory system: Patient has some expiratory  crackles left base.  Fairly comfortable looking. Cardiovascular system: S1 & S2 heard, RRR. No JVD, murmurs, rubs, gallops or clicks. No pedal edema. Gastrointestinal system: Abdomen is nondistended, soft and nontender. No organomegaly or masses felt. Normal bowel sounds heard.  Obese and pendulous. Central nervous system: Alert and oriented. No focal neurological  deficits. Extremities: Symmetric 5 x 5 power. Skin: No rashes, lesions or ulcers Psychiatry: Judgement and insight appear normal. Mood & affect normal.    Data Reviewed: I have personally reviewed following labs and imaging studies  CBC: Recent Labs  Lab 08/17/19 1610 08/18/19 0507 08/19/19 0004 08/20/19 0318 08/21/19 0308  WBC 5.0 5.3 6.9 10.9* 13.1*  NEUTROABS 3.6 3.8 4.9 7.7 8.4*  HGB 13.1 12.3 12.2 12.4 11.9*  HCT 40.4 39.6 39.7 38.7 37.9  MCV 84.0 84.8 85.0 84.5 85.0  PLT 292 298 323 417* 782*   Basic Metabolic Panel: Recent Labs  Lab 08/18/19 0507 08/19/19 0004 08/20/19 0318 08/20/19 1432 08/21/19 0308  NA 136 134* 133* 130* 137  K 4.1 4.3 4.1 5.1 3.8  CL 97* 92* 88* 91* 94*  CO2 27 29 30 26  33*  GLUCOSE 321* 369* 355* 397* 264*  BUN 13 20 21* 23* 21*  CREATININE 0.82 0.85 0.78 0.97 0.82  CALCIUM 8.5* 9.0 8.7* 8.6* 8.6*  MG 2.2 2.3 2.3  --  2.2  PHOS 2.8 2.9 4.0  --  4.0   GFR: Estimated Creatinine Clearance: 125.3 mL/min (by C-G formula based on SCr of 0.82 mg/dL). Liver Function Tests: Recent Labs  Lab 08/17/19 1610 08/18/19 0507 08/19/19 0004 08/20/19 0318 08/21/19 0308  AST 93* 86* 55* 31 24  ALT 62* 66* 63* 52* 39  ALKPHOS 66 70 68 68 64  BILITOT 1.7* 1.7* 1.0 0.7 0.6  PROT 7.8 7.9 7.5 7.2 6.3*  ALBUMIN 3.4* 3.5 3.2* 3.1* 2.9*   No results for input(s): LIPASE, AMYLASE in the last 168 hours. No results for input(s): AMMONIA in the last 168 hours. Coagulation Profile: No results for input(s): INR, PROTIME in the last 168 hours. Cardiac Enzymes: No results for input(s): CKTOTAL, CKMB, CKMBINDEX, TROPONINI in the last 168 hours. BNP (last 3 results) No results for input(s): PROBNP in the last 8760 hours. HbA1C: No results for input(s): HGBA1C in the last 72 hours. CBG: Recent Labs  Lab 08/20/19 0715 08/20/19 1121 08/20/19 1614 08/20/19 2040 08/21/19 0730  GLUCAP 345* 452* 356* 355* 196*   Lipid Profile: No results for input(s):  CHOL, HDL, LDLCALC, TRIG, CHOLHDL, LDLDIRECT in the last 72 hours. Thyroid Function Tests: No results for input(s): TSH, T4TOTAL, FREET4, T3FREE, THYROIDAB in the last 72 hours. Anemia Panel: Recent Labs    08/20/19 0318 08/21/19 0308  FERRITIN 1,013* 652*   Sepsis Labs: Recent Labs  Lab 08/17/19 1552 08/17/19 1610  PROCALCITON  --  <0.10  LATICACIDVEN 1.4  --     Recent Results (from the past 240 hour(s))  Blood Culture (routine x 2)     Status: None (Preliminary result)   Collection Time: 08/17/19  3:52 PM   Specimen: BLOOD LEFT FOREARM  Result Value Ref Range Status   Specimen Description   Final    BLOOD LEFT FOREARM Performed at [facility] [address], [place], [place] [address]    Special Requests   Final    BOTTLES DRAWN AEROBIC AND ANAEROBIC Blood Culture adequate volume Performed at [facility] [address], [place], [place] [address]    Culture  Final    NO GROWTH 4 DAYS Performed at [facility], [address], [place], [place] [address]    Report Status PENDING  Incomplete  Blood Culture (routine x 2)     Status: None (Preliminary result)   Collection Time: 08/17/19  3:52 PM   Specimen: BLOOD  Result Value Ref Range Status   Specimen Description   Final    BLOOD RIGHT ANTECUBITAL Performed at [facility], [address] [place], [place] [address]    Special Requests   Final    BOTTLES DRAWN AEROBIC AND ANAEROBIC Blood Culture adequate volume Performed at [facility], [address], [place], [place] [address]    Culture   Final    NO GROWTH 4 DAYS Performed at [facility], [address], [place], [place] [address]    Report Status PENDING  Incomplete  Respiratory Panel by RT PCR (Flu A&B, Covid) - Nasopharyngeal Swab     Status: Abnormal   Collection Time: 08/17/19  6:19 PM   Specimen: Nasopharyngeal Swab  Result Value Ref Range Status   SARS Coronavirus 2  by RT PCR POSITIVE (A) NEGATIVE Final    Comment: RESULT CALLED TO, READ BACK BY AND VERIFIED WITH: B.BROOKS AT 1936 ON 08/17/19 BY N.THOMPSON (NOTE) SARS-CoV-2 target nucleic acids are DETECTED. SARS-CoV-2 RNA is generally detectable in upper respiratory specimens  during the acute phase of infection. Positive results are indicative of the presence of the identified virus, but do not rule out bacterial infection or co-infection with other pathogens not detected by the test. Clinical correlation with patient history and other diagnostic information is necessary to determine patient infection status. The expected result is Negative. Fact Sheet for Patients:  https://www.moore.com/ Fact Sheet for Healthcare Providers: https://www.young.biz/ This test is not yet approved or cleared by the [place] FDA and  has been authorized for detection and/or diagnosis of SARS-CoV-2 by FDA under an Emergency Use Authorization (EUA).  This EUA will remain in effect (meaning this test can be u sed) for the duration of  the COVID-19 declaration under Section 564(b)(1) of the Act, 21 U.S.C. section 360bbb-3(b)(1), unless the authorization is terminated or revoked sooner.    Influenza A by PCR NEGATIVE NEGATIVE Final   Influenza B by PCR NEGATIVE NEGATIVE Final    Comment: (NOTE) The Xpert Xpress SARS-CoV-2/FLU/RSV assay is intended as an aid in  the diagnosis of influenza from Nasopharyngeal swab specimens and  should not be used as a sole basis for treatment. Nasal washings and  aspirates are unacceptable for Xpert Xpress SARS-CoV-2/FLU/RSV  testing. Fact Sheet for Patients: https://www.moore.com/ Fact Sheet for Healthcare Providers: https://www.young.biz/ This test is not yet approved or cleared by the [place] FDA and  has been authorized for detection and/or diagnosis of SARS-CoV-2 by  FDA under an Emergency  Use Authorization (EUA). This EUA will remain  in effect (meaning this test can be used) for the duration of the  Covid-19 declaration under Section 564(b)(1) of the Act, 21  U.S.C. section 360bbb-3(b)(1), unless the authorization is  terminated or revoked. Performed at [facility], [address] [place], [place] [address]   Urine culture     Status: Abnormal   Collection Time: 08/18/19  4:30 AM   Specimen: Urine, Random  Result Value Ref Range Status   Specimen Description   Final    URINE, RANDOM Performed at [facility], [address] [place],  [place] [address]    Special Requests   Final    NONE Performed at [facility], [address] [place], [place] [address]    Culture MULTIPLE SPECIES PRESENT, SUGGEST RECOLLECTION (A)  Final   Report Status 08/19/2019 FINAL  Final         Radiology Studies: No results found.      Scheduled Meds: . amLODipine  10 mg Oral Daily  . dexamethasone  6 mg Oral Daily  . enoxaparin (LOVENOX) injection  65 mg Subcutaneous Q24H  . feeding supplement (PRO-STAT SUGAR FREE 64)  30 mL Oral BID  . hydrochlorothiazide  25 mg Oral Daily  . insulin aspart  0-20 Units Subcutaneous TID WC  . insulin aspart  8 Units Subcutaneous TID WC  . insulin detemir  25 Units Subcutaneous BID  . multivitamin with minerals  1 tablet Oral Daily  . vitamin C  500 mg Oral BID  . zinc sulfate  220 mg Oral Daily   Continuous Infusions:    LOS: 4 days    Time spent: 30 minutes    Dorcas Carrow, MD [facility] Pager [phone]

## 2019-08-22 LAB — COMPREHENSIVE METABOLIC PANEL
ALT: 43 U/L (ref 0–44)
AST: 27 U/L (ref 15–41)
Albumin: 3.7 g/dL (ref 3.5–5.0)
Alkaline Phosphatase: 67 U/L (ref 38–126)
Anion gap: 12 (ref 5–15)
BUN: 16 mg/dL (ref 6–20)
CO2: 33 mmol/L — ABNORMAL HIGH (ref 22–32)
Calcium: 8.9 mg/dL (ref 8.9–10.3)
Chloride: 91 mmol/L — ABNORMAL LOW (ref 98–111)
Creatinine, Ser: 0.74 mg/dL (ref 0.44–1.00)
GFR calc Af Amer: >60 mL/min (ref >60)
GFR calc non Af Amer: >60 mL/min (ref >60)
Glucose, Bld: 237 mg/dL — ABNORMAL HIGH (ref 70–99)
Potassium: 3.5 mmol/L (ref 3.5–5.1)
Sodium: 136 mmol/L (ref 135–145)
Total Bilirubin: 1 mg/dL (ref 0.3–1.2)
Total Protein: 7.6 g/dL (ref 6.5–8.1)

## 2019-08-22 LAB — MAGNESIUM: Magnesium: 2.3 mg/dL (ref 1.7–2.4)

## 2019-08-22 LAB — CBC WITH DIFFERENTIAL/PLATELET
Abs Immature Granulocytes: 0.9 10*3/uL — ABNORMAL HIGH (ref 0.00–0.07)
Band Neutrophils: 1 %
Basophils Absolute: 0 10*3/uL (ref 0.0–0.1)
Basophils Relative: 0 %
Eosinophils Absolute: 0 10*3/uL (ref 0.0–0.5)
Eosinophils Relative: 0 %
HCT: 43.2 % (ref 36.0–46.0)
Hemoglobin: 13.8 g/dL (ref 12.0–15.0)
Lymphocytes Relative: 28 %
Lymphs Abs: 4.1 10*3/uL — ABNORMAL HIGH (ref 0.7–4.0)
MCH: 26.6 pg (ref 26.0–34.0)
MCHC: 31.9 g/dL (ref 30.0–36.0)
MCV: 83.2 fL (ref 80.0–100.0)
Metamyelocytes Relative: 3 %
Monocytes Absolute: 0.7 10*3/uL (ref 0.1–1.0)
Monocytes Relative: 5 %
Myelocytes: 3 %
Neutro Abs: 9 10*3/uL — ABNORMAL HIGH (ref 1.7–7.7)
Neutrophils Relative %: 60 %
Platelets: 564 10*3/uL — ABNORMAL HIGH (ref 150–400)
RBC: 5.19 MIL/uL — ABNORMAL HIGH (ref 3.87–5.11)
RDW: 12 % (ref 11.5–15.5)
WBC: 14.7 10*3/uL — ABNORMAL HIGH (ref 4.0–10.5)
nRBC: 0.3 % — ABNORMAL HIGH (ref 0.0–0.2)

## 2019-08-22 LAB — FERRITIN: Ferritin: 565 ng/mL — ABNORMAL HIGH (ref 11–307)

## 2019-08-22 LAB — CULTURE, BLOOD (ROUTINE X 2)
Culture: NO GROWTH
Culture: NO GROWTH
Special Requests: ADEQUATE
Special Requests: ADEQUATE

## 2019-08-22 LAB — GLUCOSE, CAPILLARY
Glucose-Capillary: 212 mg/dL — ABNORMAL HIGH (ref 70–99)
Glucose-Capillary: 313 mg/dL — ABNORMAL HIGH (ref 70–99)

## 2019-08-22 LAB — LACTATE DEHYDROGENASE: LDH: 280 U/L — ABNORMAL HIGH (ref 98–192)

## 2019-08-22 LAB — PHOSPHORUS: Phosphorus: 3.9 mg/dL (ref 2.5–4.6)

## 2019-08-22 LAB — C-REACTIVE PROTEIN: CRP: 0.7 mg/dL (ref <1.0)

## 2019-08-22 LAB — D-DIMER, QUANTITATIVE: D-Dimer, Quant: 0.44 ug/mL-FEU (ref 0.00–0.50)

## 2019-08-22 MED ORDER — PEN NEEDLES 30G X 5 MM MISC: 1.0000 | Freq: Two times a day (BID) | 0 refills | Status: DC

## 2019-08-22 MED ORDER — DEXAMETHASONE 4 MG PO TABS: 4.0000 mg | ORAL_TABLET | Freq: Every day | ORAL | 0 refills | Status: AC

## 2019-08-22 MED ORDER — "BD DISP NEEDLES 30G X 1/2"" MISC": 1.0000 | Freq: Two times a day (BID) | 0 refills | Status: DC

## 2019-08-22 MED ORDER — LEVEMIR FLEXTOUCH 100 UNIT/ML ~~LOC~~ SOPN: 25.0000 [IU] | PEN_INJECTOR | Freq: Two times a day (BID) | SUBCUTANEOUS | 11 refills | Status: DC

## 2019-08-22 MED ORDER — METFORMIN HCL 500 MG PO TABS: 500.0000 mg | ORAL_TABLET | Freq: Two times a day (BID) | ORAL | 0 refills | Status: DC

## 2019-08-22 MED ORDER — NOVOLIN 70/30 (70-30) 100 UNIT/ML ~~LOC~~ SUSP: 25.0000 [IU] | Freq: Two times a day (BID) | SUBCUTANEOUS | 3 refills | Status: DC

## 2019-08-22 MED ORDER — INSULIN SYRINGES (DISPOSABLE) U-100 0.3 ML MISC: 1.0000 | Freq: Two times a day (BID) | 0 refills | Status: DC

## 2019-08-22 NOTE — Progress Notes (Signed)
Received notice from RN post discharge that pt needed assistance with medications- pt eligible for [facility], call made to [place] and [facility] letter to be faxed by unit secretary to [facility]- fax # to [facility] [phone].

## 2019-08-22 NOTE — Discharge Summary (Signed)
Physician Discharge Summary  Valerie Moss RFX:[id number] DOB: April 20, 1976 DOA: 08/17/2019  PCP: Azzie Glatter, FNP  Admit date: 08/17/2019 Discharge date: 08/22/2019  Admitted From: Home. Disposition: Home.  Recommendations for Outpatient Follow-up:  1. Follow up with PCP in 1-2 weeks 2. Diabetic diet.  Blood sugars at home and follow-up with primary care physician to adjust medications.  Home Health: Not applicable Equipment/Devices: Not applicable  Discharge Condition: Stable CODE STATUS: Full code Diet recommendation: Low-carb diet  Discharge summary: Patient is a 43 year old female with history of hypertension, pre-diabetes, morbid obesity who presented to the emergency department with complaints of fever, shortness of breath, cough, loss of appetite and taste for about 6 days duration.  For the last 2 days she was not feeling very well.  She had high-grade fever.  When found by EMS, she was saturating 70% on room air.  They put her on 6 L oxygen and brought to the ER. In the emergency room, patient was on 4 L of oxygen and saturating well.  Feeling extremely weak.  Chest x-ray showed patchy airspace infiltrates in lower lungs consistent with Covid pneumonia.  Inflammatory markers were elevated.  She was hyperglycemic.  Does have history of prediabetes.  Admitted to the hospital with COVID-19 pneumonia with hypoxia.  Pneumonia due to COVID-19 virus with hypoxia: Adequate clinical improvement.  Treated aggressively with IV steroids, remdesivir, Actemra and chest physiotherapy's.   Now ambulating within room air with no hypoxia. Because of greatly elevated blood sugars, will continue to taper down steroids doses and stop in 10 days.  4 more doses.  Hypertension: Blood pressures stable on current regimen of amlodipine and hydrochlorothiazide.  Continue.  Type 2 diabetes, new onset with hyperglycemia: Hemoglobin A1c 8.2. Blood sugars uncontrolled.  More than 400.  Patient does  have diabetes, now exacerbated with use of steroids. Very high blood sugars.  Her blood sugars are as high as 500 in the hospital.  She was started on insulin. Since patient is still going home on steroids, a detailed diabetic education was done.  Patient is already checking her blood sugars at home. To simplify her regimen, she will go home with Levemir 25 units twice a day, Metformin 500 mg twice a day, she will monitor sugars at home and follow-up with her primary care physician. Discussed about diet exercise and lifestyle modifications.  Adequately improved.  Able to go home today. Discharge Diagnoses:  Principal Problem:   Pneumonia due to COVID-19 virus Active Problems:   COVID-19   Hypoxia   Type 2 diabetes mellitus without complication [facility])    Discharge Instructions  Discharge Instructions    Call MD for:  difficulty breathing, headache or visual disturbances   Complete by: As directed    Call MD for:  persistant nausea and vomiting   Complete by: As directed    Call MD for:  temperature >100.4   Complete by: As directed    Diet Carb Modified   Complete by: As directed    Discharge instructions   Complete by: As directed    Check your blood sugars 2 times a day at home and keep a logbook and bring it to doctor's appointment. Familiarize yourself with symptoms of low blood sugars and how to treat it. Can take over-the-counter Tylenol and cough medications.   Increase activity slowly   Complete by: As directed      Allergies as of 08/22/2019   No Known Allergies     Medication List  STOP taking these medications   Vitamin D (Ergocalciferol) 1.25 MG (50000 UT) Caps capsule Commonly known as: DRISDOL     TAKE these medications   acetaminophen 500 MG tablet Commonly known as: TYLENOL Take 1,000 mg by mouth every 6 (six) hours as needed for mild pain.   albuterol 108 (90 Base) MCG/ACT inhaler Commonly known as: VENTOLIN HFA Inhale 2 puffs into the lungs  every 6 (six) hours as needed for wheezing or shortness of breath.   amLODipine 10 MG tablet Commonly known as: NORVASC Take 1 tablet (10 mg total) by mouth daily.   benzonatate 100 MG capsule Commonly known as: TESSALON Take 100 mg by mouth 3 (three) times daily as needed for cough.   blood glucose meter kit and supplies Dispense based on patient and insurance preference. Use up to four times daily as directed. (FOR ICD-10 E10.9, E11.9).   dexamethasone 4 MG tablet Commonly known as: DECADRON Take 1 tablet (4 mg total) by mouth daily for 4 days. Start taking on: August 23, 2019   hydrochlorothiazide 25 MG tablet Commonly known as: HYDRODIURIL Take 1 tablet (25 mg total) by mouth daily.   Levemir FlexTouch 100 UNIT/ML Pen Generic drug: Insulin Detemir Inject 25 Units into the skin 2 (two) times daily.   metFORMIN 500 MG tablet Commonly known as: Glucophage Take 1 tablet (500 mg total) by mouth 2 (two) times daily with a meal.   Pen Needles 30G X 5 MM Misc 1 each by Does not apply route 2 (two) times daily.       No Known Allergies  Consultations:  None   Procedures/Studies: DG CHEST PORT 1 VIEW  Result Date: 08/18/2019 CLINICAL DATA:  Pneumonia, increasing oxygen requirement EXAM: PORTABLE CHEST 1 VIEW COMPARISON:  Radiograph 08/17/2019 FINDINGS: Increasing peripheral and basilar airspace opacities with diminishing lung volumes. Cardiomegaly is similar to prior. No pneumothorax or effusion. No acute osseous or soft tissue abnormality. IMPRESSION: 1. Increasing peripheral and basilar airspace opacities with diminishing lung volumes. 2. Stable cardiomegaly without overt edema or effusion. Electronically Signed   By: Lovena Le M.D.   On: 08/18/2019 19:01   DG Chest Port 1 View  Result Date: 08/17/2019 CLINICAL DATA:  Fever 101.9 degrees, diarrhea, tested positive for COVID-19 on 08/15/2019 EXAM: PORTABLE CHEST 1 VIEW COMPARISON:  Portable exam 1629 hours compared  to 12/31/2018 FINDINGS: Borderline enlargement of cardiac silhouette. Mediastinal contours normal. Patchy airspace infiltrates mid to lower LEFT lung and at RIGHT base consistent with multifocal pneumonia and history of COVID-19. No pleural effusion or pneumothorax. Bones unremarkable. IMPRESSION: Patchy airspace infiltrates in the lower lungs consistent with COVID pneumonia. Electronically Signed   By: Lavonia Dana M.D.   On: 08/17/2019 16:52       Subjective: Patient seen and examined.  On room air.  Walked around in the hallway with some fatigue, however no shortness of breath or hypoxia.  Remains afebrile. Eager to go home.  Motivated to keep her sugar under control.   Discharge Exam: Vitals:   08/22/19 0810 08/22/19 1144  BP: (!) 144/67 (!) 115/95  Pulse: 80 95  Resp: 18 16  Temp: 97.8 F (36.6 C) 97.8 F (36.6 C)  SpO2: 93% 95%   Vitals:   08/21/19 1930 08/22/19 0400 08/22/19 0810 08/22/19 1144  BP: 126/79 139/90 (!) 144/67 (!) 115/95  Pulse: 79 80 80 95  Resp: _0 Temp: 98.5 F (36.9 C) 98.3 F (36.8 C) 97.8 F (36.6 C) 97.8  F (36.6 C)  TempSrc: Oral Oral Oral Oral  SpO2: 93% 93% 93% 95%  Weight:      Height:        General: Pt is alert, awake, not in acute distress Cardiovascular: RRR, S1/S2 +, no rubs, no gallops Respiratory: CTA bilaterally, no wheezing, no rhonchi Abdominal: Soft, NT, ND, bowel sounds + Extremities: no edema, no cyanosis    The results of significant diagnostics from this hospitalization (including imaging, microbiology, ancillary and laboratory) are listed below for reference.     Microbiology: Recent Results (from the past 240 hour(s))  Blood Culture (routine x 2)     Status: None   Collection Time: 08/17/19  3:52 PM   Specimen: BLOOD LEFT FOREARM  Result Value Ref Range Status   Specimen Description   Final    BLOOD LEFT FOREARM Performed at [facility] [address] [place], [place] [address]     Special Requests   Final    BOTTLES DRAWN AEROBIC AND ANAEROBIC Blood Culture adequate volume Performed at [facility] [address], [place], [place] [address]    Culture   Final    NO GROWTH 5 DAYS Performed at [facility], [place] [address] [place], [place] [address]    Report Status 08/22/2019 FINAL  Final  Blood Culture (routine x 2)     Status: None   Collection Time: 08/17/19  3:52 PM   Specimen: BLOOD  Result Value Ref Range Status   Specimen Description   Final    BLOOD RIGHT ANTECUBITAL Performed at [facility], [place] [address], [place], [place] [address]    Special Requests   Final    BOTTLES DRAWN AEROBIC AND ANAEROBIC Blood Culture adequate volume Performed at [facility] [address] [place], [place] [address]    Culture   Final    NO GROWTH 5 DAYS Performed at [facility], [place] [address], [place], [place] [address]    Report Status 08/22/2019 FINAL  Final  Respiratory Panel by RT PCR (Flu A&B, Covid) - Nasopharyngeal Swab     Status: Abnormal   Collection Time: 08/17/19  6:19 PM   Specimen: Nasopharyngeal Swab  Result Value Ref Range Status   SARS Coronavirus 2 by RT PCR POSITIVE (A) NEGATIVE Final    Comment: RESULT CALLED TO, READ BACK BY AND VERIFIED WITH: B.BROOKS AT 1936 ON 08/17/19 BY N.THOMPSON (NOTE) SARS-CoV-2 target nucleic acids are DETECTED. SARS-CoV-2 RNA is generally detectable in upper respiratory specimens  during the acute phase of infection. Positive results are indicative of the presence of the identified virus, but do not rule out bacterial infection or co-infection with other pathogens not detected by the test. Clinical correlation with patient history and other diagnostic information is necessary to determine patient infection status. The expected result is Negative. Fact Sheet for Patients:  PinkCheek.be Fact Sheet for Healthcare  Providers: GravelBags.it This test is not yet approved or cleared by the [place] FDA and  has been authorized for detection and/or diagnosis of SARS-CoV-2 by FDA under an Emergency Use Authorization (EUA).  This EUA will remain in effect (meaning this test can be u sed) for the duration of  the COVID-19 declaration under Section 564(b)(1) of the Act, 21 U.S.C. section 360bbb-3(b)(1), unless the authorization is terminated or revoked sooner.    Influenza A by PCR NEGATIVE NEGATIVE Final   Influenza B by PCR NEGATIVE NEGATIVE Final  Comment: (NOTE) The Xpert Xpress SARS-CoV-2/FLU/RSV assay is intended as an aid in  the diagnosis of influenza from Nasopharyngeal swab specimens and  should not be used as a sole basis for treatment. Nasal washings and  aspirates are unacceptable for Xpert Xpress SARS-CoV-2/FLU/RSV  testing. Fact Sheet for Patients: PinkCheek.be Fact Sheet for Healthcare Providers: GravelBags.it This test is not yet approved or cleared by the [place] FDA and  has been authorized for detection and/or diagnosis of SARS-CoV-2 by  FDA under an Emergency Use Authorization (EUA). This EUA will remain  in effect (meaning this test can be used) for the duration of the  Covid-19 declaration under Section 564(b)(1) of the Act, 21  U.S.C. section 360bbb-3(b)(1), unless the authorization is  terminated or revoked. Performed at [facility], [place] [address], [place], [place] [address]   Urine culture     Status: Abnormal   Collection Time: 08/18/19  4:30 AM   Specimen: Urine, Random  Result Value Ref Range Status   Specimen Description   Final    URINE, RANDOM Performed at [facility] [address] [place], [place] [address]    Special Requests   Final    NONE Performed at [facility], [place] [address] [place],   [address]    Culture MULTIPLE SPECIES PRESENT, SUGGEST RECOLLECTION (A)  Final   Report Status 08/19/2019 FINAL  Final     Labs: BNP (last 3 results) No results for input(s): BNP in the last 8760 hours. Basic Metabolic Panel: Recent Labs  Lab 08/18/19 0507 08/19/19 0004 08/20/19 0318 08/20/19 1432 08/21/19 0308 08/22/19 0723  NA 136 134* 133* 130* 137 136  K 4.1 4.3 4.1 5.1 3.8 3.5  CL 97* 92* 88* 91* 94* 91*  CO2 _0 33* 33*  GLUCOSE 321* 369* 355* 397* 264* 237*  BUN 13 20 21* 23* 21* 16  CREATININE 0.82 0.85 0.78 0.97 0.82 0.74  CALCIUM 8.5* 9.0 8.7* 8.6* 8.6* 8.9  MG 2.2 2.3 2.3  --  2.2 2.3  PHOS 2.8 2.9 4.0  --  4.0 3.9   Liver Function Tests: Recent Labs  Lab 08/18/19 0507 08/19/19 0004 08/20/19 0318 08/21/19 0308 08/22/19 0723  AST 86* 55* _1 ALT 66* 63* 52* 39 43  ALKPHOS 70 68 68 64 67  BILITOT 1.7* 1.0 0.7 0.6 1.0  PROT 7.9 7.5 7.2 6.3* 7.6  ALBUMIN 3.5 3.2* 3.1* 2.9* 3.7   No results for input(s): LIPASE, AMYLASE in the last 168 hours. No results for input(s): AMMONIA in the last 168 hours. CBC: Recent Labs  Lab 08/18/19 0507 08/19/19 0004 08/20/19 0318 08/21/19 0308 08/22/19 0723  WBC 5.3 6.9 10.9* 13.1* 14.7*  NEUTROABS 3.8 4.9 7.7 8.4* 9.0*  HGB 12.3 12.2 12.4 11.9* 13.8  HCT 39.6 39.7 38.7 37.9 43.2  MCV 84.8 85.0 84.5 85.0 83.2  PLT 298 323 417* 433* 564*   Cardiac Enzymes: No results for input(s): CKTOTAL, CKMB, CKMBINDEX, TROPONINI in the last 168 hours. BNP: Invalid input(s): POCBNP CBG: Recent Labs  Lab 08/20/19 2040 08/21/19 0730 08/21/19 1721 08/21/19 1926 08/22/19 0722  GLUCAP 355* 196* 391* 355* 212*   D-Dimer Recent Labs    08/21/19 0308 08/22/19 0723  DDIMER 0.52* 0.44   Hgb A1c No results for input(s): HGBA1C in the last 72 hours. Lipid Profile No results for input(s): CHOL, HDL, LDLCALC, TRIG, CHOLHDL, LDLDIRECT in the last 72 hours. Thyroid function studies  No results for input(s): TSH,  T4TOTAL, T3FREE, THYROIDAB in the last 72 hours.  Invalid input(s): FREET3 Anemia work up Recent Labs    08/21/19 0308 08/22/19 0723  FERRITIN 652* 565*   Urinalysis    Component Value Date/Time   COLORURINE AMBER (A) 08/18/2019 0430   APPEARANCEUR HAZY (A) 08/18/2019 0430   LABSPEC 1.027 08/18/2019 0430   PHURINE 5.0 08/18/2019 0430   GLUCOSEU >=500 (A) 08/18/2019 0430   HGBUR NEGATIVE 08/18/2019 0430   BILIRUBINUR NEGATIVE 08/18/2019 0430   BILIRUBINUR negative 01/13/2019 0827   KETONESUR 5 (A) 08/18/2019 0430   PROTEINUR 30 (A) 08/18/2019 0430   UROBILINOGEN 0.2 01/13/2019 0827   NITRITE NEGATIVE 08/18/2019 0430   LEUKOCYTESUR NEGATIVE 08/18/2019 0430   Sepsis Labs Invalid input(s): PROCALCITONIN,  WBC,  LACTICIDVEN Microbiology Recent Results (from the past 240 hour(s))  Blood Culture (routine x 2)     Status: None   Collection Time: 08/17/19  3:52 PM   Specimen: BLOOD LEFT FOREARM  Result Value Ref Range Status   Specimen Description   Final    BLOOD LEFT FOREARM Performed at [facility], [place] [address], [place], [place] [address]    Special Requests   Final    BOTTLES DRAWN AEROBIC AND ANAEROBIC Blood Culture adequate volume Performed at [facility] [address], [place], [place] [address]    Culture   Final    NO GROWTH 5 DAYS Performed at [facility], [place] [address] [place], [place] [address]    Report Status 08/22/2019 FINAL  Final  Blood Culture (routine x 2)     Status: None   Collection Time: 08/17/19  3:52 PM   Specimen: BLOOD  Result Value Ref Range Status   Specimen Description   Final    BLOOD RIGHT ANTECUBITAL Performed at [facility], [place] [address] [place], [place] [address]    Special Requests   Final    BOTTLES DRAWN AEROBIC AND ANAEROBIC Blood Culture adequate volume Performed at [facility] [address] [place], [place] [address]    Culture   Final     NO GROWTH 5 DAYS Performed at [facility], [place] [address], [place], [place] [address]    Report Status 08/22/2019 FINAL  Final  Respiratory Panel by RT PCR (Flu A&B, Covid) - Nasopharyngeal Swab     Status: Abnormal   Collection Time: 08/17/19  6:19 PM   Specimen: Nasopharyngeal Swab  Result Value Ref Range Status   SARS Coronavirus 2 by RT PCR POSITIVE (A) NEGATIVE Final    Comment: RESULT CALLED TO, READ BACK BY AND VERIFIED WITH: B.BROOKS AT 1936 ON 08/17/19 BY N.THOMPSON (NOTE) SARS-CoV-2 target nucleic acids are DETECTED. SARS-CoV-2 RNA is generally detectable in upper respiratory specimens  during the acute phase of infection. Positive results are indicative of the presence of the identified virus, but do not rule out bacterial infection or co-infection with other pathogens not detected by the test. Clinical correlation with patient history and other diagnostic information is necessary to determine patient infection status. The expected result is Negative. Fact Sheet for Patients:  PinkCheek.be Fact Sheet for Healthcare Providers: GravelBags.it This test is not yet approved or cleared by the [place] FDA and  has been authorized for detection and/or diagnosis of SARS-CoV-2 by FDA under an Emergency Use Authorization (EUA).  This EUA will remain in effect (meaning this test can be u sed) for the duration  of  the COVID-19 declaration under Section 564(b)(1) of the Act, 21 U.S.C. section 360bbb-3(b)(1), unless the authorization is terminated or revoked sooner.    Influenza A by PCR NEGATIVE NEGATIVE Final   Influenza B by PCR NEGATIVE NEGATIVE Final    Comment: (NOTE) The Xpert Xpress SARS-CoV-2/FLU/RSV assay is intended as an aid in  the diagnosis of influenza from Nasopharyngeal swab specimens and  should not be used as a sole basis for treatment. Nasal washings and  aspirates are unacceptable for Xpert  Xpress SARS-CoV-2/FLU/RSV  testing. Fact Sheet for Patients: PinkCheek.be Fact Sheet for Healthcare Providers: GravelBags.it This test is not yet approved or cleared by the [place] FDA and  has been authorized for detection and/or diagnosis of SARS-CoV-2 by  FDA under an Emergency Use Authorization (EUA). This EUA will remain  in effect (meaning this test can be used) for the duration of the  Covid-19 declaration under Section 564(b)(1) of the Act, 21  U.S.C. section 360bbb-3(b)(1), unless the authorization is  terminated or revoked. Performed at [facility], [place] [address], [place], [place] [address]   Urine culture     Status: Abnormal   Collection Time: 08/18/19  4:30 AM   Specimen: Urine, Random  Result Value Ref Range Status   Specimen Description   Final    URINE, RANDOM Performed at [facility] [address], [place], [place] [address]    Special Requests   Final    NONE Performed at [facility], [place] [address], [place],  [address]    Culture MULTIPLE SPECIES PRESENT, SUGGEST RECOLLECTION (A)  Final   Report Status 08/19/2019 FINAL  Final     Time coordinating discharge:  45 minutes  SIGNED:   Barb Merino, MD  [facility] 08/22/2019, 11:46 AM

## 2019-08-22 NOTE — Discharge Instructions (Signed)
Person Under Monitoring Name: Valerie RedRachel Moss  Location: [address] [place] [id number]   Infection Prevention Recommendations for Individuals Confirmed to have, or Being Evaluated for, 2019 Novel Coronavirus (COVID-19) Infection Who Receive Care at Home  Individuals who are confirmed to have, or are being evaluated for, COVID-19 should follow the prevention steps below until a healthcare provider or local or state health department says they can return to normal activities.  Stay home except to get medical care You should restrict activities outside your home, except for getting medical care. Do not go to work, school, or public areas, and do not use public transportation or taxis.  Call ahead before visiting your doctor Before your medical appointment, call the healthcare provider and tell them that you have, or are being evaluated for, COVID-19 infection. This will help the healthcare providers office take steps to keep other people from getting infected. Ask your healthcare provider to call the local or state health department.  Monitor your symptoms Seek prompt medical attention if your illness is worsening (e.g., difficulty breathing). Before going to your medical appointment, call the healthcare provider and tell them that you have, or are being evaluated for, COVID-19 infection. Ask your healthcare provider to call the local or state health department.  Wear a facemask You should wear a facemask that covers your nose and mouth when you are in the same room with other people and when you visit a healthcare provider. People who live with or visit you should also wear a facemask while they are in the same room with you.  Separate yourself from other people in your home As much as possible, you should stay in a different room from other people in your home. Also, you should use a separate bathroom, if available.  Avoid sharing household items You should not share  dishes, drinking glasses, cups, eating utensils, towels, bedding, or other items with other people in your home. After using these items, you should wash them thoroughly with soap and water.  Cover your coughs and sneezes Cover your mouth and nose with a tissue when you cough or sneeze, or you can cough or sneeze into your sleeve. Throw used tissues in a lined trash can, and immediately wash your hands with soap and water for at least 20 seconds or use an alcohol-based hand rub.  Wash your [employer] Wash your hands often and thoroughly with soap and water for at least 20 seconds. You can use an alcohol-based hand sanitizer if soap and water are not available and if your hands are not visibly dirty. Avoid touching your eyes, nose, and mouth with unwashed hands.   Prevention Steps for Caregivers and Household Members of Individuals Confirmed to have, or Being Evaluated for, COVID-19 Infection Being Cared for in the Home  If you live with, or provide care at home for, a person confirmed to have, or being evaluated for, COVID-19 infection please follow these guidelines to prevent infection:  Follow healthcare providers instructions Make sure that you understand and can help the patient follow any healthcare provider instructions for all care.  Provide for the patients basic needs You should help the patient with basic needs in the home and provide support for getting groceries, prescriptions, and other personal needs.  Monitor the patients symptoms If they are getting sicker, call his or her medical provider and tell them that the patient has, or is being evaluated for, COVID-19 infection. This will help the healthcare providers office take  steps to keep other people from getting infected. Ask the healthcare provider to call the local or state health department.  Limit the number of people who have contact with the patient  If possible, have only one caregiver for the patient.  Other  household members should stay in another home or place of residence. If this is not possible, they should stay  in another room, or be separated from the patient as much as possible. Use a separate bathroom, if available.  Restrict visitors who do not have an essential need to be in the home.  Keep older adults, very young children, and other sick people away from the patient Keep older adults, very young children, and those who have compromised immune systems or chronic health conditions away from the patient. This includes people with chronic heart, lung, or kidney conditions, diabetes, and cancer.  Ensure good ventilation Make sure that shared spaces in the home have good air flow, such as from an air conditioner or an opened window, weather permitting.  Wash your hands often  Wash your hands often and thoroughly with soap and water for at least 20 seconds. You can use an alcohol based hand sanitizer if soap and water are not available and if your hands are not visibly dirty.  Avoid touching your eyes, nose, and mouth with unwashed hands.  Use disposable paper towels to dry your hands. If not available, use dedicated cloth towels and replace them when they become wet.  Wear a facemask and gloves  Wear a disposable facemask at all times in the room and gloves when you touch or have contact with the patients blood, body fluids, and/or secretions or excretions, such as sweat, saliva, sputum, nasal mucus, vomit, urine, or feces.  Ensure the mask fits over your nose and mouth tightly, and do not touch it during use.  Throw out disposable facemasks and gloves after using them. Do not reuse.  Wash your hands immediately after removing your facemask and gloves.  If your personal clothing becomes contaminated, carefully remove clothing and launder. Wash your hands after handling contaminated clothing.  Place all used disposable facemasks, gloves, and other waste in a lined container before  disposing them with other household waste.  Remove gloves and wash your hands immediately after handling these items.  Do not share dishes, glasses, or other household items with the patient  Avoid sharing household items. You should not share dishes, drinking glasses, cups, eating utensils, towels, bedding, or other items with a patient who is confirmed to have, or being evaluated for, COVID-19 infection.  After the person uses these items, you should wash them thoroughly with soap and water.  Wash laundry thoroughly  Immediately remove and wash clothes or bedding that have blood, body fluids, and/or secretions or excretions, such as sweat, saliva, sputum, nasal mucus, vomit, urine, or feces, on them.  Wear gloves when handling laundry from the patient.  Read and follow directions on labels of laundry or clothing items and detergent. In general, wash and dry with the warmest temperatures recommended on the label.  Clean all areas the individual has used often  Clean all touchable surfaces, such as counters, tabletops, doorknobs, bathroom fixtures, toilets, phones, keyboards, tablets, and bedside tables, every day. Also, clean any surfaces that may have blood, body fluids, and/or secretions or excretions on them.  Wear gloves when cleaning surfaces the patient has come in contact with.  Use a diluted bleach solution (e.g., dilute bleach with 1 part bleach  and 10 parts water) or a household disinfectant with a label that says EPA-registered for coronaviruses. To make a bleach solution at home, add 1 tablespoon of bleach to 1 quart (4 cups) of water. For a larger supply, add  cup of bleach to 1 gallon (16 cups) of water.  Read labels of cleaning products and follow recommendations provided on product labels. Labels contain instructions for safe and effective use of the cleaning product including precautions you should take when applying the product, such as wearing gloves or eye protection  and making sure you have good ventilation during use of the product.  Remove gloves and wash hands immediately after cleaning.  Monitor yourself for signs and symptoms of illness Caregivers and household members are considered close contacts, should monitor their health, and will be asked to limit movement outside of the home to the extent possible. Follow the monitoring steps for close contacts listed on the symptom monitoring form.   ? If you have additional questions, contact your local health department or call the epidemiologist on call at [phone] (available 24/7). ? This guidance is subject to change. For the most up-to-date guidance from CDC, please refer to their website: http://www.martin.com/ COVID-19 is a respiratory infection that is caused by a virus called severe acute respiratory syndrome coronavirus 2 (SARS-CoV-2). The disease is also known as coronavirus disease or novel coronavirus. In some people, the virus may not cause any symptoms. In others, it may cause a serious infection. The infection can get worse quickly and can lead to complications, such as:  Pneumonia, or infection of the lungs.  Acute respiratory distress syndrome or ARDS. This is fluid build-up in the lungs.  Acute respiratory failure. This is a condition in which there is not enough oxygen passing from the lungs to the body.  Sepsis or septic shock. This is a serious bodily reaction to an infection.  Blood clotting problems.  Secondary infections due to bacteria or fungus. The virus that causes COVID-19 is contagious. This means that it can spread from person to person through droplets from coughs and sneezes (respiratory secretions). What are the causes? This illness is caused by a virus. You may catch the virus by:  Breathing in droplets from an infected person's cough or sneeze.  Touching something, like a table or a doorknob, that  was exposed to the virus (contaminated) and then touching your mouth, nose, or eyes. What increases the risk? Risk for infection You are more likely to be infected with this virus if you:  Live in or travel to an area with a COVID-19 outbreak.  Come in contact with a sick person who recently traveled to an area with a COVID-19 outbreak.  Provide care for or live with a person who is infected with COVID-19. Risk for serious illness You are more likely to become seriously ill from the virus if you:  Are 51 years of age or older.  Have a long-term disease that lowers your body's ability to fight infection (immunocompromised).  Live in a nursing home or long-term care facility.  Have a long-term (chronic) disease such as: ? Chronic lung disease, including chronic obstructive pulmonary disease or asthma ? Heart disease. ? Diabetes. ? Chronic kidney disease. ? Liver disease.  Are obese. What are the signs or symptoms? Symptoms of this condition can range from mild to severe. Symptoms may appear any time from 2 to 14 days after being exposed to the virus. They include:  A fever.  A cough.  Difficulty breathing.  Chills.  Muscle pains.  A sore throat.  Loss of taste or smell. Some people may also have stomach problems, such as nausea, vomiting, or diarrhea. Other people may not have any symptoms of COVID-19. How is this diagnosed? This condition may be diagnosed based on:  Your signs and symptoms, especially if: ? You live in an area with a COVID-19 outbreak. ? You recently traveled to or from an area where the virus is common. ? You provide care for or live with a person who was diagnosed with COVID-19.  A physical exam.  Lab tests, which may include: ? A nasal swab to take a sample of fluid from your nose. ? A throat swab to take a sample of fluid from your throat. ? A sample of mucus from your lungs (sputum). ? Blood tests.  Imaging tests, which may include,  X-rays, CT scan, or ultrasound. How is this treated? At present, there is no medicine to treat COVID-19. Medicines that treat other diseases are being used on a trial basis to see if they are effective against COVID-19. Your health care provider will talk with you about ways to treat your symptoms. For most people, the infection is mild and can be managed at home with rest, fluids, and over-the-counter medicines. Treatment for a serious infection usually takes places in a hospital intensive care unit (ICU). It may include one or more of the following treatments. These treatments are given until your symptoms improve.  Receiving fluids and medicines through an IV.  Supplemental oxygen. Extra oxygen is given through a tube in the nose, a face mask, or a hood.  Positioning you to lie on your stomach (prone position). This makes it easier for oxygen to get into the lungs.  Continuous positive airway pressure (CPAP) or bi-level positive airway pressure (BPAP) machine. This treatment uses mild air pressure to keep the airways open. A tube that is connected to a motor delivers oxygen to the body.  Ventilator. This treatment moves air into and out of the lungs by using a tube that is placed in your windpipe.  Tracheostomy. This is a procedure to create a hole in the neck so that a breathing tube can be inserted.  Extracorporeal membrane oxygenation (ECMO). This procedure gives the lungs a chance to recover by taking over the functions of the heart and lungs. It supplies oxygen to the body and removes carbon dioxide. Follow these instructions at home: Lifestyle  If you are sick, stay home except to get medical care. Your health care provider will tell you how long to stay home. Call your health care provider before you go for medical care.  Rest at home as told by your health care provider.  Do not use any products that contain nicotine or tobacco, such as cigarettes, e-cigarettes, and chewing  tobacco. If you need help quitting, ask your health care provider.  Return to your normal activities as told by your health care provider. Ask your health care provider what activities are safe for you. General instructions  Take over-the-counter and prescription medicines only as told by your health care provider.  Drink enough fluid to keep your urine pale yellow.  Keep all follow-up visits as told by your health care provider. This is important. How is this prevented?  There is no vaccine to help prevent COVID-19 infection. However, there are steps you can take to protect yourself and others from this virus. To protect yourself:   Do not travel  to areas where COVID-19 is a risk. The areas where COVID-19 is reported change often. To identify high-risk areas and travel restrictions, check the CDC travel website: FatFares.com.br  If you live in, or must travel to, an area where COVID-19 is a risk, take precautions to avoid infection. ? Stay away from people who are sick. ? Wash your hands often with soap and water for 20 seconds. If soap and water are not available, use an alcohol-based hand sanitizer. ? Avoid touching your mouth, face, eyes, or nose. ? Avoid going out in public, follow guidance from your state and local health authorities. ? If you must go out in public, wear a cloth face covering or face mask. ? Disinfect objects and surfaces that are frequently touched every day. This may include:  Counters and tables.  Doorknobs and light switches.  Sinks and faucets.  Electronics, such as phones, remote controls, keyboards, computers, and tablets. To protect others: If you have symptoms of COVID-19, take steps to prevent the virus from spreading to others.  If you think you have a COVID-19 infection, contact your health care provider right away. Tell your health care team that you think you may have a COVID-19 infection.  Stay home. Leave your house only to seek  medical care. Do not use public transport.  Do not travel while you are sick.  Wash your hands often with soap and water for 20 seconds. If soap and water are not available, use alcohol-based hand sanitizer.  Stay away from other members of your household. Let healthy household members care for children and pets, if possible. If you have to care for children or pets, wash your hands often and wear a mask. If possible, stay in your own room, separate from others. Use a different bathroom.  Make sure that all people in your household wash their hands well and often.  Cough or sneeze into a tissue or your sleeve or elbow. Do not cough or sneeze into your hand or into the air.  Wear a cloth face covering or face mask. Where to find more information  Centers for Disease Control and Prevention: PurpleGadgets.be  World Health Organization: https://www.castaneda.info/ Contact a health care provider if:  You live in or have traveled to an area where COVID-19 is a risk and you have symptoms of the infection.  You have had contact with someone who has COVID-19 and you have symptoms of the infection. Get help right away if:  You have trouble breathing.  You have pain or pressure in your chest.  You have confusion.  You have bluish lips and fingernails.  You have difficulty waking from sleep.  You have symptoms that get worse. These symptoms may represent a serious problem that is an emergency. Do not wait to see if the symptoms will go away. Get medical help right away. Call your local emergency services (911 in the [place] Do not drive yourself to the hospital. Let the emergency medical personnel know if you think you have COVID-19. Summary  COVID-19 is a respiratory infection that is caused by a virus. It is also known as coronavirus disease or novel coronavirus. It can cause serious infections, such as pneumonia, acute respiratory distress syndrome,  acute respiratory failure, or sepsis.  The virus that causes COVID-19 is contagious. This means that it can spread from person to person through droplets from coughs and sneezes.  You are more likely to develop a serious illness if you are 32 years of age or  older, have a weak immunity, live in a nursing home, or have chronic disease.  There is no medicine to treat COVID-19. Your health care provider will talk with you about ways to treat your symptoms.  Take steps to protect yourself and others from infection. Wash your hands often and disinfect objects and surfaces that are frequently touched every day. Stay away from people who are sick and wear a mask if you are sick. This information is not intended to replace advice given to you by your health care provider. Make sure you discuss any questions you have with your health care provider. Document Released: 10/01/2018 Document Revised: 01/21/2019 Document Reviewed: 10/01/2018 Elsevier Patient Education  2020 [employer].

## 2019-08-22 NOTE — Progress Notes (Signed)
Received a call from Magda Kiel the pharmacist at [facility], [place]. Asking for OK to fill 30G 1/2 inch needles with 30G 1/4 inch needles. I gave authorization per Dr. Sloan Leiter to fill the 30G 1/4 inches neddles.

## 2019-08-23 LAB — GLUCOSE, CAPILLARY: Glucose-Capillary: 319 mg/dL — ABNORMAL HIGH (ref 70–99)

## 2019-08-26 ENCOUNTER — Encounter (INDEPENDENT_AMBULATORY_CARE_PROVIDER_SITE_OTHER): Payer: Self-pay

## 2019-08-27 ENCOUNTER — Encounter (INDEPENDENT_AMBULATORY_CARE_PROVIDER_SITE_OTHER): Payer: Self-pay

## 2019-08-28 ENCOUNTER — Encounter (INDEPENDENT_AMBULATORY_CARE_PROVIDER_SITE_OTHER): Payer: Self-pay

## 2019-08-28 ENCOUNTER — Telehealth: Payer: Self-pay

## 2019-08-28 NOTE — Telephone Encounter (Signed)
Per pt MyChart questionnaire pt worsening cough. Cough is dry cough. Not keeping up at night. C/O weakness. Pt noted cough is more frequent today. Pt waiting for sitz bath and has hemorrhoid cream.  Advised pt to treat cough with OTC coughing medication and monitor cough. Advised pt that if cough worsens, if cough is keeping her up at night or cough becomes productive with yellow or green secretions, to call PCP.  Pt advised to drink warm fluids and suck on cough drops or hard candy. Pt advised to take 2 tsp of honey at bedtime to suppress cough. Advised pt if cough is productive do not take a cough suppressant to avoid pneumonia. Pt verbalized understanding.

## 2019-08-29 ENCOUNTER — Encounter (INDEPENDENT_AMBULATORY_CARE_PROVIDER_SITE_OTHER): Payer: Self-pay

## 2019-08-30 ENCOUNTER — Encounter (INDEPENDENT_AMBULATORY_CARE_PROVIDER_SITE_OTHER): Payer: Self-pay

## 2019-08-31 ENCOUNTER — Encounter (INDEPENDENT_AMBULATORY_CARE_PROVIDER_SITE_OTHER): Payer: Self-pay

## 2019-09-01 ENCOUNTER — Encounter (INDEPENDENT_AMBULATORY_CARE_PROVIDER_SITE_OTHER): Payer: Self-pay

## 2019-09-01 ENCOUNTER — Other Ambulatory Visit: Payer: Self-pay | Admitting: Internal Medicine

## 2019-09-01 DIAGNOSIS — J1282 Pneumonia due to coronavirus disease 2019: Secondary | ICD-10-CM

## 2019-09-03 ENCOUNTER — Encounter (INDEPENDENT_AMBULATORY_CARE_PROVIDER_SITE_OTHER): Payer: Self-pay

## 2019-09-04 ENCOUNTER — Encounter (INDEPENDENT_AMBULATORY_CARE_PROVIDER_SITE_OTHER): Payer: Self-pay

## 2019-09-21 ENCOUNTER — Encounter: Payer: Self-pay | Admitting: Family Medicine

## 2019-09-21 ENCOUNTER — Other Ambulatory Visit: Payer: Self-pay

## 2019-09-21 ENCOUNTER — Ambulatory Visit (INDEPENDENT_AMBULATORY_CARE_PROVIDER_SITE_OTHER): Payer: Self-pay | Admitting: Family Medicine

## 2019-09-21 VITALS — BP 145/88 | HR 107 | Ht 67.0 in | Wt 287.2 lb

## 2019-09-21 DIAGNOSIS — R7303 Prediabetes: Secondary | ICD-10-CM

## 2019-09-21 DIAGNOSIS — R059 Cough, unspecified: Secondary | ICD-10-CM

## 2019-09-21 DIAGNOSIS — E66813 Obesity, class 3: Secondary | ICD-10-CM

## 2019-09-21 DIAGNOSIS — Z8616 Personal history of COVID-19: Secondary | ICD-10-CM

## 2019-09-21 DIAGNOSIS — E119 Type 2 diabetes mellitus without complications: Secondary | ICD-10-CM

## 2019-09-21 DIAGNOSIS — Z6841 Body Mass Index (BMI) 40.0 and over, adult: Secondary | ICD-10-CM

## 2019-09-21 DIAGNOSIS — R05 Cough: Secondary | ICD-10-CM

## 2019-09-21 DIAGNOSIS — R634 Abnormal weight loss: Secondary | ICD-10-CM

## 2019-09-21 DIAGNOSIS — Z131 Encounter for screening for diabetes mellitus: Secondary | ICD-10-CM

## 2019-09-21 DIAGNOSIS — Z09 Encounter for follow-up examination after completed treatment for conditions other than malignant neoplasm: Secondary | ICD-10-CM

## 2019-09-21 LAB — GLUCOSE, POCT (MANUAL RESULT ENTRY): POC Glucose: 156 mg/dl — AB (ref 70–99)

## 2019-09-21 LAB — POCT URINALYSIS DIPSTICK
Bilirubin, UA: NEGATIVE
Blood, UA: NEGATIVE
Glucose, UA: NEGATIVE
Ketones, UA: NEGATIVE
Nitrite, UA: NEGATIVE
Protein, UA: POSITIVE — AB
Spec Grav, UA: >=1.03 — AB (ref 1.010–1.025)
Urobilinogen, UA: 1 E.U./dL
pH, UA: 5.5 (ref 5.0–8.0)

## 2019-09-21 MED ORDER — HYDROCODONE-HOMATROPINE 5-1.5 MG/5ML PO SYRP: 5.0000 mL | ORAL_SOLUTION | Freq: Three times a day (TID) | ORAL | 0 refills | Status: DC | PRN

## 2019-09-21 NOTE — Patient Instructions (Signed)
Diabetes Mellitus and Exercise Exercising regularly is important for your overall health, especially when you have diabetes (diabetes mellitus). Exercising is not only about losing weight. It has many other health benefits, such as increasing muscle strength and bone density and reducing body fat and stress. This leads to improved fitness, flexibility, and endurance, all of which result in better overall health. Exercise has additional benefits for people with diabetes, including:  Reducing appetite.  Helping to lower and control blood glucose.  Lowering blood pressure.  Helping to control amounts of fatty substances (lipids) in the blood, such as cholesterol and triglycerides.  Helping the body to respond better to insulin (improving insulin sensitivity).  Reducing how much insulin the body needs.  Decreasing the risk for heart disease by: ? Lowering cholesterol and triglyceride levels. ? Increasing the levels of good cholesterol. ? Lowering blood glucose levels. What is my activity plan? Your health care provider or certified diabetes educator can help you make a plan for the type and frequency of exercise (activity plan) that works for you. Make sure that you:  Do at least 150 minutes of moderate-intensity or vigorous-intensity exercise each week. This could be brisk walking, biking, or water aerobics. ? Do stretching and strength exercises, such as yoga or weightlifting, at least 2 times a week. ? Spread out your activity over at least 3 days of the week.  Get some form of physical activity every day. ? Do not go more than 2 days in a row without some kind of physical activity. ? Avoid being inactive for more than 30 minutes at a time. Take frequent breaks to walk or stretch.  Choose a type of exercise or activity that you enjoy, and set realistic goals.  Start slowly, and gradually increase the intensity of your exercise over time. What do I need to know about managing my  diabetes?   Check your blood glucose before and after exercising. ? If your blood glucose is 240 mg/dL (13.3 mmol/L) or higher before you exercise, check your urine for ketones. If you have ketones in your urine, do not exercise until your blood glucose returns to normal. ? If your blood glucose is 100 mg/dL (5.6 mmol/L) or lower, eat a snack containing 15-20 grams of carbohydrate. Check your blood glucose 15 minutes after the snack to make sure that your level is above 100 mg/dL (5.6 mmol/L) before you start your exercise.  Know the symptoms of low blood glucose (hypoglycemia) and how to treat it. Your risk for hypoglycemia increases during and after exercise. Common symptoms of hypoglycemia can include: ? Hunger. ? Anxiety. ? Sweating and feeling clammy. ? Confusion. ? Dizziness or feeling light-headed. ? Increased heart rate or palpitations. ? Blurry vision. ? Tingling or numbness around the mouth, lips, or tongue. ? Tremors or shakes. ? Irritability.  Keep a rapid-acting carbohydrate snack available before, during, and after exercise to help prevent or treat hypoglycemia.  Avoid injecting insulin into areas of the body that are going to be exercised. For example, avoid injecting insulin into: ? The arms, when playing tennis. ? The legs, when jogging.  Keep records of your exercise habits. Doing this can help you and your health care provider adjust your diabetes management plan as needed. Write down: ? Food that you eat before and after you exercise. ? Blood glucose levels before and after you exercise. ? The type and amount of exercise you have done. ? When your insulin is expected to peak, if you use   insulin. Avoid exercising at times when your insulin is peaking.  When you start a new exercise or activity, work with your health care provider to make sure the activity is safe for you, and to adjust your insulin, medicines, or food intake as needed.  Drink plenty of water while  you exercise to prevent dehydration or heat stroke. Drink enough fluid to keep your urine clear or pale yellow. Summary  Exercising regularly is important for your overall health, especially when you have diabetes (diabetes mellitus).  Exercising has many health benefits, such as increasing muscle strength and bone density and reducing body fat and stress.  Your health care provider or certified diabetes educator can help you make a plan for the type and frequency of exercise (activity plan) that works for you.  When you start a new exercise or activity, work with your health care provider to make sure the activity is safe for you, and to adjust your insulin, medicines, or food intake as needed. This information is not intended to replace advice given to you by your health care provider. Make sure you discuss any questions you have with your health care provider. Document Revised: 03/20/2017 Document Reviewed: 02/05/2016 Elsevier Patient Education  [place] DASH stands for "Dietary Approaches to Stop Hypertension." The DASH eating plan is a healthy eating plan that has been shown to reduce high blood pressure (hypertension). It may also reduce your risk for type 2 diabetes, heart disease, and stroke. The DASH eating plan may also help with weight loss. What are tips for following this plan?  General guidelines  Avoid eating more than 2,300 mg (milligrams) of salt (sodium) a day. If you have hypertension, you may need to reduce your sodium intake to 1,500 mg a day.  Limit alcohol intake to no more than 1 drink a day for nonpregnant women and 2 drinks a day for men. One drink equals 12 oz of beer, 5 oz of wine, or 1 oz of hard liquor.  Work with your health care provider to maintain a healthy body weight or to lose weight. Ask what an ideal weight is for you.  Get at least 30 minutes of exercise that causes your heart to beat faster (aerobic exercise) most days of  the week. Activities may include walking, swimming, or biking.  Work with your health care provider or diet and nutrition specialist (dietitian) to adjust your eating plan to your individual calorie needs. Reading food labels   Check food labels for the amount of sodium per serving. Choose foods with less than 5 percent of the Daily Value of sodium. Generally, foods with less than 300 mg of sodium per serving fit into this eating plan.  To find whole grains, look for the word "whole" as the first word in the ingredient list. Shopping  Buy products labeled as "low-sodium" or "no salt added."  Buy fresh foods. Avoid canned foods and premade or frozen meals. Cooking  Avoid adding salt when cooking. Use salt-free seasonings or herbs instead of table salt or sea salt. Check with your health care provider or pharmacist before using salt substitutes.  Do not fry foods. Cook foods using healthy methods such as baking, boiling, grilling, and broiling instead.  Cook with heart-healthy oils, such as olive, canola, soybean, or sunflower oil. Meal planning  Eat a balanced diet that includes: ? 5 or more servings of fruits and vegetables each day. At each meal, try to fill half of your  plate with fruits and vegetables. ? Up to 6-8 servings of whole grains each day. ? Less than 6 oz of lean meat, poultry, or fish each day. A 3-oz serving of meat is about the same size as a deck of cards. One egg equals 1 oz. ? 2 servings of low-fat dairy each day. ? A serving of nuts, seeds, or beans 5 times each week. ? Heart-healthy fats. Healthy fats called Omega-3 fatty acids are found in foods such as flaxseeds and coldwater fish, like sardines, salmon, and mackerel.  Limit how much you eat of the following: ? Canned or prepackaged foods. ? Food that is high in trans fat, such as fried foods. ? Food that is high in saturated fat, such as fatty meat. ? Sweets, desserts, sugary drinks, and other foods with  added sugar. ? Full-fat dairy products.  Do not salt foods before eating.  Try to eat at least 2 vegetarian meals each week.  Eat more home-cooked food and less restaurant, buffet, and fast food.  When eating at a restaurant, ask that your food be prepared with less salt or no salt, if possible. What foods are recommended? The items listed may not be a complete list. Talk with your dietitian about what dietary choices are best for you. Grains Whole-grain or whole-wheat bread. Whole-grain or whole-wheat pasta. Brown rice. [place]. Bulgur. Whole-grain and low-sodium cereals. Pita bread. Low-fat, low-sodium crackers. Whole-wheat flour tortillas. Vegetables Fresh or frozen vegetables (raw, steamed, roasted, or grilled). Low-sodium or reduced-sodium tomato and vegetable juice. Low-sodium or reduced-sodium tomato sauce and tomato paste. Low-sodium or reduced-sodium canned vegetables. Fruits All fresh, dried, or frozen fruit. Canned fruit in natural juice (without added sugar). Meat and other protein foods Skinless chicken or [place]. Ground chicken or [place]. Pork with fat trimmed off. Fish and seafood. Egg whites. Dried beans, peas, or lentils. Unsalted nuts, nut butters, and seeds. Unsalted canned beans. Lean cuts of beef with fat trimmed off. Low-sodium, lean deli meat. Dairy Low-fat (1%) or fat-free (skim) milk. Fat-free, low-fat, or reduced-fat cheeses. Nonfat, low-sodium ricotta or cottage cheese. Low-fat or nonfat yogurt. Low-fat, low-sodium cheese. Fats and oils Soft margarine without trans fats. Vegetable oil. Low-fat, reduced-fat, or light mayonnaise and salad dressings (reduced-sodium). Canola, safflower, olive, soybean, and sunflower oils. Avocado. Seasoning and other foods Herbs. Spices. Seasoning mixes without salt. Unsalted popcorn and pretzels. Fat-free sweets. What foods are not recommended? The items listed may not be a complete list. Talk with your dietitian about what  dietary choices are best for you. Grains Baked goods made with fat, such as croissants, muffins, or some breads. Dry pasta or rice meal packs. Vegetables Creamed or fried vegetables. Vegetables in a cheese sauce. Regular canned vegetables (not low-sodium or reduced-sodium). Regular canned tomato sauce and paste (not low-sodium or reduced-sodium). Regular tomato and vegetable juice (not low-sodium or reduced-sodium). Angie Fava. Olives. Fruits Canned fruit in a light or heavy syrup. Fried fruit. Fruit in cream or butter sauce. Meat and other protein foods Fatty cuts of meat. Ribs. Fried meat. Berniece Salines. Sausage. Bologna and other processed lunch meats. Salami. Fatback. Hotdogs. Bratwurst. Salted nuts and seeds. Canned beans with added salt. Canned or smoked fish. Whole eggs or egg yolks. Chicken or [place] with skin. Dairy Whole or 2% milk, cream, and half-and-half. Whole or full-fat cream cheese. Whole-fat or sweetened yogurt. Full-fat cheese. Nondairy creamers. Whipped toppings. Processed cheese and cheese spreads. Fats and oils Butter. Stick margarine. Lard. Shortening. Ghee. Bacon fat. Tropical oils, such as  coconut, palm kernel, or palm oil. Seasoning and other foods Salted popcorn and pretzels. Onion salt, garlic salt, seasoned salt, table salt, and sea salt. Worcestershire sauce. Tartar sauce. Barbecue sauce. Teriyaki sauce. Soy sauce, including reduced-sodium. Steak sauce. Canned and packaged gravies. Fish sauce. Oyster sauce. Cocktail sauce. Horseradish that you find on the shelf. Ketchup. Mustard. Meat flavorings and tenderizers. Bouillon cubes. Hot sauce and Tabasco sauce. Premade or packaged marinades. Premade or packaged taco seasonings. Relishes. Regular salad dressings. Where to find more information:  National Heart, Lung, and Blood Institute: PopSteam.is  American Heart Association: www.heart.org Summary  The DASH eating plan is a healthy eating plan that has been shown to reduce  high blood pressure (hypertension). It may also reduce your risk for type 2 diabetes, heart disease, and stroke.  With the DASH eating plan, you should limit salt (sodium) intake to 2,300 mg a day. If you have hypertension, you may need to reduce your sodium intake to 1,500 mg a day.  When on the DASH eating plan, aim to eat more fresh fruits and vegetables, whole grains, lean proteins, low-fat dairy, and heart-healthy fats.  Work with your health care provider or diet and nutrition specialist (dietitian) to adjust your eating plan to your individual calorie needs. This information is not intended to replace advice given to you by your health care provider. Make sure you discuss any questions you have with your health care provider. Document Revised: 08/08/2017 Document Reviewed: 08/19/2016 Elsevier Patient Education  2020 Elsevier Inc. Homatropine; Hydrocodone oral syrup What is this medicine? HYDROCODONE (hye droe KOE done) is used to help relieve cough. This medicine may be used for other purposes; ask your health care provider or pharmacist if you have questions. COMMON BRAND NAME(S): Hycodan, Hydromet, Hydropane, Mycodone What should I tell my health care provider before I take this medicine? They need to know if you have any of these conditions:  Addison's disease  brain tumor  gallbladder disease  glaucoma  head injury  heart disease  history of a drug or alcohol abuse problem  history of irregular heartbeat  if you often drink alcohol  kidney disease  liver disease  low blood pressure  lung or breathing disease, like asthma  mental illness  pancreatic disease  seizures  stomach or intestine problems  thyroid disease  trouble passing urine  an unusual or allergic reaction to hydrocodone, other medicines, foods, dyes, or preservatives  pregnant or trying to get pregnant  breast-feeding How should I use this medicine? Take this medicine by mouth.  Follow the directions on the prescription label. You can take it with or without food. If it upsets your stomach, take it with food. Use a specially marked spoon or container to measure each dose. Ask your pharmacist if you do not have one. Household spoons are not accurate. Do not to overfill. Rinse the measuring device with water after each use. Take your medicine at regular intervals. Do not take it more often than directed. A special MedGuide will be given to you by the pharmacist with each prescription and refill. Be sure to read this information carefully each time. Talk to your pediatrician regarding the use of this medicine in children. This medicine is not approved for use in children. Overdosage: If you think you have taken too much of this medicine contact a poison control center or emergency room at once. NOTE: This medicine is only for you. Do not share this medicine with others. What if I miss a  dose? If you miss a dose, take it as soon as you can. If it is almost time for your next dose, take only that dose. Do not take double or extra doses. What may interact with this medicine? Do not take this medicine with any of the following medications:  alcohol  antihistamines for allergy, cough and cold  certain medicines for anxiety or sleep  certain medicines for depression like amitriptyline, fluoxetine, sertraline  certain medicines for seizures like carbamazepine, phenobarbital, phenytoin, primidone  general anesthetics like halothane, isoflurane, methoxyflurane, propofol  local anesthetics like lidocaine, pramoxine, tetracaine  MAOIs like Carbex, Eldepryl, Marplan, Nardil, and Parnate  other narcotic medicines (opiates) for pain or cough  phenothiazines like chlorpromazine, mesoridazine, prochlorperazine, thioridazine This medicine may also interact with the following medications:  antiviral medicines for HIV and AIDS  atropine  certain antibiotics like clarithromycin,  erythromycin  certain medicines for bladder problems like oxybutynin, tolterodine  certain medicines for fungal infections like ketoconazole and itraconazole  certain medicines for Parkinson's disease like benztropine, trihexyphenidyl  certain medicines for stomach problems like dicyclomine, hyoscyamine  certain medicines for travel sickness like scopolamine  ipratropium  rifampin This list may not describe all possible interactions. Give your health care provider a list of all the medicines, herbs, non-prescription drugs, or dietary supplements you use. Also tell them if you smoke, drink alcohol, or use illegal drugs. Some items may interact with your medicine. What should I watch for while using this medicine? Use exactly as directed by your doctor or health care professional. Do not take more than the recommended dose. You may develop tolerance to this medicine if you take it for a long time. Tolerance means that you will get less cough relief with time. Tell your doctor or health care professional if your symptoms do not improve or if they get worse. If you have been taking this medicine for a long time, do not suddenly stop taking it because you may develop a severe reaction. Your body becomes used to the medicine. This does NOT mean you are addicted. Addiction is a behavior related to getting and using a drug for a nonmedical reason. If your doctor wants you to stop the medicine, the dose will be slowly lowered over time to avoid any side effects. There are different types of narcotic medicines (opiates). If you take more than one type at the same time or if you are taking another medicine that also causes drowsiness, you may have more side effects. Give your health care provider a list of all medicines you use. Your doctor will tell you how much medicine to take. Do not take more medicine than directed. Call emergency for help if you have problems breathing or unusual sleepiness. You may  get drowsy or dizzy. Do not drive, use machinery, or do anything that needs mental alertness until you know how this medicine affects you. Do not stand or sit up quickly, especially if you are an older patient. This reduces the risk of dizzy or fainting spells. Alcohol may interfere with the effect of this medicine. Avoid alcoholic drinks. This medicine will cause constipation. Try to have a bowel movement at least every 2 to 3 days. If you do not have a bowel movement for 3 days, call your doctor or health care professional. Your mouth may get dry. Chewing sugarless gum or sucking hard candy, and drinking plenty of water may help. Contact your doctor if the problem does not go away or is severe. What  side effects may I notice from receiving this medicine? Side effects that you should report to your doctor or health care professional as soon as possible:  allergic reactions like skin rash, itching or hives, swelling of the face, lips, or tongue  breathing problems  confusion  signs and symptoms of low blood pressure like dizziness; feeling faint or lightheaded, falls; unusually weak or tired  trouble passing urine or change in the amount of urine Side effects that usually do not require medical attention (report to your doctor or health care professional if they continue or are bothersome):  constipation  dry mouth  nausea, vomiting  tiredness This list may not describe all possible side effects. Call your doctor for medical advice about side effects. You may report side effects to FDA at 1-800-FDA-1088. Where should I keep my medicine? Keep out of the reach of children. This medicine can be abused. Keep your medicine in a safe place to protect it from theft. Do not share this medicine with anyone. Selling or giving away this medicine is dangerous and against the law. This medicine may cause accidental overdose and death if taken by other adults, children, or pets. Mix any unused medicine  with a substance like cat littler or coffee grounds. Then throw the medicine away in a sealed container like a sealed bag or a coffee can with a lid. Do not use the medicine after the expiration date. Store at room temperature between 15 and 30 degrees C (59 and 86 degrees F). Protect from light. NOTE: This sheet is a summary. It may not cover all possible information. If you have questions about this medicine, talk to your doctor, pharmacist, or health care provider.  2020 Elsevier/Gold Standard (2017-03-20 16:00:40) Cough, Adult A cough helps to clear your throat and lungs. A cough may be a sign of an illness or another medical condition. An acute cough may only last 2-3 weeks, while a chronic cough may last 8 or more weeks. Many things can cause a cough. They include:  Germs (viruses or bacteria) that attack the airway.  Breathing in things that bother (irritate) your lungs.  Allergies.  Asthma.  Mucus that runs down the back of your throat (postnasal drip).  Smoking.  Acid backing up from the stomach into the tube that moves food from the mouth to the stomach (gastroesophageal reflux).  Some medicines.  Lung problems.  Other medical conditions, such as heart failure or a blood clot in the lung (pulmonary embolism). Follow these instructions at home: Medicines  Take over-the-counter and prescription medicines only as told by your doctor.  Talk with your doctor before you take medicines that stop a cough (coughsuppressants). Lifestyle   Do not smoke, and try not to be around smoke. Do not use any products that contain nicotine or tobacco, such as cigarettes, e-cigarettes, and chewing tobacco. If you need help quitting, ask your doctor.  Drink enough fluid to keep your pee (urine) pale yellow.  Avoid caffeine.  Do not drink alcohol if your doctor tells you not to drink. General instructions   Watch for any changes in your cough. Tell your doctor about them.  Always  cover your mouth when you cough.  Stay away from things that make you cough, such as perfume, candles, campfire smoke, or cleaning products.  If the air is dry, use a cool mist vaporizer or humidifier in your home.  If your cough is worse at night, try using extra pillows to raise your head  up higher while you sleep.  Rest as needed.  Keep all follow-up visits as told by your doctor. This is important. Contact a doctor if:  You have new symptoms.  You cough up pus.  Your cough does not get better after 2-3 weeks, or your cough gets worse.  Cough medicine does not help your cough and you are not sleeping well.  You have pain that gets worse or pain that is not helped with medicine.  You have a fever.  You are losing weight and you do not know why.  You have night sweats. Get help right away if:  You cough up blood.  You have trouble breathing.  Your heartbeat is very fast. These symptoms may be an emergency. Do not wait to see if the symptoms will go away. Get medical help right away. Call your local emergency services (911 in the [place] Do not drive yourself to the hospital. Summary  A cough helps to clear your throat and lungs. Many things can cause a cough.  Take over-the-counter and prescription medicines only as told by your doctor.  Always cover your mouth when you cough.  Contact a doctor if you have new symptoms or you have a cough that does not get better or gets worse. This information is not intended to replace advice given to you by your health care provider. Make sure you discuss any questions you have with your health care provider. Document Revised: 09/14/2018 Document Reviewed: 09/14/2018 Elsevier Patient Education  [place].

## 2019-09-21 NOTE — Progress Notes (Signed)
Patient [facility]   Established Patient Office Visit  Subjective:  Patient ID: Valerie Moss, female    DOB: 06-02-1976  Age: 44 y.o. MRN: [id number]  CC:  Chief Complaint  Patient presents with  . Follow-up    RTW - COVID 09/16/2018    HPI Valerie Moss is a 44 year old female who presents for Follow Up today.   Past Medical History:  Diagnosis Date  . Anemia   . Arrhythmia   . Coronavirus infection 08/17/2019  . Cough   . Eczema   . Elevated liver enzymes   . Hypertension   . Hypocalcemia   . Hypokalemia   . Obese   . Pneumonia   . Vitamin D deficiency    Current Status: Since her last office visit, she has had an Hospital Admission for Coronavirus from 08/17/2019-08/22/2019. She is doing well with no complaints. She continues to have a cough. She is taking Robitussin with no relief. She denies visual changes, chest pain, shortness of breath, heart palpitations, and falls. She has occasional headaches and dizziness with position changes. Denies severe headaches, confusion, seizures, double vision, and blurred vision, nausea and vomiting. She denies fevers, chills, fatigue, recent infections, weight loss, and night sweats. No reports of GI problems such as diarrhea, and constipation. She has no reports of blood in stools, dysuria and hematuria. No depression or anxiety reported today. She denies suicidal ideations, homicidal ideations, or auditory hallucinations. She denies pain today.   History reviewed. No pertinent surgical history.  Family History  Problem Relation Age of Onset  . Hypertension Mother     Social History   Socioeconomic History  . Marital status: Single    Spouse name: Not on file  . Number of children: Not on file  . Years of education: Not on file  . Highest education level: Not on file  Occupational History  . Not on file  Tobacco Use  . Smoking status: Never Smoker  . Smokeless tobacco: Never  Used  Substance and Sexual Activity  . Alcohol use: Never  . Drug use: Never  . Sexual activity: Not Currently    Partners: Male  Other Topics Concern  . Not on file  Social History Narrative  . Not on file   Social Determinants of Health   Financial Resource Strain: Low Risk   . Difficulty of Paying Living Expenses: Not hard at all  Food Insecurity: No Food Insecurity  . Worried About Charity fundraiser in the Last Year: Never true  . Ran Out of Food in the Last Year: Never true  Transportation Needs: No Transportation Needs  . Lack of Transportation (Medical): No  . Lack of Transportation (Non-Medical): No  Physical Activity: Sufficiently Active  . Days of Exercise per Week: 7 days  . Minutes of Exercise per Session: 150+ min  Stress: No Stress Concern Present  . Feeling of Stress : Not at all  Social Connections: Severely Isolated  . Frequency of Communication with Friends and Family: Twice a week  . Frequency of Social Gatherings with Friends and Family: Never  . Attends Religious Services: Never  . Active Member of Clubs or Organizations: No  . Attends Archivist Meetings: Never  . Marital Status: Never married  Intimate Partner Violence: Not At Risk  . Fear of Current or Ex-Partner: No  . Emotionally Abused: No  . Physically Abused: No  . Sexually Abused: No    Outpatient  Medications Prior to Visit  Medication Sig Dispense Refill  . acetaminophen (TYLENOL) 500 MG tablet Take 1,000 mg by mouth every 6 (six) hours as needed for mild pain.    Marland Kitchen albuterol (VENTOLIN HFA) 108 (90 Base) MCG/ACT inhaler Inhale 2 puffs into the lungs every 6 (six) hours as needed for wheezing or shortness of breath.     Marland Kitchen amLODipine (NORVASC) 10 MG tablet Take 1 tablet (10 mg total) by mouth daily. 30 tablet 3  . benzonatate (TESSALON) 100 MG capsule Take 100 mg by mouth 3 (three) times daily as needed for cough.    . blood glucose meter kit and supplies Dispense based on patient  and insurance preference. Use up to four times daily as directed. (FOR ICD-10 E10.9, E11.9). 1 each 0  . hydrochlorothiazide (HYDRODIURIL) 25 MG tablet Take 1 tablet (25 mg total) by mouth daily. 30 tablet 3  . insulin NPH-regular Human (NOVOLIN 70/30) (70-30) 100 UNIT/ML injection Inject 25 Units into the skin 2 (two) times daily with a meal. 10 mL 3  . Insulin Pen Needle (PEN NEEDLES) 30G X 5 MM MISC 1 each by Does not apply route 2 (two) times daily. 100 each 0  . Insulin Syringes, Disposable, U-100 0.3 ML MISC 1 each by Does not apply route 2 (two) times daily. 100 each 0  . metFORMIN (GLUCOPHAGE) 500 MG tablet Take 1 tablet (500 mg total) by mouth 2 (two) times daily with a meal. 60 tablet 0  . NEEDLE, DISP, 30 G (BD DISP NEEDLES) 30G X 1/2" MISC 1 each by Does not apply route 2 (two) times daily. 100 each 0   No facility-administered medications prior to visit.    No Known Allergies  ROS Review of Systems  Constitutional: Negative.   HENT: Negative.   Eyes: Negative.   Respiratory: Positive for cough (frequent).   Cardiovascular: Negative.   Gastrointestinal: Positive for abdominal distention.  Endocrine: Negative.   Genitourinary: Negative.   Musculoskeletal: Negative.   Skin: Negative.   Allergic/Immunologic: Negative.   Neurological: Negative.   Hematological: Negative.   Psychiatric/Behavioral: Negative.       Objective:    Physical Exam  Constitutional: She is oriented to person, place, and time. She appears well-developed and well-nourished.  HENT:  Head: Normocephalic and atraumatic.  Eyes: Conjunctivae are normal.  Cardiovascular: Normal rate, regular rhythm, normal heart sounds and intact distal pulses.  Pulmonary/Chest: Effort normal and breath sounds normal.  Abdominal: Soft. Bowel sounds are normal. She exhibits distension (obese).  Musculoskeletal:        General: Normal range of motion.     Cervical back: Normal range of motion and neck supple.    Neurological: She is alert and oriented to person, place, and time. She has normal reflexes.  Skin: Skin is warm and dry.  Psychiatric: She has a normal mood and affect. Her behavior is normal. Judgment and thought content normal.  Nursing note and vitals reviewed.   BP (!) 145/88   Pulse (!) 107   Ht 5' 7"  (1.702 m)   Wt 287 lb 3.2 oz (130.3 kg)   SpO2 99%   BMI 44.98 kg/m  Wt Readings from Last 3 Encounters:  09/21/19 287 lb 3.2 oz (130.3 kg)  08/17/19 286 lb (129.7 kg)  02/15/19 289 lb (131.1 kg)     Health Maintenance Due  Topic Date Due  . PNEUMOCOCCAL POLYSACCHARIDE VACCINE AGE 66-64 HIGH RISK  07/24/1979  . FOOT EXAM  07/24/1987  .  OPHTHALMOLOGY EXAM  07/24/1987  . URINE MICROALBUMIN  07/24/1987  . TETANUS/TDAP  07/23/1996  . PAP SMEAR-Modifier  07/23/1998  . INFLUENZA VACCINE  04/10/2019    There are no preventive care reminders to display for this patient.  Lab Results  Component Value Date   TSH 0.648 02/15/2019   Lab Results  Component Value Date   WBC 7.2 09/21/2019   HGB 12.8 09/21/2019   HCT 38.8 09/21/2019   MCV 84 09/21/2019   PLT 408 09/21/2019   Lab Results  Component Value Date   NA 138 09/21/2019   K 4.2 09/21/2019   CO2 21 09/21/2019   GLUCOSE 159 (H) 09/21/2019   BUN 8 09/21/2019   CREATININE 0.78 09/21/2019   BILITOT 1.0 09/21/2019   ALKPHOS 85 09/21/2019   AST 19 09/21/2019   ALT 38 (H) 09/21/2019   PROT 7.1 09/21/2019   ALBUMIN 4.3 09/21/2019   CALCIUM 10.0 09/21/2019   ANIONGAP 12 08/22/2019   Lab Results  Component Value Date   CHOL 195 01/13/2019   Lab Results  Component Value Date   HDL 45 01/13/2019   Lab Results  Component Value Date   LDLCALC 130 (H) 01/13/2019   Lab Results  Component Value Date   TRIG 108 08/17/2019   Lab Results  Component Value Date   CHOLHDL 4.3 01/13/2019   Lab Results  Component Value Date   HGBA1C 8.2 (H) 08/17/2019      Assessment & Plan:   1. Hospital discharge  follow-up  2. History of 2019 novel coronavirus disease (COVID-19) Recently diagnosed with Coronavirus on 08/17/2019. After quarantine time, she is doing well.   3. Cough We will initiate Hycodan today.  - HYDROcodone-homatropine (HYCODAN) 5-1.5 MG/5ML syrup; Take 5 mLs by mouth every 8 (eight) hours as needed for cough.  Dispense: 120 mL; Refill: 0  4. Type 2 diabetes mellitus without complication, without long-term current use of insulin (HCC) She will continue medication as prescribed, to decrease foods/beverages high in sugars and carbs and follow Heart Healthy or DASH diet. Increase physical activity to at least 30 minutes cardio exercise daily.  - CBC with Differential - Comp Met (CMET); Future - Comp Met (CMET)  5. Prediabetes   6. Hemoglobin A1c between 7% and 9% which indicates poor diabetic control Hgb A1c at 8.2 on 08/17/2019.  6. Class 3 severe obesity due to excess calories with serious comorbidity and body mass index (BMI) of 40.0 to 44.9 in adult [facility]) Body mass index is 44.98 kg/m. Goal BMI  is <30. Encouraged efforts to reduce weight include engaging in physical activity as tolerated with goal of 150 minutes per week. Improve dietary choices and eat a meal regimen consistent with a Mediterranean or DASH diet. Reduce simple carbohydrates. Do not skip meals and eat healthy snacks throughout the day to avoid over-eating at dinner. Set a goal weight loss that is achievable for you.  7. Weight loss She has had a 10 lb weight loss in 10 months.   8. Screening for diabetes mellitus - Urinalysis Dipstick - Glucose (CBG)  9. Follow up She will follow up in 3 months.   Meds ordered this encounter  Medications  . DISCONTD: HYDROcodone-homatropine (HYCODAN) 5-1.5 MG/5ML syrup    Sig: Take 5 mLs by mouth every 8 (eight) hours as needed for cough.    Dispense:  120 mL    Refill:  0  . HYDROcodone-homatropine (HYCODAN) 5-1.5 MG/5ML syrup    Sig: Take 5 mLs  by mouth every 8  (eight) hours as needed for cough.    Dispense:  120 mL    Refill:  0    Orders Placed This Encounter  Procedures  . CBC with Differential  . Comp Met (CMET)  . Urinalysis Dipstick  . Glucose (CBG)    Referral Orders  No referral(s) requested today    Kathe Becton,  MSN, FNP-BC [place], [place] [address] [phone] fax    Problem List Items Addressed This Visit      Endocrine   Type 2 diabetes mellitus without complication ([place]) (Chronic)   Relevant Orders   CBC with Differential (Completed)   Comp Met (CMET) (Completed)     Other   Cough   Relevant Medications   HYDROcodone-homatropine (HYCODAN) 5-1.5 MG/5ML syrup    Other Visit Diagnoses    Hospital discharge follow-up    -  Primary   History of 2019 novel coronavirus disease (COVID-19)       Prediabetes       Class 3 severe obesity due to excess calories with serious comorbidity and body mass index (BMI) of 40.0 to 44.9 in adult ([place])       Weight loss       Screening for diabetes mellitus       Relevant Orders   Urinalysis Dipstick (Completed)   Glucose (CBG) (Completed)   Follow up          Meds ordered this encounter  Medications  . DISCONTD: HYDROcodone-homatropine (HYCODAN) 5-1.5 MG/5ML syrup    Sig: Take 5 mLs by mouth every 8 (eight) hours as needed for cough.    Dispense:  120 mL    Refill:  0  . HYDROcodone-homatropine (HYCODAN) 5-1.5 MG/5ML syrup    Sig: Take 5 mLs by mouth every 8 (eight) hours as needed for cough.    Dispense:  120 mL    Refill:  0    Follow-up: Return in about 3 months (around 12/20/2019).    Azzie Glatter, FNP

## 2019-09-22 ENCOUNTER — Encounter: Payer: Self-pay | Admitting: Family Medicine

## 2019-09-22 LAB — COMPREHENSIVE METABOLIC PANEL
ALT: 38 IU/L — ABNORMAL HIGH (ref 0–32)
AST: 19 IU/L (ref 0–40)
Albumin/Globulin Ratio: 1.5 (ref 1.2–2.2)
Albumin: 4.3 g/dL (ref 3.8–4.8)
Alkaline Phosphatase: 85 IU/L (ref 39–117)
BUN/Creatinine Ratio: 10 (ref 9–23)
BUN: 8 mg/dL (ref 6–24)
Bilirubin Total: 1 mg/dL (ref 0.0–1.2)
CO2: 21 mmol/L (ref 20–29)
Calcium: 10 mg/dL (ref 8.7–10.2)
Chloride: 101 mmol/L (ref 96–106)
Creatinine, Ser: 0.78 mg/dL (ref 0.57–1.00)
GFR calc Af Amer: 108 mL/min/{1.73_m2} (ref >59)
GFR calc non Af Amer: 94 mL/min/{1.73_m2} (ref >59)
Globulin, Total: 2.8 g/dL (ref 1.5–4.5)
Glucose: 159 mg/dL — ABNORMAL HIGH (ref 65–99)
Potassium: 4.2 mmol/L (ref 3.5–5.2)
Sodium: 138 mmol/L (ref 134–144)
Total Protein: 7.1 g/dL (ref 6.0–8.5)

## 2019-09-22 LAB — CBC WITH DIFFERENTIAL/PLATELET
Basophils Absolute: 0 10*3/uL (ref 0.0–0.2)
Basos: 0 %
EOS (ABSOLUTE): 0 10*3/uL (ref 0.0–0.4)
Eos: 0 %
Hematocrit: 38.8 % (ref 34.0–46.6)
Hemoglobin: 12.8 g/dL (ref 11.1–15.9)
Immature Grans (Abs): 0 10*3/uL (ref 0.0–0.1)
Immature Granulocytes: 0 %
Lymphocytes Absolute: 2.3 10*3/uL (ref 0.7–3.1)
Lymphs: 32 %
MCH: 27.6 pg (ref 26.6–33.0)
MCHC: 33 g/dL (ref 31.5–35.7)
MCV: 84 fL (ref 79–97)
Monocytes Absolute: 0.5 10*3/uL (ref 0.1–0.9)
Monocytes: 7 %
Neutrophils Absolute: 4.3 10*3/uL (ref 1.4–7.0)
Neutrophils: 61 %
Platelets: 408 10*3/uL (ref 150–450)
RBC: 4.64 x10E6/uL (ref 3.77–5.28)
RDW: 13.1 % (ref 11.7–15.4)
WBC: 7.2 10*3/uL (ref 3.4–10.8)

## 2019-09-23 DIAGNOSIS — Z8616 Personal history of COVID-19: Secondary | ICD-10-CM | POA: Insufficient documentation

## 2019-09-23 DIAGNOSIS — R7303 Prediabetes: Secondary | ICD-10-CM | POA: Insufficient documentation

## 2019-09-23 DIAGNOSIS — R634 Abnormal weight loss: Secondary | ICD-10-CM | POA: Insufficient documentation

## 2019-09-23 DIAGNOSIS — E119 Type 2 diabetes mellitus without complications: Secondary | ICD-10-CM | POA: Insufficient documentation

## 2019-09-30 ENCOUNTER — Other Ambulatory Visit: Payer: Self-pay

## 2019-12-20 ENCOUNTER — Other Ambulatory Visit: Payer: Self-pay

## 2019-12-20 ENCOUNTER — Ambulatory Visit (INDEPENDENT_AMBULATORY_CARE_PROVIDER_SITE_OTHER): Payer: Self-pay | Admitting: Family Medicine

## 2019-12-20 ENCOUNTER — Encounter: Payer: Self-pay | Admitting: Family Medicine

## 2019-12-20 VITALS — BP 138/90 | HR 98 | Temp 98.7°F | Ht 67.0 in | Wt 290.8 lb

## 2019-12-20 DIAGNOSIS — E119 Type 2 diabetes mellitus without complications: Secondary | ICD-10-CM

## 2019-12-20 DIAGNOSIS — R7309 Other abnormal glucose: Secondary | ICD-10-CM

## 2019-12-20 DIAGNOSIS — E66813 Obesity, class 3: Secondary | ICD-10-CM

## 2019-12-20 DIAGNOSIS — Z6841 Body Mass Index (BMI) 40.0 and over, adult: Secondary | ICD-10-CM

## 2019-12-20 DIAGNOSIS — R059 Cough, unspecified: Secondary | ICD-10-CM

## 2019-12-20 DIAGNOSIS — Z09 Encounter for follow-up examination after completed treatment for conditions other than malignant neoplasm: Secondary | ICD-10-CM

## 2019-12-20 DIAGNOSIS — G4700 Insomnia, unspecified: Secondary | ICD-10-CM

## 2019-12-20 DIAGNOSIS — R739 Hyperglycemia, unspecified: Secondary | ICD-10-CM

## 2019-12-20 DIAGNOSIS — R634 Abnormal weight loss: Secondary | ICD-10-CM

## 2019-12-20 DIAGNOSIS — Z8616 Personal history of COVID-19: Secondary | ICD-10-CM

## 2019-12-20 DIAGNOSIS — R05 Cough: Secondary | ICD-10-CM

## 2019-12-20 LAB — POCT URINALYSIS DIPSTICK
Bilirubin, UA: NEGATIVE
Glucose, UA: POSITIVE — AB
Ketones, UA: NEGATIVE
Leukocytes, UA: NEGATIVE
Nitrite, UA: NEGATIVE
Protein, UA: POSITIVE — AB
Spec Grav, UA: >=1.03 — AB
Urobilinogen, UA: 0.2 U/dL
pH, UA: 5.5

## 2019-12-20 LAB — POCT GLYCOSYLATED HEMOGLOBIN (HGB A1C): Hemoglobin A1C: 9.7 % — AB (ref 4.0–5.6)

## 2019-12-20 LAB — GLUCOSE, POCT (MANUAL RESULT ENTRY): POC Glucose: 336 mg/dL — AB (ref 70–99)

## 2019-12-20 MED ORDER — METFORMIN HCL 500 MG PO TABS: 500.0000 mg | ORAL_TABLET | Freq: Two times a day (BID) | ORAL | 6 refills | Status: DC

## 2019-12-20 MED ORDER — NOVOLIN 70/30 (70-30) 100 UNIT/ML ~~LOC~~ SUSP: 25.0000 [IU] | Freq: Two times a day (BID) | SUBCUTANEOUS | 6 refills | Status: DC

## 2019-12-20 MED ORDER — TRAZODONE HCL 50 MG PO TABS: 50.0000 mg | ORAL_TABLET | Freq: Every day | ORAL | 6 refills | Status: DC

## 2019-12-20 NOTE — Patient Instructions (Signed)
Insomnia Insomnia is a sleep disorder that makes it difficult to fall asleep or stay asleep. Insomnia can cause fatigue, low energy, difficulty concentrating, mood swings, and poor performance at work or school. There are three different ways to classify insomnia:  Difficulty falling asleep.  Difficulty staying asleep.  Waking up too early in the morning. Any type of insomnia can be long-term (chronic) or short-term (acute). Both are common. Short-term insomnia usually lasts for three months or less. Chronic insomnia occurs at least three times a week for longer than three months. What are the causes? Insomnia may be caused by another condition, situation, or substance, such as:  Anxiety.  Certain medicines.  Gastroesophageal reflux disease (GERD) or other gastrointestinal conditions.  Asthma or other breathing conditions.  Restless legs syndrome, sleep apnea, or other sleep disorders.  Chronic pain.  Menopause.  Stroke.  Abuse of alcohol, tobacco, or illegal drugs.  Mental health conditions, such as depression.  Caffeine.  Neurological disorders, such as Alzheimer's disease.  An overactive thyroid (hyperthyroidism). Sometimes, the cause of insomnia may not be known. What increases the risk? Risk factors for insomnia include:  Gender. Women are affected more often than men.  Age. Insomnia is more common as you get older.  Stress.  Lack of exercise.  Irregular work schedule or working night shifts.  Traveling between different time zones.  Certain medical and mental health conditions. What are the signs or symptoms? If you have insomnia, the main symptom is having trouble falling asleep or having trouble staying asleep. This may lead to other symptoms, such as:  Feeling fatigued or having low energy.  Feeling nervous about going to sleep.  Not feeling rested in the morning.  Having trouble concentrating.  Feeling irritable, anxious, or depressed. How  is this diagnosed? This condition may be diagnosed based on:  Your symptoms and medical history. Your health care provider may ask about: ? Your sleep habits. ? Any medical conditions you have. ? Your mental health.  A physical exam. How is this treated? Treatment for insomnia depends on the cause. Treatment may focus on treating an underlying condition that is causing insomnia. Treatment may also include:  Medicines to help you sleep.  Counseling or therapy.  Lifestyle adjustments to help you sleep better. Follow these instructions at home: Eating and drinking   Limit or avoid alcohol, caffeinated beverages, and cigarettes, especially close to bedtime. These can disrupt your sleep.  Do not eat a large meal or eat spicy foods right before bedtime. This can lead to digestive discomfort that can make it hard for you to sleep. Sleep habits   Keep a sleep diary to help you and your health care provider figure out what could be causing your insomnia. Write down: ? When you sleep. ? When you wake up during the night. ? How well you sleep. ? How rested you feel the next day. ? Any side effects of medicines you are taking. ? What you eat and drink.  Make your bedroom a dark, comfortable place where it is easy to fall asleep. ? Put up shades or blackout curtains to block light from outside. ? Use a white noise machine to block noise. ? Keep the temperature cool.  Limit screen use before bedtime. This includes: ? Watching TV. ? Using your smartphone, tablet, or computer.  Stick to a routine that includes going to bed and waking up at the same times every day and night. This can help you fall asleep faster. Consider   making a quiet activity, such as reading, part of your nighttime routine.  Try to avoid taking naps during the day so that you sleep better at night.  Get out of bed if you are still awake after 15 minutes of trying to sleep. Keep the lights down, but try reading or  doing a quiet activity. When you feel sleepy, go back to bed. General instructions  Take over-the-counter and prescription medicines only as told by your health care provider.  Exercise regularly, as told by your health care provider. Avoid exercise starting several hours before bedtime.  Use relaxation techniques to manage stress. Ask your health care provider to suggest some techniques that may work well for you. These may include: ? Breathing exercises. ? Routines to release muscle tension. ? Visualizing peaceful scenes.  Make sure that you drive carefully. Avoid driving if you feel very sleepy.  Keep all follow-up visits as told by your health care provider. This is important. Contact a health care provider if:  You are tired throughout the day.  You have trouble in your daily routine due to sleepiness.  You continue to have sleep problems, or your sleep problems get worse. Get help right away if:  You have serious thoughts about hurting yourself or someone else. If you ever feel like you may hurt yourself or others, or have thoughts about taking your own life, get help right away. You can go to your nearest emergency department or call:  Your local emergency services (911 in the [place]  A suicide crisis helpline, such as the National Suicide Prevention Lifeline at 1-800-273-8255. This is open 24 hours a day. Summary  Insomnia is a sleep disorder that makes it difficult to fall asleep or stay asleep.  Insomnia can be long-term (chronic) or short-term (acute).  Treatment for insomnia depends on the cause. Treatment may focus on treating an underlying condition that is causing insomnia.  Keep a sleep diary to help you and your health care provider figure out what could be causing your insomnia. This information is not intended to replace advice given to you by your health care provider. Make sure you discuss any questions you have with your health care provider. Document  Revised: 08/08/2017 Document Reviewed: 06/05/2017 Elsevier Patient Education  2020 Else[employer] Trazodone tablets What is this medicine? TRAZODONE (TRAZ oh done) is used to treat depression. This medicine may be used for other purposes; ask your health care provider or pharmacist if you have questions. COMMON BRAND NAME(S): Desyrel What should I tell my health care provider before I take this medicine? They need to know if you have any of these conditions:  attempted suicide or thinking about it  bipolar disorder  bleeding problems  glaucoma  heart disease, or previous heart attack  irregular heart beat  kidney or liver disease  low levels of sodium in the blood  an unusual or allergic reaction to trazodone, other medicines, foods, dyes or preservatives  pregnant or trying to get pregnant  breast-feeding How should I use this medicine? Take this medicine by mouth with a glass of water. Follow the directions on the prescription label. Take this medicine shortly after a meal or a light snack. Take your medicine at regular intervals. Do not take your medicine more often than directed. Do not stop taking this medicine suddenly except upon the advice of your doctor. Stopping this medicine too quickly may cause serious side effects or your condition may worsen. A special MedGuide will be given   to you by the pharmacist with each prescription and refill. Be sure to read this information carefully each time. Talk to your pediatrician regarding the use of this medicine in children. Special care may be needed. Overdosage: If you think you have taken too much of this medicine contact a poison control center or emergency room at once. NOTE: This medicine is only for you. Do not share this medicine with others. What if I miss a dose? If you miss a dose, take it as soon as you can. If it is almost time for your next dose, take only that dose. Do not take double or extra doses. What may  interact with this medicine? Do not take this medicine with any of the following medications:  certain medicines for fungal infections like fluconazole, itraconazole, ketoconazole, posaconazole, voriconazole  cisapride  dronedarone  linezolid  MAOIs like Carbex, Eldepryl, Marplan, Nardil, and Parnate  mesoridazine  methylene blue (injected into a vein)  pimozide  saquinavir  thioridazine This medicine may also interact with the following medications:  alcohol  antiviral medicines for HIV or AIDS  aspirin and aspirin-like medicines  barbiturates like phenobarbital  certain medicines for blood pressure, heart disease, irregular heart beat  certain medicines for depression, anxiety, or psychotic disturbances  certain medicines for migraine headache like almotriptan, eletriptan, frovatriptan, naratriptan, rizatriptan, sumatriptan, zolmitriptan  certain medicines for seizures like carbamazepine and phenytoin  certain medicines for sleep  certain medicines that treat or prevent blood clots like dalteparin, enoxaparin, warfarin  digoxin  fentanyl  lithium  NSAIDS, medicines for pain and inflammation, like ibuprofen or naproxen  other medicines that prolong the QT interval (cause an abnormal heart rhythm) like dofetilide  rasagiline  supplements like St. John's wort, kava kava, valerian  tramadol  tryptophan This list may not describe all possible interactions. Give your health care provider a list of all the medicines, herbs, non-prescription drugs, or dietary supplements you use. Also tell them if you smoke, drink alcohol, or use illegal drugs. Some items may interact with your medicine. What should I watch for while using this medicine? Tell your doctor if your symptoms do not get better or if they get worse. Visit your doctor or health care professional for regular checks on your progress. Because it may take several weeks to see the full effects of this  medicine, it is important to continue your treatment as prescribed by your doctor. Patients and their families should watch out for new or worsening thoughts of suicide or depression. Also watch out for sudden changes in feelings such as feeling anxious, agitated, panicky, irritable, hostile, aggressive, impulsive, severely restless, overly excited and hyperactive, or not being able to sleep. If this happens, especially at the beginning of treatment or after a change in dose, call your health care professional. You may get drowsy or dizzy. Do not drive, use machinery, or do anything that needs mental alertness until you know how this medicine affects you. Do not stand or sit up quickly, especially if you are an older patient. This reduces the risk of dizzy or fainting spells. Alcohol may interfere with the effect of this medicine. Avoid alcoholic drinks. This medicine may cause dry eyes and blurred vision. If you wear contact lenses you may feel some discomfort. Lubricating drops may help. See your eye doctor if the problem does not go away or is severe. Your mouth may get dry. Chewing sugarless gum, sucking hard candy and drinking plenty of water may help. Contact your doctor   if the problem does not go away or is severe. What side effects may I notice from receiving this medicine? Side effects that you should report to your doctor or health care professional as soon as possible:  allergic reactions like skin rash, itching or hives, swelling of the face, lips, or tongue  elevated mood, decreased need for sleep, racing thoughts, impulsive behavior  confusion  fast, irregular heartbeat  feeling faint or lightheaded, falls  feeling agitated, angry, or irritable  loss of balance or coordination  painful or prolonged erections  restlessness, pacing, inability to keep still  suicidal thoughts or other mood changes  tremors  trouble sleeping  seizures  unusual bleeding or bruising Side  effects that usually do not require medical attention (report to your doctor or health care professional if they continue or are bothersome):  change in sex drive or performance  change in appetite or weight  constipation  headache  muscle aches or pains  nausea This list may not describe all possible side effects. Call your doctor for medical advice about side effects. You may report side effects to FDA at 1-800-FDA-1088. Where should I keep my medicine? Keep out of the reach of children. Store at room temperature between 15 and 30 degrees C (59 to 86 degrees F). Protect from light. Keep container tightly closed. Throw away any unused medicine after the expiration date. NOTE: This sheet is a summary. It may not cover all possible information. If you have questions about this medicine, talk to your doctor, pharmacist, or health care provider.  2020 Elsevier/Gold Standard (2018-08-18 11:46:46)  

## 2019-12-20 NOTE — Progress Notes (Signed)
 [facility]   Established Patient Follow Up   Subjective:  Patient ID: Valerie Moss, female    DOB: 04/05/1976  Age: 43 y.o. MRN: [id number]  CC:  Chief Complaint  Patient presents with  . Follow-up    DM  . Alopecia    Hair loss     HPI Valerie Moss is a 43 year old female who presents to Re-establish care today.   Past Medical History:  Diagnosis Date  . Anemia   . Arrhythmia   . Coronavirus infection 08/17/2019  . Cough   . Eczema   . Elevated liver enzymes   . Hypertension   . Hypocalcemia   . Hypokalemia   . Obese   . Pneumonia   . Vitamin D deficiency    Current Status: Since her last office visit, she is doing well with no complaints. She continues to work towards her weight loss goal. She has not been taking Metformin and Insulin as prescribed. She has seen low range of and high of 290 since his last office visit. She denies fatigue, frequent urination, blurred vision, excessive hunger, excessive thirst, weight gain, weight loss, and poor wound healing. She continues to check her feet regularly. She denies fevers, chills, fatigue, recent infections, weight loss, and night sweats. She has not had any headaches, visual changes, dizziness, and falls. No chest pain, heart palpitations, cough and shortness of breath reported. No reports of GI problems such as nausea, vomiting, diarrhea, and constipation. She has no reports of blood in stools, dysuria and hematuria. Her anxiety is moderate today, r/t personal and family stressors. She denies suicidal ideations, homicidal ideations, or auditory hallucinations. She denies pain today.   History reviewed. No pertinent surgical history.  Family History  Problem Relation Age of Onset  . Hypertension Mother     Social History   Socioeconomic History  . Marital status: Single    Spouse name: Not on file  . Number of children: Not on file  . Years of education: Not  on file  . Highest education level: Not on file  Occupational History  . Not on file  Tobacco Use  . Smoking status: Never Smoker  . Smokeless tobacco: Never Used  Substance and Sexual Activity  . Alcohol use: Never  . Drug use: Never  . Sexual activity: Not Currently    Partners: Male  Other Topics Concern  . Not on file  Social History Narrative  . Not on file   Social Determinants of Health   Financial Resource Strain: Low Risk   . Difficulty of Paying Living Expenses: Not hard at all  Food Insecurity: No Food Insecurity  . Worried About Running Out of Food in the Last Year: Never true  . Ran Out of Food in the Last Year: Never true  Transportation Needs: No Transportation Needs  . Lack of Transportation (Medical): No  . Lack of Transportation (Non-Medical): No  Physical Activity: Sufficiently Active  . Days of Exercise per Week: 7 days  . Minutes of Exercise per Session: 150+ min  Stress: No Stress Concern Present  . Feeling of Stress : Not at all  Social Connections: Severely Isolated  . Frequency of Communication with Friends and Family: Twice a week  . Frequency of Social Gatherings with Friends and Family: Never  . Attends Religious Services: Never  . Active Member of Clubs or Organizations: No  . Attends Club or Organization Meetings: Never  .   Marital Status: Never married  Intimate Partner Violence: Not At Risk  . Fear of Current or Ex-Partner: No  . Emotionally Abused: No  . Physically Abused: No  . Sexually Abused: No    Outpatient Medications Prior to Visit  Medication Sig Dispense Refill  . amLODipine (NORVASC) 10 MG tablet Take 1 tablet (10 mg total) by mouth daily. 30 tablet 3  . blood glucose meter kit and supplies Dispense based on patient and insurance preference. Use up to four times daily as directed. (FOR ICD-10 E10.9, E11.9). 1 each 0  . hydrochlorothiazide (HYDRODIURIL) 25 MG tablet Take 1 tablet (25 mg total) by mouth daily. 30 tablet 3  .  Insulin Pen Needle (PEN NEEDLES) 30G X 5 MM MISC 1 each by Does not apply route 2 (two) times daily. 100 each 0  . Insulin Syringes, Disposable, U-100 0.3 ML MISC 1 each by Does not apply route 2 (two) times daily. 100 each 0  . NEEDLE, DISP, 30 G (BD DISP NEEDLES) 30G X 1/2" MISC 1 each by Does not apply route 2 (two) times daily. 100 each 0  . insulin NPH-regular Human (NOVOLIN 70/30) (70-30) 100 UNIT/ML injection Inject 25 Units into the skin 2 (two) times daily with a meal. 10 mL 3  . acetaminophen (TYLENOL) 500 MG tablet Take 1,000 mg by mouth every 6 (six) hours as needed for mild pain.    Marland Kitchen albuterol (VENTOLIN HFA) 108 (90 Base) MCG/ACT inhaler Inhale 2 puffs into the lungs every 6 (six) hours as needed for wheezing or shortness of breath.     . benzonatate (TESSALON) 100 MG capsule Take 100 mg by mouth 3 (three) times daily as needed for cough.    Marland Kitchen HYDROcodone-homatropine (HYCODAN) 5-1.5 MG/5ML syrup Take 5 mLs by mouth every 8 (eight) hours as needed for cough. 120 mL 0  . metFORMIN (GLUCOPHAGE) 500 MG tablet Take 1 tablet (500 mg total) by mouth 2 (two) times daily with a meal. (Patient not taking: Reported on 12/20/2019) 60 tablet 0   No facility-administered medications prior to visit.    No Known Allergies  ROS Review of Systems  Constitutional: Negative.   HENT: Negative.   Eyes: Negative.   Respiratory: Negative.   Cardiovascular: Negative.   Gastrointestinal: Negative.   Endocrine: Negative.   Genitourinary: Negative.   Musculoskeletal: Negative.   Skin: Negative.   Allergic/Immunologic: Negative.   Neurological: Positive for dizziness (occasional ) and headaches (occasional ).  Hematological: Negative.   Psychiatric/Behavioral: Negative.       Objective:    Physical Exam  Constitutional: She is oriented to person, place, and time. She appears well-developed and well-nourished.  HENT:  Head: Normocephalic and atraumatic.  Eyes: Conjunctivae are normal.    Cardiovascular: Normal rate, regular rhythm, normal heart sounds and intact distal pulses.  Pulmonary/Chest: Effort normal and breath sounds normal.  Abdominal: Soft. Bowel sounds are normal.  Musculoskeletal:        General: Normal range of motion.     Cervical back: Normal range of motion and neck supple.  Neurological: She is alert and oriented to person, place, and time. She has normal reflexes.  Skin: Skin is warm and dry.  Psychiatric: She has a normal mood and affect. Her behavior is normal. Judgment and thought content normal.  Nursing note and vitals reviewed.   BP 138/90 Comment: Pt reported not taking bp med daily  Pulse 98   Temp 98.7 F (37.1 C)   Ht 5' 7" (  1.702 m)   Wt 290 lb 12.8 oz (131.9 kg)   SpO2 97%   BMI 45.55 kg/m  Wt Readings from Last 3 Encounters:  12/20/19 290 lb 12.8 oz (131.9 kg)  09/21/19 287 lb 3.2 oz (130.3 kg)  08/17/19 286 lb (129.7 kg)     Health Maintenance Due  Topic Date Due  . PNEUMOCOCCAL POLYSACCHARIDE VACCINE AGE 43-64 HIGH RISK  Never done  . FOOT EXAM  Never done  . OPHTHALMOLOGY EXAM  Never done  . URINE MICROALBUMIN  Never done  . TETANUS/TDAP  Never done  . PAP SMEAR-Modifier  Never done    There are no preventive care reminders to display for this patient.  Lab Results  Component Value Date   TSH 2.300 12/20/2019   Lab Results  Component Value Date   WBC 6.3 12/20/2019   HGB 14.0 12/20/2019   HCT 43.2 12/20/2019   MCV 81 12/20/2019   PLT 382 12/20/2019   Lab Results  Component Value Date   NA 140 12/20/2019   K 4.7 12/20/2019   CO2 22 12/20/2019   GLUCOSE 294 (H) 12/20/2019   BUN 10 12/20/2019   CREATININE 0.78 12/20/2019   BILITOT 0.5 12/20/2019   ALKPHOS 78 12/20/2019   AST 9 12/20/2019   ALT 15 12/20/2019   PROT 7.2 12/20/2019   ALBUMIN 4.4 12/20/2019   CALCIUM 9.8 12/20/2019   ANIONGAP 12 08/22/2019   Lab Results  Component Value Date   CHOL 202 (H) 12/20/2019   Lab Results  Component Value  Date   HDL 43 12/20/2019   Lab Results  Component Value Date   LDLCALC 132 (H) 12/20/2019   Lab Results  Component Value Date   TRIG 151 (H) 12/20/2019   Lab Results  Component Value Date   CHOLHDL 4.7 (H) 12/20/2019   Lab Results  Component Value Date   HGBA1C 9.7 (A) 12/20/2019      Assessment & Plan:   1. Type 2 diabetes mellitus without complication, without long-term current use of insulin (HCC) She will continue medication as prescribed, to decrease foods/beverages high in sugars and carbs and follow Heart Healthy or DASH diet. Increase physical activity to at least 30 minutes cardio exercise daily.  - POCT glycosylated hemoglobin (Hb A1C) - POCT urinalysis dipstick - POCT glucose (manual entry) - Referral to Nutrition and Diabetes Services - insulin NPH-regular Human (NOVOLIN 70/30) (70-30) 100 UNIT/ML injection; Inject 25 Units into the skin 2 (two) times daily with a meal.  Dispense: 10 mL; Refill: 6 - metFORMIN (GLUCOPHAGE) 500 MG tablet; Take 1 tablet (500 mg total) by mouth 2 (two) times daily with a meal.  Dispense: 60 tablet; Refill: 6 - CBC with Differential - Comprehensive metabolic panel - TSH - Lipid Panel - Vitamin B12 - Vitamin D, 25-hydroxy  2. Hemoglobin A1C greater than 9%, indicating poor diabetic control Worsened. Hgb A1c increased at 9.7 today, from 8.2 on 08/17/2019. We will continue to monitor.   3. Hyperglycemia - Referral to Nutrition and Diabetes Services - insulin NPH-regular Human (NOVOLIN 70/30) (70-30) 100 UNIT/ML injection; Inject 25 Units into the skin 2 (two) times daily with a meal.  Dispense: 10 mL; Refill: 6 - metFORMIN (GLUCOPHAGE) 500 MG tablet; Take 1 tablet (500 mg total) by mouth 2 (two) times daily with a meal.  Dispense: 60 tablet; Refill: 6  4. History of 2019 novel coronavirus disease (COVID-19) Diagnosed 08/2019. Stable today.   5. Cough Stable.   6. Class 3  severe obesity due to excess calories with serious  comorbidity and body mass index (BMI) of 40.0 to 44.9 in adult (HCC) Body mass index is 45.55 kg/m.  Goal BMI  is <30. Encouraged efforts to reduce weight include engaging in physical activity as tolerated with goal of 150 minutes per week. Improve dietary choices and eat a meal regimen consistent with a Mediterranean or DASH diet. Reduce simple carbohydrates. Do not skip meals and eat healthy snacks throughout the day to avoid over-eating at dinner. Set a goal weight loss that is achievable for you. - Referral to Nutrition and Diabetes Services  7. Weight loss  8. Insomnia, unspecified type - traZODone (DESYREL) 50 MG tablet; Take 1 tablet (50 mg total) by mouth at bedtime.  Dispense: 30 tablet; Refill: 6  9. Follow up She will follow up in 6 months.   Meds ordered this encounter  Medications  . insulin NPH-regular Human (NOVOLIN 70/30) (70-30) 100 UNIT/ML injection    Sig: Inject 25 Units into the skin 2 (two) times daily with a meal.    Dispense:  10 mL    Refill:  6  . metFORMIN (GLUCOPHAGE) 500 MG tablet    Sig: Take 1 tablet (500 mg total) by mouth 2 (two) times daily with a meal.    Dispense:  60 tablet    Refill:  6  . traZODone (DESYREL) 50 MG tablet    Sig: Take 1 tablet (50 mg total) by mouth at bedtime.    Dispense:  30 tablet    Refill:  6    Orders Placed This Encounter  Procedures  . CBC with Differential  . Comprehensive metabolic panel  . TSH  . Lipid Panel  . Vitamin B12  . Vitamin D, 25-hydroxy  . Referral to Nutrition and Diabetes Services  . POCT glycosylated hemoglobin (Hb A1C)  . POCT urinalysis dipstick  . POCT glucose (manual entry)     Referral Orders     Referral to Nutrition and Diabetes Services    Natalie Stroud,  MSN, FNP-BC [facility] [address]  [place], [place] [address] [phone] fax  Problem List Items Addressed This Visit      Endocrine    Hemoglobin A1C between 7% and 9% indicating borderline diabetic control   Relevant Medications   insulin NPH-regular Human (NOVOLIN 70/30) (70-30) 100 UNIT/ML injection   metFORMIN (GLUCOPHAGE) 500 MG tablet   Type 2 diabetes mellitus without complication (HCC) - Primary (Chronic)   Relevant Medications   insulin NPH-regular Human (NOVOLIN 70/30) (70-30) 100 UNIT/ML injection   metFORMIN (GLUCOPHAGE) 500 MG tablet   Other Relevant Orders   POCT glycosylated hemoglobin (Hb A1C) (Completed)   POCT urinalysis dipstick (Completed)   POCT glucose (manual entry) (Completed)   Referral to Nutrition and Diabetes Services   CBC with Differential (Completed)   Comprehensive metabolic panel (Completed)   TSH (Completed)   Lipid Panel (Completed)   Vitamin B12 (Completed)   Vitamin D, 25-hydroxy (Completed)     Other   Cough   History of 2019 novel coronavirus disease (COVID-19)   Weight loss    Other Visit Diagnoses    Hyperglycemia       Relevant Medications   insulin NPH-regular Human (NOVOLIN 70/30) (70-30) 100 UNIT/ML injection   metFORMIN (GLUCOPHAGE) 500 MG tablet   Other Relevant Orders   Referral to Nutrition and Diabetes Services   Class 3 severe obesity due   to excess calories with serious comorbidity and body mass index (BMI) of 40.0 to 44.9 in adult (HCC)       Relevant Medications   insulin NPH-regular Human (NOVOLIN 70/30) (70-30) 100 UNIT/ML injection   metFORMIN (GLUCOPHAGE) 500 MG tablet   Other Relevant Orders   Referral to Nutrition and Diabetes Services   Insomnia, unspecified type       Relevant Medications   traZODone (DESYREL) 50 MG tablet   Follow up          Meds ordered this encounter  Medications  . insulin NPH-regular Human (NOVOLIN 70/30) (70-30) 100 UNIT/ML injection    Sig: Inject 25 Units into the skin 2 (two) times daily with a meal.    Dispense:  10 mL    Refill:  6  . metFORMIN (GLUCOPHAGE) 500 MG tablet    Sig: Take 1 tablet (500 mg  total) by mouth 2 (two) times daily with a meal.    Dispense:  60 tablet    Refill:  6  . traZODone (DESYREL) 50 MG tablet    Sig: Take 1 tablet (50 mg total) by mouth at bedtime.    Dispense:  30 tablet    Refill:  6    Follow-up: Return in about 6 months (around 06/20/2020).    Natalie M Stroud, FNP 

## 2019-12-21 LAB — COMPREHENSIVE METABOLIC PANEL
ALT: 15 IU/L (ref 0–32)
AST: 9 IU/L (ref 0–40)
Albumin/Globulin Ratio: 1.6 (ref 1.2–2.2)
Albumin: 4.4 g/dL (ref 3.8–4.8)
Alkaline Phosphatase: 78 IU/L (ref 39–117)
BUN/Creatinine Ratio: 13 (ref 9–23)
BUN: 10 mg/dL (ref 6–24)
Bilirubin Total: 0.5 mg/dL (ref 0.0–1.2)
CO2: 22 mmol/L (ref 20–29)
Calcium: 9.8 mg/dL (ref 8.7–10.2)
Chloride: 99 mmol/L (ref 96–106)
Creatinine, Ser: 0.78 mg/dL (ref 0.57–1.00)
GFR calc Af Amer: 108 mL/min/{1.73_m2} (ref >59)
GFR calc non Af Amer: 94 mL/min/{1.73_m2} (ref >59)
Globulin, Total: 2.8 g/dL (ref 1.5–4.5)
Glucose: 294 mg/dL — ABNORMAL HIGH (ref 65–99)
Potassium: 4.7 mmol/L (ref 3.5–5.2)
Sodium: 140 mmol/L (ref 134–144)
Total Protein: 7.2 g/dL (ref 6.0–8.5)

## 2019-12-21 LAB — CBC WITH DIFFERENTIAL/PLATELET
Basophils Absolute: 0 10*3/uL (ref 0.0–0.2)
Basos: 0 %
EOS (ABSOLUTE): 0.1 10*3/uL (ref 0.0–0.4)
Eos: 1 %
Hematocrit: 43.2 % (ref 34.0–46.6)
Hemoglobin: 14 g/dL (ref 11.1–15.9)
Immature Grans (Abs): 0 10*3/uL (ref 0.0–0.1)
Immature Granulocytes: 0 %
Lymphocytes Absolute: 1.7 10*3/uL (ref 0.7–3.1)
Lymphs: 27 %
MCH: 26.4 pg — ABNORMAL LOW (ref 26.6–33.0)
MCHC: 32.4 g/dL (ref 31.5–35.7)
MCV: 81 fL (ref 79–97)
Monocytes Absolute: 0.3 10*3/uL (ref 0.1–0.9)
Monocytes: 5 %
Neutrophils Absolute: 4.2 10*3/uL (ref 1.4–7.0)
Neutrophils: 67 %
Platelets: 382 10*3/uL (ref 150–450)
RBC: 5.31 x10E6/uL — ABNORMAL HIGH (ref 3.77–5.28)
RDW: 11.6 % — ABNORMAL LOW (ref 11.7–15.4)
WBC: 6.3 10*3/uL (ref 3.4–10.8)

## 2019-12-21 LAB — LIPID PANEL
Chol/HDL Ratio: 4.7 ratio — ABNORMAL HIGH (ref 0.0–4.4)
Cholesterol, Total: 202 mg/dL — ABNORMAL HIGH (ref 100–199)
HDL: 43 mg/dL (ref >39)
LDL Chol Calc (NIH): 132 mg/dL — ABNORMAL HIGH (ref 0–99)
Triglycerides: 151 mg/dL — ABNORMAL HIGH (ref 0–149)
VLDL Cholesterol Cal: 27 mg/dL (ref 5–40)

## 2019-12-21 LAB — VITAMIN D 25 HYDROXY (VIT D DEFICIENCY, FRACTURES): Vit D, 25-Hydroxy: 13.9 ng/mL — ABNORMAL LOW (ref 30.0–100.0)

## 2019-12-21 LAB — TSH: TSH: 2.3 u[IU]/mL (ref 0.450–4.500)

## 2019-12-21 LAB — VITAMIN B12: Vitamin B-12: 447 pg/mL (ref 232–1245)

## 2019-12-24 ENCOUNTER — Other Ambulatory Visit: Payer: Self-pay | Admitting: Family Medicine

## 2019-12-24 DIAGNOSIS — E559 Vitamin D deficiency, unspecified: Secondary | ICD-10-CM

## 2019-12-24 MED ORDER — VITAMIN D (ERGOCALCIFEROL) 1.25 MG (50000 UNIT) PO CAPS: 50000.0000 [IU] | ORAL_CAPSULE | ORAL | 6 refills | Status: DC

## 2020-02-22 ENCOUNTER — Encounter: Payer: Medicaid Other | Attending: Family Medicine | Admitting: Registered"

## 2020-02-22 ENCOUNTER — Encounter: Payer: Self-pay | Admitting: Registered"

## 2020-02-22 ENCOUNTER — Other Ambulatory Visit: Payer: Self-pay

## 2020-02-22 DIAGNOSIS — E119 Type 2 diabetes mellitus without complications: Secondary | ICD-10-CM

## 2020-02-22 NOTE — Patient Instructions (Addendum)
Goals:  Follow Diabetes Meal Plan as instructed  Eat 3 meals and 2 snacks, every 3-5 hrs  Aim to have 1/2 plate of non-starchy vegetables + 1/4 plate starch/grains + 1/4 plate protein.   Add lean protein foods to meals/snacks  Monitor glucose levels as instructed by your doctor  Aim for 30 mins of physical activity daily  Bring food record and glucose log to your next nutrition visit  Check into a mental health provider to help with managing work stress

## 2020-02-22 NOTE — Progress Notes (Signed)
Diabetes Self-Management Education  Visit Type:  First/Initial  Appt. Start Time: 10:29 Appt. End Time: 11:40  02/22/2020  [name], identified by name and date of birth, is a 44 y.o. female with a diagnosis of Diabetes: Type 2.   ASSESSMENT  Pt expectations: better understanding of different foods   States she had COVID in 08/2019 and spent 5 days in ICU. States she also had pneumonia in 12/2019.   States she sometimes she forgets to take insulin when stressed or rushing to get to work in the mornings. Reports work is stressful sometimes due to having to meet deadlines. States sometimes she will not take her insulin at night.   Recent lab: 9.7 (12/2019), 8.2 (08/2019), 6.6 (01/2019); elevated Chol (202) and elevated Trg (151).   States she checks BS 1-2x/day. FBS (178-298), before lunch (257), and at bedtime (195).  States she sips on 2 bottles of water + juice until lunch time. States she will skip dinner sometimes and drink juice. States she has not been eating any fruit other than juice.  States she has been avoiding all fruits, bread, rice, and pasta.   Reports her mom and brother are her size. States dad is thin.   There were no vitals taken for this visit. There is no height or weight on file to calculate BMI.    Diabetes Self-Management Education - 02/22/20 1039      Initial Visit   What type of meal plan do you follow? low carb (sometimes)      Health Coping   How would you rate your overall health? Fair      Psychosocial Assessment   Patient Belief/Attitude about Diabetes Motivated to manage diabetes    Self-care barriers None    Self-management support None    Patient Concerns Nutrition/Meal planning    Special Needs None    Preferred Learning Style No preference indicated    Learning Readiness Ready      Complications   Last HgB A1C per patient/outside source 9.7 %    How often do you check your blood sugar? 1-2 times/day    Fasting Blood glucose  range (mg/dL) >417    Postprandial Blood glucose range (mg/dL) >408    Number of hypoglycemic episodes per month 0    Number of hyperglycemic episodes per week 1    Can you tell when your blood sugar is high? Yes    What do you do if your blood sugar is high? takes metformin    Have you had a dilated eye exam in the past 12 months? No    Have you had a dental exam in the past 12 months? No    Are you checking your feet? No      Dietary Intake   Breakfast typically skips and will drink 2 bottles of water + juice    Lunch [employer] - roasted chicken, carrots, potatoes    Dinner sometimes skips and will drink juice    Beverage(s) water (4-6*16 oz; 64+ oz), juice (16 oz)      Exercise   Exercise Type ADL's   averages 6000 steps/day   How many days per week to you exercise? 0    How many minutes per day do you exercise? 0    Total minutes per week of exercise 0      Patient Education   Previous Diabetes Education No    Disease state  Definition of diabetes, type 1 and 2, and the  diagnosis of diabetes;Factors that contribute to the development of diabetes    Nutrition management  Reviewed blood glucose goals for pre and post meals and how to evaluate the patients' food intake on their blood glucose level.;Food label reading, portion sizes and measuring food.;Role of diet in the treatment of diabetes and the relationship between the three main macronutrients and blood glucose level;Information on hints to eating out and maintain blood glucose control.    Physical activity and exercise  Role of exercise on diabetes management, blood pressure control and cardiac health.    Monitoring Purpose and frequency of SMBG.;Taught/discussed recording of test results and interpretation of SMBG.;Interpreting lab values - A1C, lipid, urine microalbumina.;Identified appropriate SMBG and/or A1C goals.    Acute complications Taught treatment of hypoglycemia - the 15 rule.;Discussed and identified patients'  treatment of hyperglycemia.    Chronic complications Applicable immunizations;Relationship between chronic complications and blood glucose control;Lipid levels, blood glucose control and heart disease;Identified and discussed with patient  current chronic complications;Reviewed with patient heart disease, higher risk of, and prevention    Psychosocial adjustment Role of stress on diabetes;Worked with patient to identify barriers to care and solutions      Individualized Goals (developed by patient)   Nutrition Follow meal plan discussed    Physical Activity 30 minutes per day;Exercise 3-5 times per week    Medications take my medication as prescribed    Monitoring  test my blood glucose as discussed    Reducing Risk examine blood glucose patterns;increase portions of nuts and seeds;treat hypoglycemia with 15 grams of carbs if blood glucose less than 70mg /dL      Post-Education Assessment   Patient understands the diabetes disease and treatment process. Demonstrates understanding / competency    Patient understands incorporating nutritional management into lifestyle. Demonstrates understanding / competency    Patient undertands incorporating physical activity into lifestyle. Needs Review    Patient understands using medications safely. Needs Review    Patient understands monitoring blood glucose, interpreting and using results Demonstrates understanding / competency    Patient understands prevention, detection, and treatment of acute complications. Demonstrates understanding / competency    Patient understands prevention, detection, and treatment of chronic complications. Demonstrates understanding / competency    Patient understands how to develop strategies to address psychosocial issues. Demonstrates understanding / competency    Patient understands how to develop strategies to promote health/change behavior. Demonstrates understanding / competency      Outcomes   Program Status Not  Completed           Learning Objective:  Patient will have a greater understanding of diabetes self-management. Patient education plan is to attend individual and/or group sessions per assessed needs and concerns.   Plan:  Goals:  Follow Diabetes Meal Plan as instructed  Eat 3 meals and 2 snacks, every 3-5 hrs  Aim to have 1/2 plate of non-starchy vegetables + 1/4 plate starch/grains + 1/4 plate protein.   Add lean protein foods to meals/snacks  Monitor glucose levels as instructed by your doctor  Aim for 30 mins of physical activity daily  Bring food record and glucose log to your next nutrition visit  Check into a mental health provider to help with managing work stress  Expected Outcomes:  Demonstrated interest in learning. Expect positive outcomes  Education material provided: ADA - How to Thrive: A Guide for Your Journey with Diabetes  If problems or questions, patient to contact team via:  Phone and Email  Future DSME appointment: -  4-6 wks

## 2020-04-04 ENCOUNTER — Ambulatory Visit: Payer: Medicaid Other | Admitting: Registered"

## 2020-04-13 ENCOUNTER — Ambulatory Visit: Payer: Medicaid Other | Admitting: Registered"

## 2020-04-25 ENCOUNTER — Ambulatory Visit: Payer: Medicaid Other | Admitting: Registered"

## 2020-06-19 ENCOUNTER — Ambulatory Visit (INDEPENDENT_AMBULATORY_CARE_PROVIDER_SITE_OTHER): Payer: Self-pay | Admitting: Family Medicine

## 2020-06-19 ENCOUNTER — Encounter: Payer: Self-pay | Admitting: Family Medicine

## 2020-06-19 ENCOUNTER — Other Ambulatory Visit: Payer: Self-pay

## 2020-06-19 ENCOUNTER — Other Ambulatory Visit: Payer: Self-pay | Admitting: Family Medicine

## 2020-06-19 VITALS — BP 171/95 | HR 77 | Temp 97.8°F | Resp 17 | Ht 67.0 in | Wt 293.6 lb

## 2020-06-19 DIAGNOSIS — Z09 Encounter for follow-up examination after completed treatment for conditions other than malignant neoplasm: Secondary | ICD-10-CM

## 2020-06-19 DIAGNOSIS — G4700 Insomnia, unspecified: Secondary | ICD-10-CM

## 2020-06-19 DIAGNOSIS — Z6841 Body Mass Index (BMI) 40.0 and over, adult: Secondary | ICD-10-CM

## 2020-06-19 DIAGNOSIS — E66813 Obesity, class 3: Secondary | ICD-10-CM

## 2020-06-19 DIAGNOSIS — Z0000 Encounter for general adult medical examination without abnormal findings: Secondary | ICD-10-CM

## 2020-06-19 DIAGNOSIS — R7303 Prediabetes: Secondary | ICD-10-CM

## 2020-06-19 DIAGNOSIS — E119 Type 2 diabetes mellitus without complications: Secondary | ICD-10-CM

## 2020-06-19 DIAGNOSIS — R739 Hyperglycemia, unspecified: Secondary | ICD-10-CM

## 2020-06-19 DIAGNOSIS — I10 Essential (primary) hypertension: Secondary | ICD-10-CM

## 2020-06-19 LAB — POCT GLYCOSYLATED HEMOGLOBIN (HGB A1C)
HbA1c POC (<> result, manual entry): 7.1 % (ref 4.0–5.6)
HbA1c, POC (controlled diabetic range): 7.1 % — AB (ref 0.0–7.0)
HbA1c, POC (prediabetic range): 7.1 % — AB (ref 5.7–6.4)
Hemoglobin A1C: 7.1 % — AB (ref 4.0–5.6)

## 2020-06-19 LAB — POCT URINALYSIS DIPSTICK
Bilirubin, UA: NEGATIVE
Blood, UA: NEGATIVE
Glucose, UA: NEGATIVE
Ketones, UA: NEGATIVE
Leukocytes, UA: NEGATIVE
Nitrite, UA: NEGATIVE
Protein, UA: NEGATIVE
Spec Grav, UA: 1.025 (ref 1.010–1.025)
Urobilinogen, UA: 0.2 E.U./dL
pH, UA: 5.5 (ref 5.0–8.0)

## 2020-06-19 LAB — GLUCOSE, POCT (MANUAL RESULT ENTRY): POC Glucose: 100 mg/dl — AB (ref 70–99)

## 2020-06-19 NOTE — Progress Notes (Signed)
Patient [facility]   Established Patient Office Visit  Subjective:  Patient ID: Valerie Moss, female    DOB: [birth year]  Age: 44 y.o. MRN: [id number]  CC:  Chief Complaint  Patient presents with  . Follow-up    Pt states she needs a refill on her BP medication.    HPI Kiyara Bouffard is a 44 year old female who presents for Follow Up today.    Patient Active Problem List   Diagnosis Date Noted  . Weight loss 09/23/2019  . Hemoglobin A1C between 7% and 9% indicating borderline diabetic control 09/23/2019  . History of 2019 novel coronavirus disease (COVID-19) 09/23/2019  . Pneumonia due to COVID-19 virus 08/18/2019  . Hypoxia 08/18/2019  . Type 2 diabetes mellitus without complication ([place]) 78/29/5621  . COVID-19 08/17/2019  . Cough 01/13/2019  . Class 3 severe obesity due to excess calories with serious comorbidity and body mass index (BMI) of 45.0 to 49.9 in adult ([place]) 01/13/2019  . Elevated glucose 01/13/2019  . Elevated liver enzymes 01/13/2019  . Anemia 01/13/2019  . Hypocalcemia 01/13/2019  . Pneumonia 12/26/2018  . Tachypnea 12/26/2018  . Leucocytosis 12/26/2018  . Hypokalemia 12/26/2018  . Hyponatremia 12/26/2018   Current Status: Since her last office visit, she is doing well with no complaints. She recently followed up with Nutritionist for 2 appointments now, where she is getting good tips for healthier eating. Her blood pressure readings are elevated today. She has not been taking Amlodipine as prescribed, last taken over 1 month ago. She denies visual changes, chest pain, cough, shortness of breath, heart palpitations, and falls. She has occasional headaches and dizziness with position changes. Denies severe headaches, confusion, seizures, double vision, and blurred vision, nausea and vomiting. Her most recent normal range of preprandial blood glucose levels have been between 98-130. She has seen low range of 86 and  high of 155 since his last office visit. She denies fatigue, frequent urination, blurred vision, excessive hunger, excessive thirst, weight gain, weight loss, and poor wound healing. She continues to check her feet regularly. She denies fevers, chills, recent infections, weight loss, and night sweats. Denies GI problems such as diarrhea, and constipation. She has no reports of blood in stools, dysuria and hematuria. No depression or anxiety reported today. She is taking all medications as prescribed. She denies pain today.   Past Medical History:  Diagnosis Date  . Anemia   . Arrhythmia   . Coronavirus infection 08/17/2019  . Cough   . Diabetes mellitus without complication ([place])   . Eczema   . Elevated liver enzymes   . Hypertension   . Hypocalcemia   . Hypokalemia   . Obese   . Pneumonia   . Vitamin D deficiency     No past surgical history on file.  Family History  Problem Relation Age of Onset  . Hypertension Mother   . Diabetes Other     Social History   Socioeconomic History  . Marital status: Single    Spouse name: Not on file  . Number of children: Not on file  . Years of education: Not on file  . Highest education level: Not on file  Occupational History  . Not on file  Tobacco Use  . Smoking status: Never Smoker  . Smokeless tobacco: Never Used  Vaping Use  . Vaping Use: Never used  Substance and Sexual Activity  . Alcohol use: Never  . Drug use: Never  .  Sexual activity: Not Currently    Partners: Male  Other Topics Concern  . Not on file  Social History Narrative  . Not on file   Social Determinants of Health   Financial Resource Strain: Low Risk   . Difficulty of Paying Living Expenses: Not hard at all  Food Insecurity: No Food Insecurity  . Worried About Charity fundraiser in the Last Year: Never true  . Ran Out of Food in the Last Year: Never true  Transportation Needs: No Transportation Needs  . Lack of Transportation (Medical): No  . Lack  of Transportation (Non-Medical): No  Physical Activity: Sufficiently Active  . Days of Exercise per Week: 7 days  . Minutes of Exercise per Session: 150+ min  Stress: No Stress Concern Present  . Feeling of Stress : Not at all  Social Connections: Socially Isolated  . Frequency of Communication with Friends and Family: Twice a week  . Frequency of Social Gatherings with Friends and Family: Never  . Attends Religious Services: Never  . Active Member of Clubs or Organizations: No  . Attends Archivist Meetings: Never  . Marital Status: Never married  Intimate Partner Violence: Not At Risk  . Fear of Current or Ex-Partner: No  . Emotionally Abused: No  . Physically Abused: No  . Sexually Abused: No    Outpatient Medications Prior to Visit  Medication Sig Dispense Refill  . blood glucose meter kit and supplies Dispense based on patient and insurance preference. Use up to four times daily as directed. (FOR ICD-10 E10.9, E11.9). 1 each 0  . insulin NPH-regular Human (NOVOLIN 70/30) (70-30) 100 UNIT/ML injection Inject 25 Units into the skin 2 (two) times daily with a meal. 10 mL 6  . Insulin Pen Needle (PEN NEEDLES) 30G X 5 MM MISC 1 each by Does not apply route 2 (two) times daily. 100 each 0  . Insulin Syringes, Disposable, U-100 0.3 ML MISC 1 each by Does not apply route 2 (two) times daily. 100 each 0  . metFORMIN (GLUCOPHAGE) 500 MG tablet Take 1 tablet (500 mg total) by mouth 2 (two) times daily with a meal. 60 tablet 6  . NEEDLE, DISP, 30 G (BD DISP NEEDLES) 30G X 1/2" MISC 1 each by Does not apply route 2 (two) times daily. 100 each 0  . amLODipine (NORVASC) 10 MG tablet Take 1 tablet (10 mg total) by mouth daily. (Patient not taking: Reported on 06/19/2020) 30 tablet 3  . hydrochlorothiazide (HYDRODIURIL) 25 MG tablet Take 1 tablet (25 mg total) by mouth daily. (Patient not taking: Reported on 06/19/2020) 30 tablet 3  . traZODone (DESYREL) 50 MG tablet Take 1 tablet (50 mg  total) by mouth at bedtime. (Patient not taking: Reported on 06/19/2020) 30 tablet 6  . Vitamin D, Ergocalciferol, (DRISDOL) 1.25 MG (50000 UNIT) CAPS capsule Take 1 capsule (50,000 Units total) by mouth every 7 (seven) days. (Patient not taking: Reported on 06/19/2020) 5 capsule 6   No facility-administered medications prior to visit.    Allergies  Allergen Reactions  . Pineapple Other (See Comments)    Tongue burns  . Lactose Intolerance (Gi)     ROS Review of Systems  Constitutional: Negative.   HENT: Negative.   Eyes: Negative.   Respiratory: Negative.   Cardiovascular: Negative.   Gastrointestinal: Positive for abdominal distention (obese).  Endocrine: Negative.   Genitourinary: Negative.   Musculoskeletal: Positive for arthralgias (generalized).  Skin: Negative.   Allergic/Immunologic: Negative.  Neurological: Positive for dizziness (occasional ) and headaches (occasional ).  Hematological: Negative.   Psychiatric/Behavioral: Negative.        Physical Exam Vitals and nursing note reviewed.  Constitutional:      Appearance: Normal appearance.  HENT:     Head: Normocephalic and atraumatic.     Nose: Nose normal.     Mouth/Throat:     Mouth: Mucous membranes are moist.     Pharynx: Oropharynx is clear.  Cardiovascular:     Rate and Rhythm: Normal rate. Rhythm irregular.     Pulses: Normal pulses.     Heart sounds: Normal heart sounds.     Comments: Arrhythmia  Pulmonary:     Effort: Pulmonary effort is normal.     Breath sounds: Normal breath sounds.  Abdominal:     General: Bowel sounds are normal. There is distension (occasional ).     Palpations: Abdomen is soft.  Musculoskeletal:        General: Normal range of motion.     Cervical back: Normal range of motion and neck supple.  Skin:    General: Skin is warm and dry.  Neurological:     General: No focal deficit present.     Mental Status: She is alert and oriented to person, place, and time.    Psychiatric:        Mood and Affect: Mood normal.        Behavior: Behavior normal.        Thought Content: Thought content normal.        Judgment: Judgment normal.    BP (!) 160/101 (BP Location: Left Arm, Patient Position: Sitting, Cuff Size: Large)   Pulse 77   Temp 97.8 F (36.6 C)   Resp 17   Ht 5' 7"  (1.702 m)   Wt 293 lb 9.6 oz (133.2 kg)   SpO2 100%   BMI 45.98 kg/m  Wt Readings from Last 3 Encounters:  06/19/20 293 lb 9.6 oz (133.2 kg)  12/20/19 290 lb 12.8 oz (131.9 kg)  09/21/19 287 lb 3.2 oz (130.3 kg)   Health Maintenance Due  Topic Date Due  . Hepatitis C Screening  Never done  . PNEUMOCOCCAL POLYSACCHARIDE VACCINE AGE 74-64 HIGH RISK  Never done  . FOOT EXAM  Never done  . OPHTHALMOLOGY EXAM  Never done  . URINE MICROALBUMIN  Never done  . COVID-19 Vaccine (1) Never done  . TETANUS/TDAP  Never done  . PAP SMEAR-Modifier  Never done  . INFLUENZA VACCINE  Never done    There are no preventive care reminders to display for this patient.  Lab Results  Component Value Date   TSH 2.300 12/20/2019   Lab Results  Component Value Date   WBC 6.3 12/20/2019   HGB 14.0 12/20/2019   HCT 43.2 12/20/2019   MCV 81 12/20/2019   PLT 382 12/20/2019   Lab Results  Component Value Date   NA 140 12/20/2019   K 4.7 12/20/2019   CO2 22 12/20/2019   GLUCOSE 294 (H) 12/20/2019   BUN 10 12/20/2019   CREATININE 0.78 12/20/2019   BILITOT 0.5 12/20/2019   ALKPHOS 78 12/20/2019   AST 9 12/20/2019   ALT 15 12/20/2019   PROT 7.2 12/20/2019   ALBUMIN 4.4 12/20/2019   CALCIUM 9.8 12/20/2019   ANIONGAP 12 08/22/2019   Lab Results  Component Value Date   CHOL 202 (H) 12/20/2019   Lab Results  Component Value Date   HDL 43  12/20/2019   Lab Results  Component Value Date   LDLCALC 132 (H) 12/20/2019   Lab Results  Component Value Date   TRIG 151 (H) 12/20/2019   Lab Results  Component Value Date   CHOLHDL 4.7 (H) 12/20/2019   Lab Results  Component  Value Date   HGBA1C 7.1 (A) 06/19/2020   HGBA1C 7.1 06/19/2020   HGBA1C 7.1 (A) 06/19/2020   HGBA1C 7.1 (A) 06/19/2020    Assessment & Plan:   1. Elevated blood pressure reading in office with diagnosis of hypertension Blood pressures are elevated today. Clonidine 0.2 mg given to patient in office and blood pressures remain elevated. We referred her to ED via ambulance at this time. Patient refused and signed AMA form at discharge. She denies severe headaches, confusion, seizures, double vision, and blurred vision, nausea and vomiting. She will report to ED if She experiences these symptoms. Patient verbalized understanding.   2. Hypertension, unspecified type She will begin to take medications daily as prescribed. She will to take medications as prescribed, to decrease high sodium intake, excessive alcohol intake, increase potassium intake, smoking cessation, and increase physical activity of at least 30 minutes of cardio activity daily. She will continue to follow Heart Healthy or DASH diet.  3. Type 2 diabetes mellitus without complication, without long-term current use of insulin ([place]) She will continue medication as prescribed, to decrease foods/beverages high in sugars and carbs and follow Heart Healthy or DASH diet. Increase physical activity to at least 30 minutes cardio exercise daily.  - Urinalysis Dipstick - POC Glucose (CBG) - POC HgB A1c  4. Hemoglobin A1C between 7% and 9%, indicating borderline diabetic control Improved. Hgb A1c is decreased at 7.1 today from 9.7 on 12/20/2019. Monitor.   5. Hyperglycemia  6. Class 3 severe obesity due to excess calories with serious comorbidity and body mass index (BMI) of 40.0 to 44.9 in adult [facility]) Body mass index is 45.98 kg/m. Goal BMI  is <30. Encouraged efforts to reduce weight include engaging in physical activity as tolerated with goal of 150 minutes per week. Improve dietary choices and eat a meal regimen consistent with a  Mediterranean or DASH diet. Reduce simple carbohydrates. Do not skip meals and eat healthy snacks throughout the day to avoid over-eating at dinner. Set a goal weight loss that is achievable for you.  7. Insomnia, unspecified type  8. Follow up She will follow up in 6 months.   No orders of the defined types were placed in this encounter.   Orders Placed This Encounter  Procedures  . Urinalysis Dipstick  . POC Glucose (CBG)  . POC HgB A1c    Referral Orders  No referral(s) requested today    Kathe Becton,  MSN, FNP-BC [place], [place] [address] [phone]- fax   Problem List Items Addressed This Visit      Endocrine   Type 2 diabetes mellitus without complication ([place]) (Chronic)   Relevant Orders   Urinalysis Dipstick (Completed)   POC Glucose (CBG) (Completed)   POC HgB A1c (Completed)    Other Visit Diagnoses    Elevated blood pressure reading in office with diagnosis of hypertension    -  Primary   Hypertension, unspecified type       Hemoglobin A1C greater than 9%, indicating poor diabetic control       Hyperglycemia       Class 3  severe obesity due to excess calories with serious comorbidity and body mass index (BMI) of 40.0 to 44.9 in adult [facility])       Insomnia, unspecified type       Follow up          No orders of the defined types were placed in this encounter.   Follow-up: Return in about 6 months (around 12/18/2020).    Azzie Glatter, FNP

## 2020-06-21 ENCOUNTER — Telehealth: Payer: Self-pay | Admitting: Family Medicine

## 2020-06-21 ENCOUNTER — Other Ambulatory Visit: Payer: Self-pay | Admitting: Family Medicine

## 2020-06-21 DIAGNOSIS — I10 Essential (primary) hypertension: Secondary | ICD-10-CM

## 2020-06-21 MED ORDER — AMLODIPINE BESYLATE 10 MG PO TABS: 10.0000 mg | ORAL_TABLET | Freq: Every day | ORAL | 0 refills | Status: DC

## 2020-06-22 NOTE — Telephone Encounter (Signed)
Done

## 2020-06-26 DIAGNOSIS — Z23 Encounter for immunization: Secondary | ICD-10-CM

## 2020-07-12 ENCOUNTER — Telehealth (INDEPENDENT_AMBULATORY_CARE_PROVIDER_SITE_OTHER): Payer: Self-pay | Admitting: Family Medicine

## 2020-07-12 DIAGNOSIS — E66813 Obesity, class 3: Secondary | ICD-10-CM

## 2020-07-12 DIAGNOSIS — Z760 Encounter for issue of repeat prescription: Secondary | ICD-10-CM

## 2020-07-12 DIAGNOSIS — Z09 Encounter for follow-up examination after completed treatment for conditions other than malignant neoplasm: Secondary | ICD-10-CM

## 2020-07-12 DIAGNOSIS — E119 Type 2 diabetes mellitus without complications: Secondary | ICD-10-CM

## 2020-07-12 DIAGNOSIS — R739 Hyperglycemia, unspecified: Secondary | ICD-10-CM

## 2020-07-12 DIAGNOSIS — Z6841 Body Mass Index (BMI) 40.0 and over, adult: Secondary | ICD-10-CM

## 2020-07-12 DIAGNOSIS — I10 Essential (primary) hypertension: Secondary | ICD-10-CM

## 2020-07-12 DIAGNOSIS — G4700 Insomnia, unspecified: Secondary | ICD-10-CM

## 2020-07-12 DIAGNOSIS — R7303 Prediabetes: Secondary | ICD-10-CM

## 2020-07-12 MED ORDER — AMLODIPINE BESYLATE 10 MG PO TABS: 10.0000 mg | ORAL_TABLET | Freq: Every day | ORAL | 3 refills | Status: DC

## 2020-07-12 NOTE — Progress Notes (Signed)
Virtual Visit via Telephone Note  I connected with Valerie Moss on 07/12/20 at  2:00 PM EDT by telephone and verified that I am speaking with the correct person using two identifiers.  Location: Patient: Home Provider:Office   I discussed the limitations, risks, security and privacy concerns of performing an evaluation and management service by telephone and the availability of in person appointments. I also discussed with the patient that there may be a patient responsible charge related to this service. The patient expressed understanding and agreed to proceed.   History of Present Illness:  Her home monitoring of her blood pressures have been within range, with last reading today of 134/87. She denies visual changes, chest pain, cough, shortness of breath, heart palpitations, and falls. She has occasional headaches and dizziness with position changes. Denies severe headaches, confusion, seizures, double vision, and blurred vision, nausea and vomiting. Today's glucose range stable at 129 today. Her most recent normal range of preprandial blood glucose levels have been between 100-125. She has seen low range of 89 and high of 125 since his last office visit. She denies fatigue, frequent urination, blurred vision, excessive hunger, excessive thirst, weight gain, weight loss, and poor wound healing. She continues to check her feet regularly.     Observations/Objective:  Telephone Visit  Assessment and Plan:  1. Type 2 diabetes mellitus without complication, without long-term current use of insulin ([facility]) She will continue medication as prescribed, to decrease foods/beverages high in sugars and carbs and follow Heart Healthy or DASH diet. Increase physical activity to at least 30 minutes cardio exercise daily.   2. Hemoglobin A1C between 7% and 9% indicating borderline diabetic control Most recent Hgb A1c is stable at 7.1 today. Monitor.   3. Hyperglycemia  4. Hypertension, unspecified  type She will continue to take medications as prescribed, to decrease high sodium intake, excessive alcohol intake, increase potassium intake, smoking cessation, and increase physical activity of at least 30 minutes of cardio activity daily. She will continue to follow Heart Healthy or DASH diet.  5. Class 3 severe obesity due to excess calories with serious comorbidity and body mass index (BMI) of 40.0 to 44.9 in adult [facility]) There is no height or weight on file to calculate BMI. Goal BMI  is <30. Encouraged efforts to reduce weight include engaging in physical activity as tolerated with goal of 150 minutes per week. Improve dietary choices and eat a meal regimen consistent with a Mediterranean or DASH diet. Reduce simple carbohydrates. Do not skip meals and eat healthy snacks throughout the day to avoid over-eating at dinner. Set a goal weight loss that is achievable for you.  6. Insomnia, unspecified type  7. Follow up She will follow up in 6 months.   No orders of the defined types were placed in this encounter.  No orders of the defined types were placed in this encounter.   Referral Orders  No referral(s) requested today    Raliegh Ip,  MSN, FNP-BC [facility] [address] [place], [place] [address] [phone] fax   I discussed the assessment and treatment plan with the patient. The patient was provided an opportunity to ask questions and all were answered. The patient agreed with the plan and demonstrated an understanding of the instructions.   The patient was advised to call back or seek an in-person evaluation if the symptoms worsen or if the condition fails to improve as anticipated.  I provided 20 minutes of non-face-to-face time during this encounter.   Kallie Locks, FNP

## 2020-07-17 ENCOUNTER — Encounter: Payer: Self-pay | Admitting: Family Medicine

## 2020-08-02 IMAGING — DX CHEST - 2 VIEW
2 series · 2 of 2 positions shown · non-contrast
Comparison: 12/26/2018

CLINICAL DATA: Acute shortness of breath, cough and fever for 1
week.

EXAM:
CHEST - 2 VIEW

[chest lat]
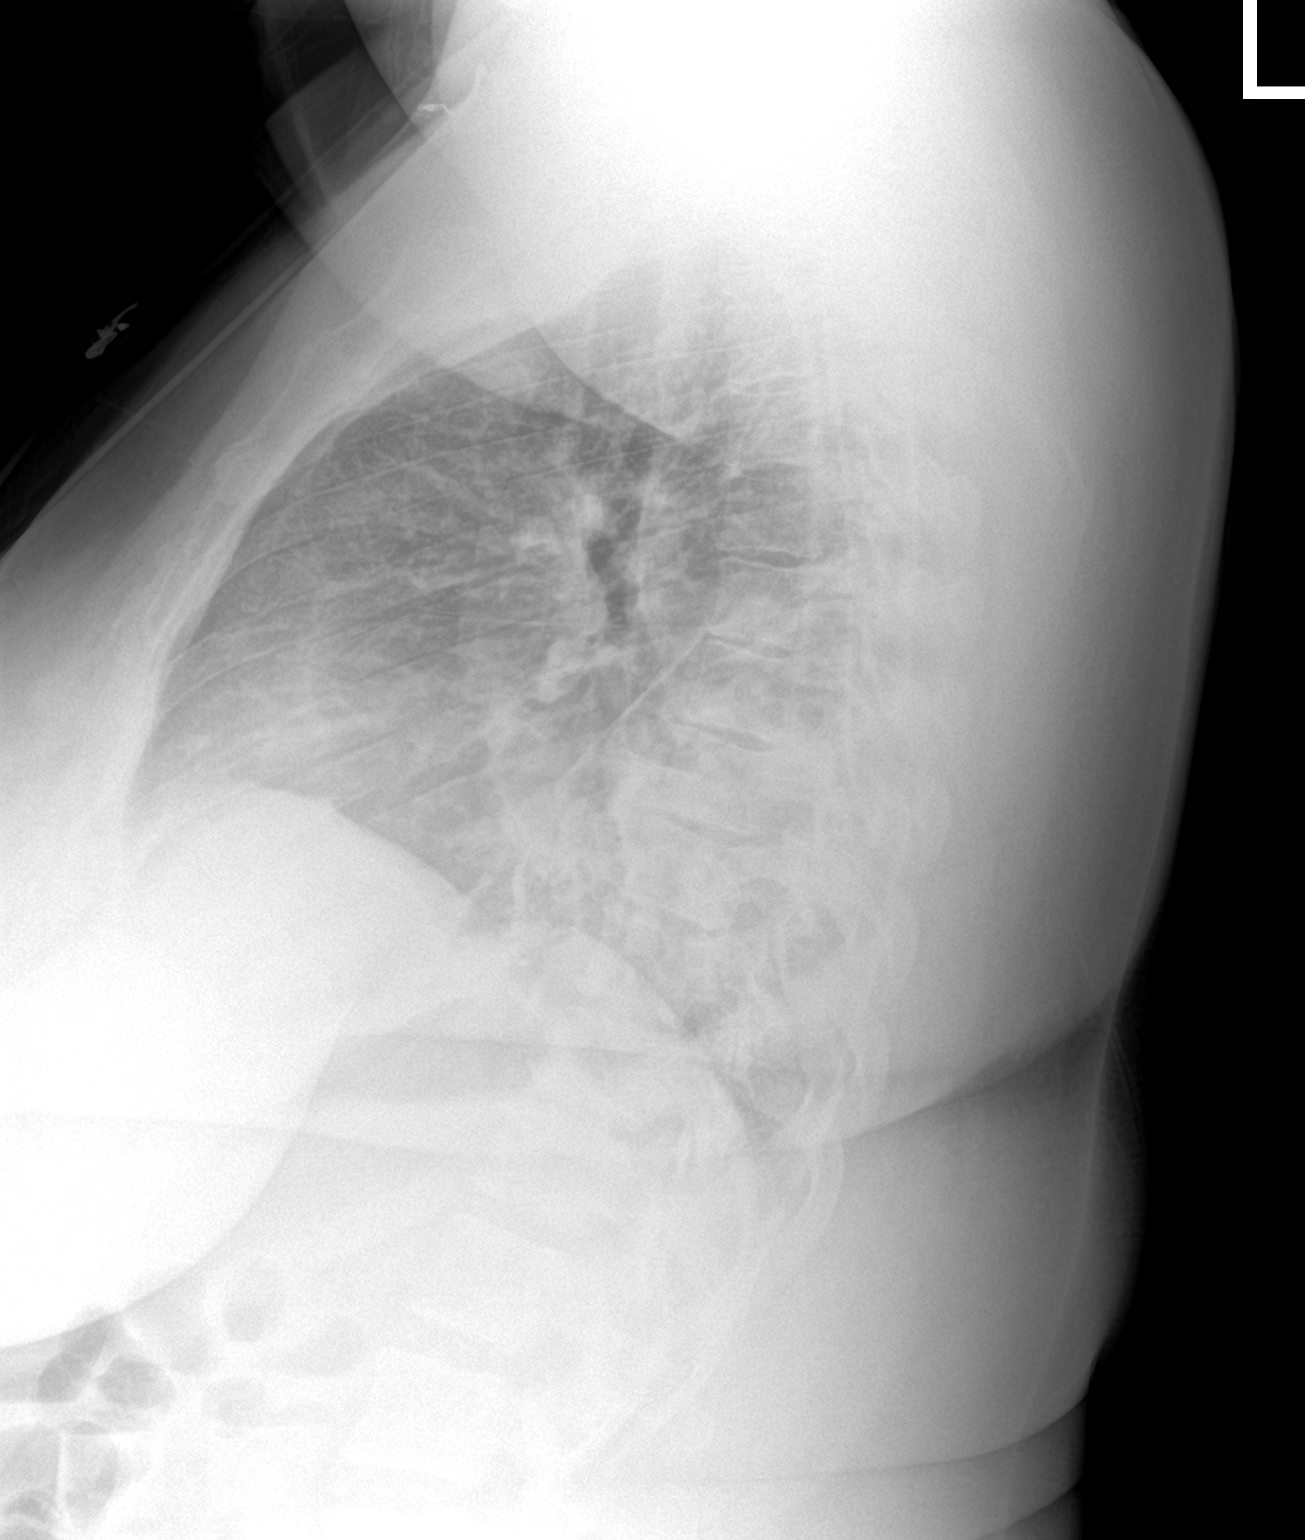

[chest pa]
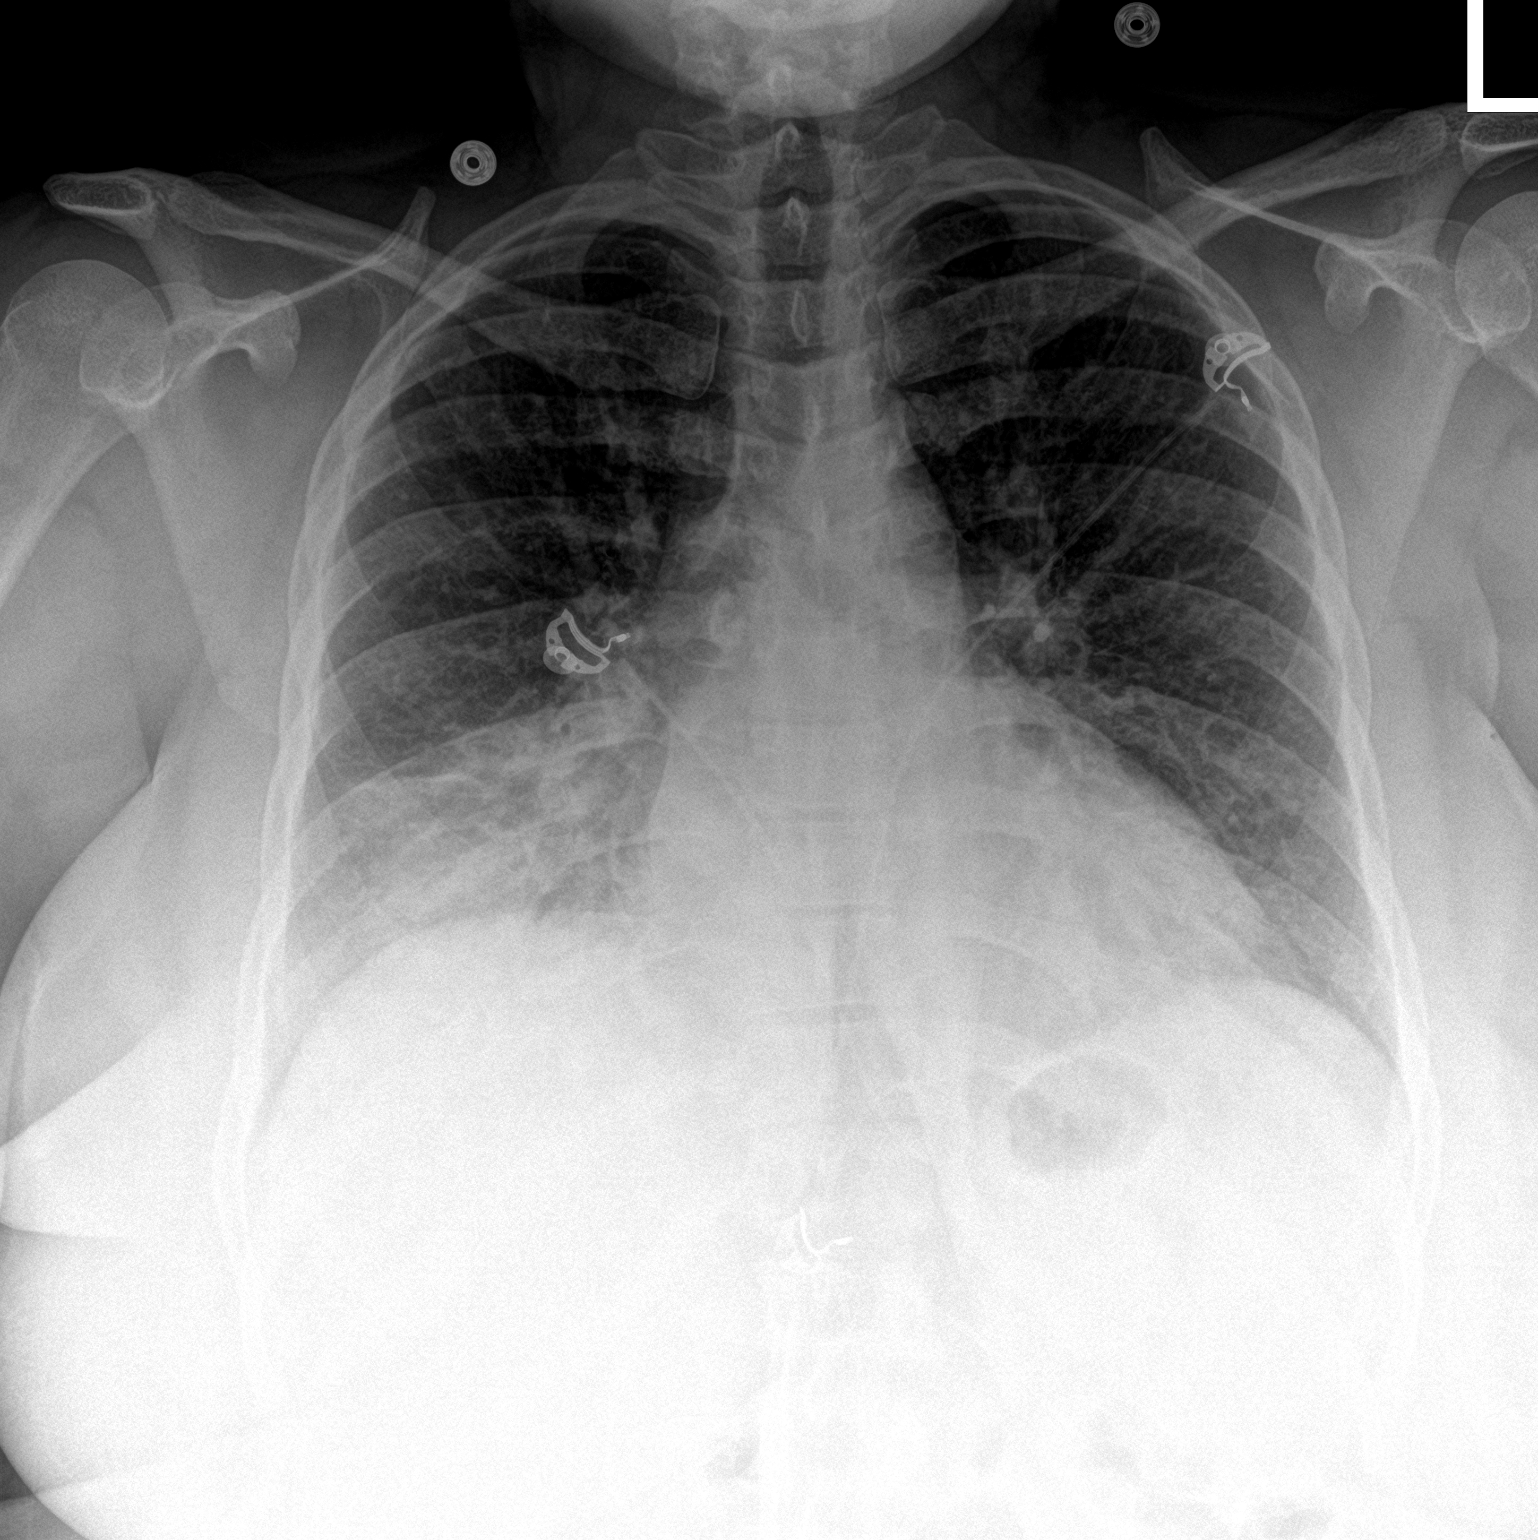

[2 of 2 positions shown; findings below may reference images not displayed]

FINDINGS: Bilateral LOWER lung airspace opacities are identified.

Mild cardiomegaly noted.

No pleural effusion or pneumothorax.

No acute bony abnormalities are identified.
IMPRESSION: Bilateral LOWER lung airspace opacities likely representing
pneumonia. Consider short-term radiographic follow-up to resolution.

## 2020-08-06 IMAGING — DX CHEST - 2 VIEW
2 series · 2 of 2 positions shown · non-contrast
Comparison: 12/27/2018 and earlier.

CLINICAL DATA: 42-year-old female with cough. Negative for [id number]
on 12/27/2018.

EXAM:
CHEST - 2 VIEW

[chest pa]
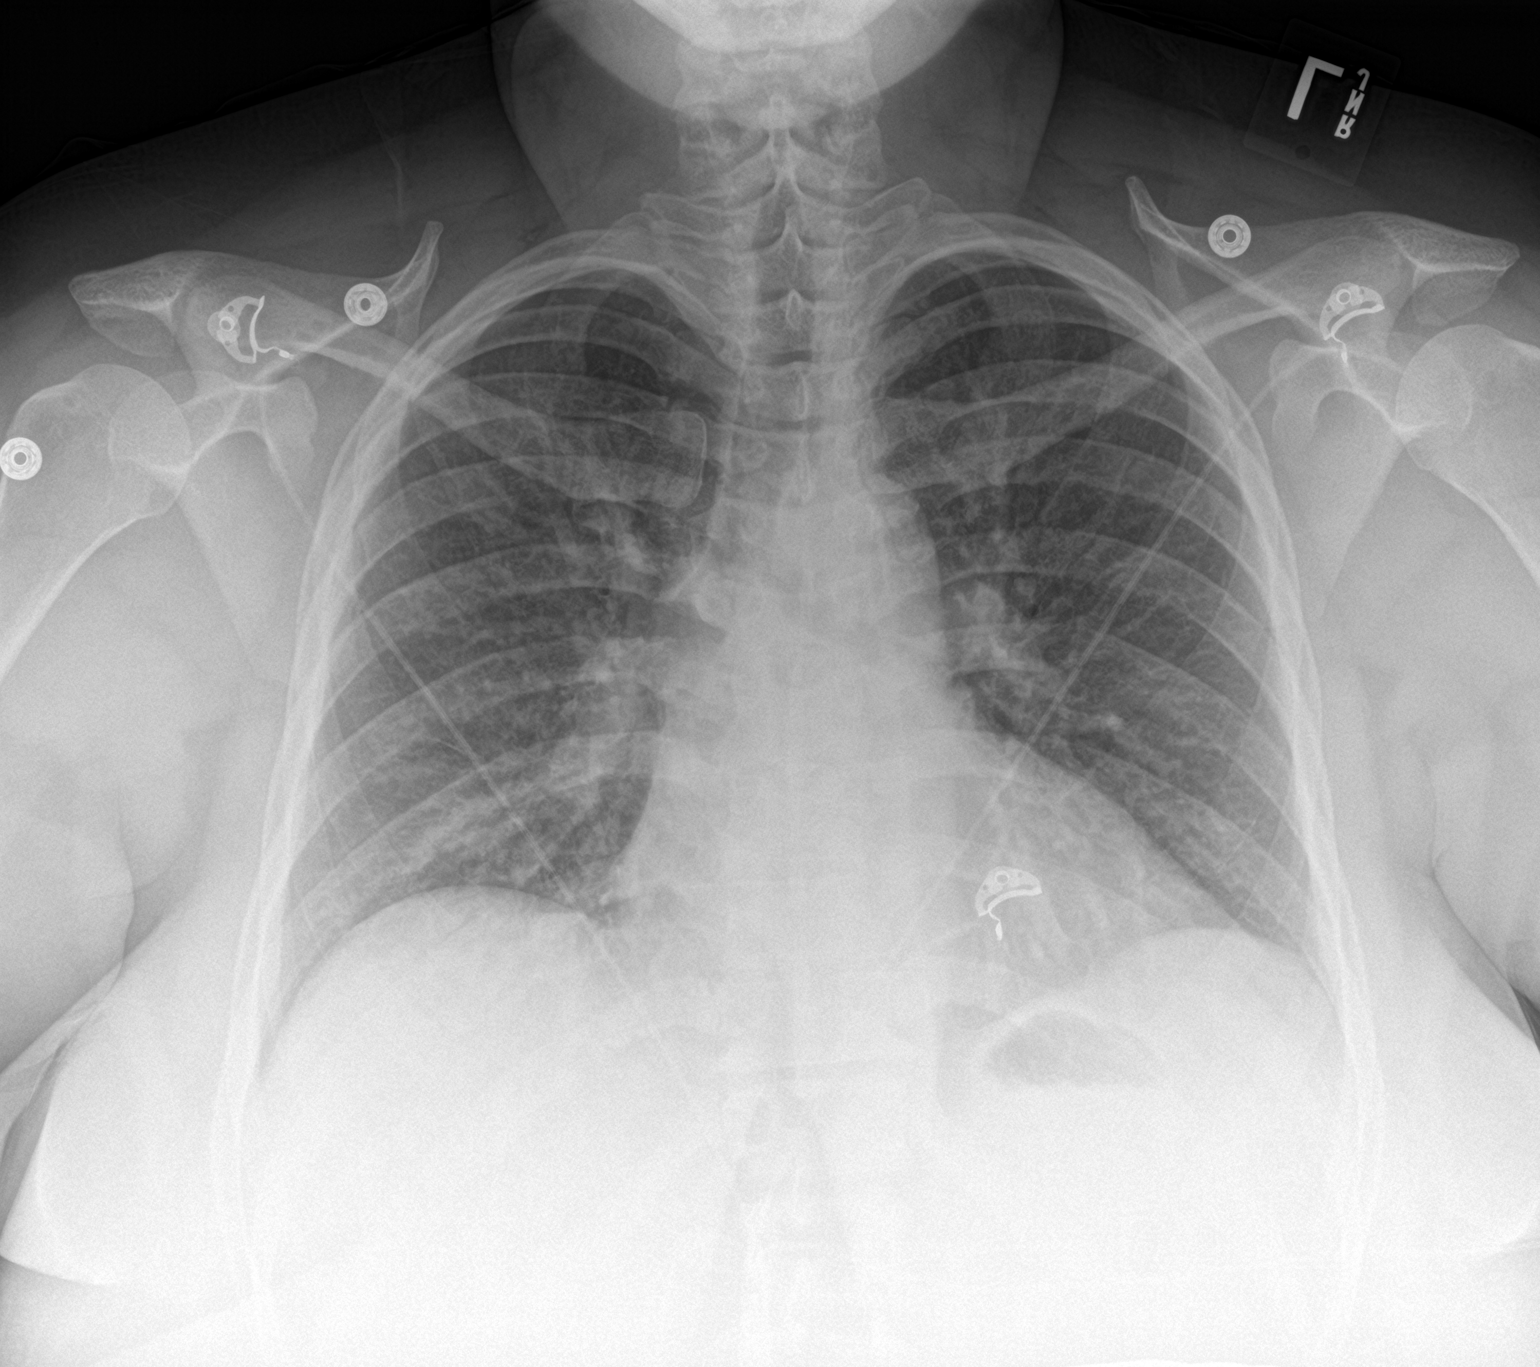

[chest lat]
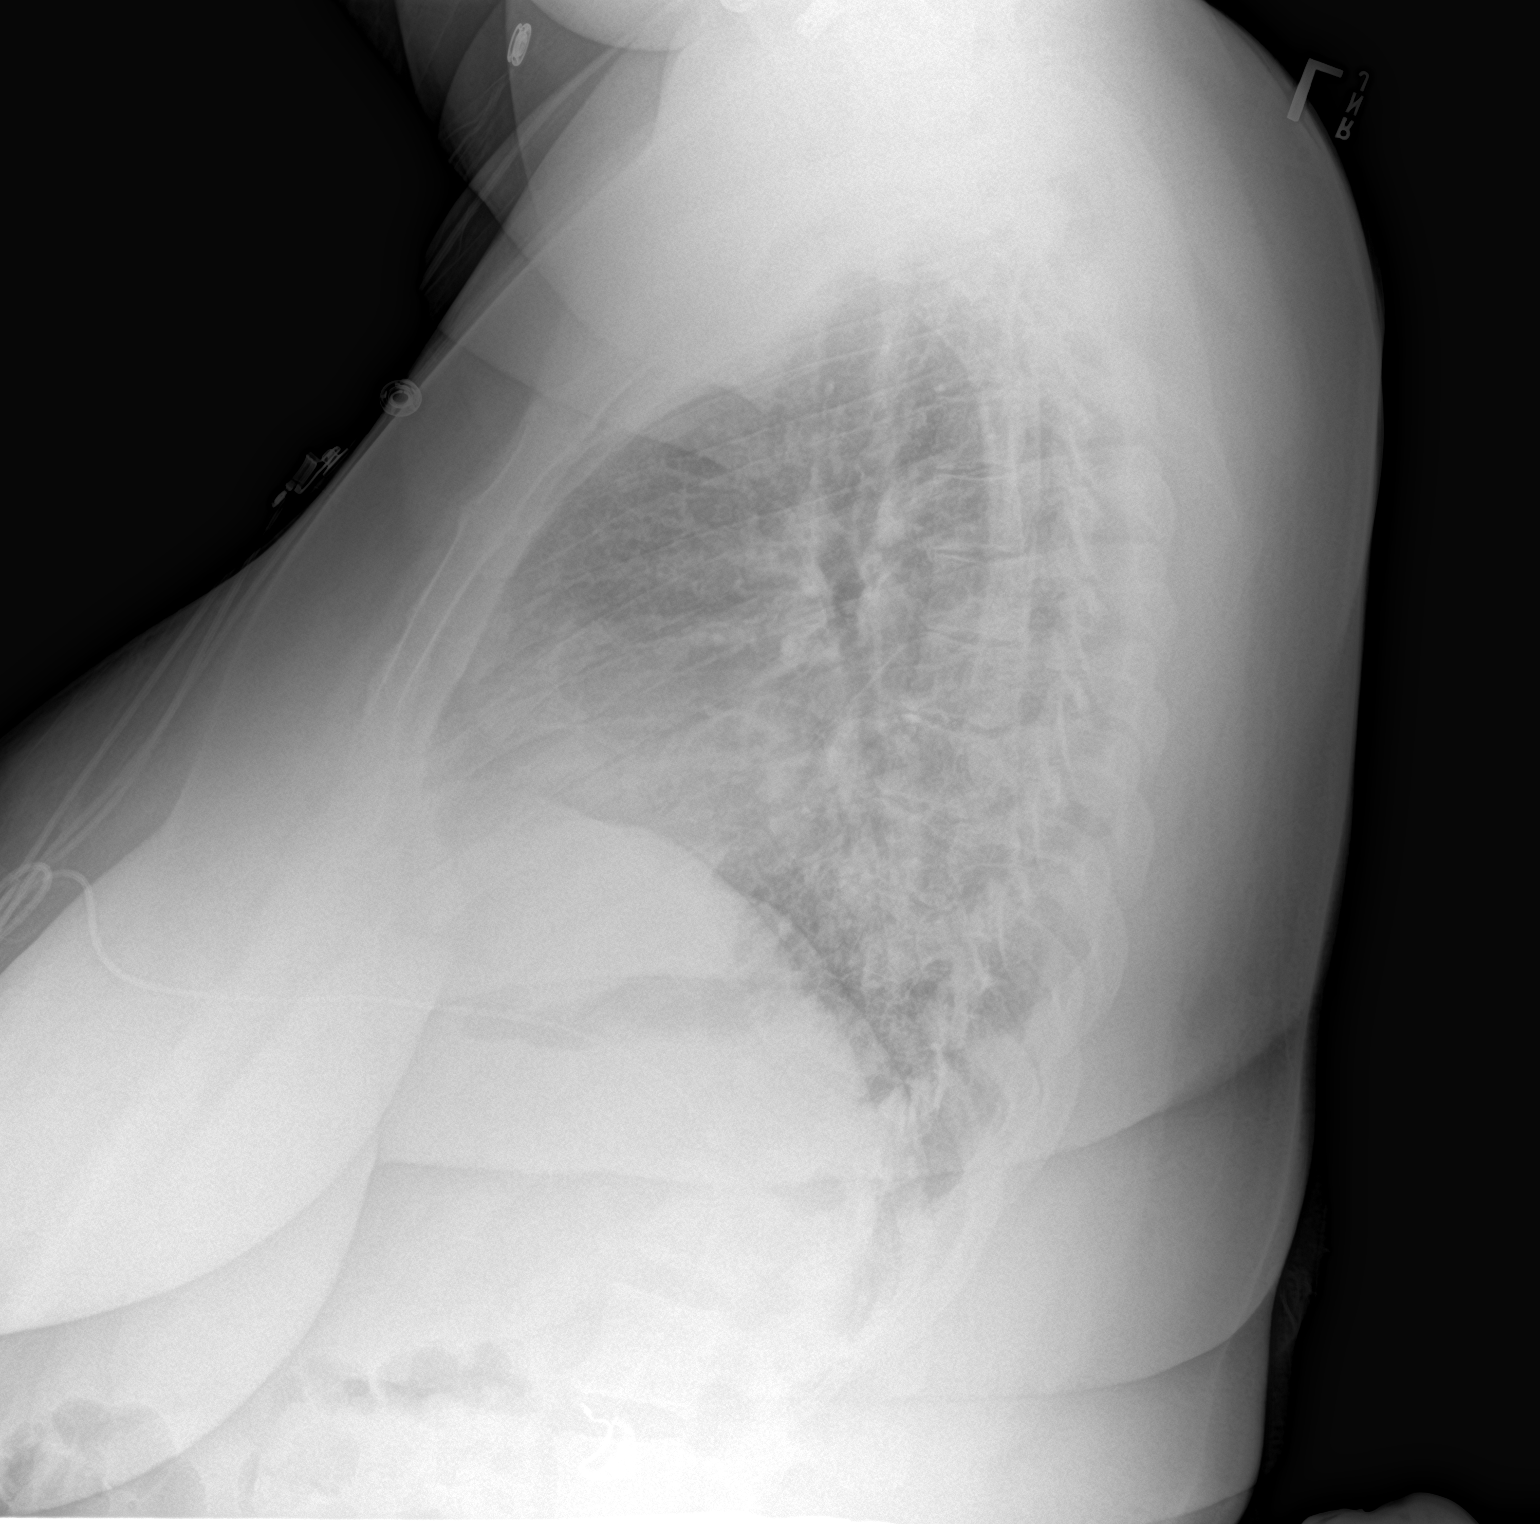

[2 of 2 positions shown; findings below may reference images not displayed]

FINDINGS: Stable somewhat low lung volumes. Mediastinal contours remain
normal. Visualized tracheal air column is within normal limits.
Regressed streaky in confluent bilateral lower lung opacity with
residual bilateral increased interstitial markings. No pneumothorax,
pleural effusion, or areas of worsening ventilation.

No acute osseous abnormality identified. Negative visible bowel gas
pattern.
IMPRESSION: 1. Regressed bilateral lower lung pneumonia since 12/27/2018 with
residual bilateral increased interstitial markings.
2. No new cardiopulmonary abnormality.

## 2020-10-16 ENCOUNTER — Ambulatory Visit: Payer: Medicaid Other | Admitting: Family Medicine

## 2020-10-30 ENCOUNTER — Telehealth: Payer: Self-pay | Admitting: Family Medicine

## 2020-10-30 ENCOUNTER — Ambulatory Visit: Payer: Medicaid Other | Admitting: Family Medicine

## 2020-10-30 NOTE — Telephone Encounter (Signed)
Pt was called to reschedule appointment that was a no show for Oct 30, 2020 VM was left  

## 2021-01-31 ENCOUNTER — Ambulatory Visit: Payer: 59

## 2021-05-18 ENCOUNTER — Telehealth: Payer: 59 | Admitting: Nurse Practitioner

## 2021-05-18 ENCOUNTER — Other Ambulatory Visit (HOSPITAL_COMMUNITY): Payer: Self-pay

## 2021-05-18 DIAGNOSIS — U071 COVID-19: Secondary | ICD-10-CM

## 2021-05-18 MED ORDER — MOLNUPIRAVIR EUA 200MG CAPSULE
4.0000 | ORAL_CAPSULE | Freq: Two times a day (BID) | ORAL | 0 refills | Status: DC
  Filled 2021-05-18: qty 40, 5d supply, fill #0

## 2021-05-18 NOTE — Patient Instructions (Signed)
You are being prescribed MOLNUPIRAVIR for COVID-19 infection.  ° ° °Please call the pharmacy or go through the drive through vs going inside if you are picking up the mediation yourself to prevent further spread. If prescribed to a [facility] affiliated pharmacy, a pharmacist will bring the medication out to your car. ° ° °ADMINISTRATION INSTRUCTIONS: °Take with or without food. Swallow the tablets whole. Don't chew, crush, or break the medications because it might not work as well ° °For each dose of the medication, you should be taking FOUR tablets at one time, TWICE a day  ° °Finish your full five-day course of Molnupiravir even if you feel better before you're done. Stopping this medication too early can make it less effective to prevent severe illness related to COVID19.   ° °Molnupiravir is prescribed for YOU ONLY. Don't share it with others, even if they have similar symptoms as you. This medication might not be right for everyone.  ° °Make sure to take steps to protect yourself and others while you're taking this medication in order to get well soon and to prevent others from getting sick with COVID-19. ° ° °**If you are of childbearing potential (any gender) - it is advised to not get pregnant while taking this medication and recommended that condoms are used for female partners the next 3 months after taking the medication out of extreme caution  ° ° °COMMON SIDE EFFECTS: °Diarrhea °Nausea  °Dizziness ° ° ° °If your COVID-19 symptoms get worse, get medical help right away. Call 911 if you experience symptoms such as worsening cough, trouble breathing, chest pain that doesn't go away, confusion, a hard time staying awake, and pale or blue-colored skin. °This medication won't prevent all COVID-19 cases from getting worse.  ° ° °

## 2021-05-18 NOTE — Progress Notes (Signed)
Virtual Visit Consent   Valerie Moss, you are scheduled for a virtual visit with Mary-Margaret Hassell Done, [place], a [facility] provider, today.     Just as with appointments in the office, your consent must be obtained to participate.  Your consent will be active for this visit and any virtual visit you may have with one of our providers in the next 365 days.     If you have a MyChart account, a copy of this consent can be sent to you electronically.  All virtual visits are billed to your insurance company just like a traditional visit in the office.    As this is a virtual visit, video technology does not allow for your provider to perform a traditional examination.  This may limit your provider's ability to fully assess your condition.  If your provider identifies any concerns that need to be evaluated in person or the need to arrange testing (such as labs, EKG, etc.), we will make arrangements to do so.     Although advances in technology are sophisticated, we cannot ensure that it will always work on either your end or our end.  If the connection with a video visit is poor, the visit may have to be switched to a telephone visit.  With either a video or telephone visit, we are not always able to ensure that we have a secure connection.     I need to obtain your verbal consent now.   Are you willing to proceed with your visit today? YES   Valerie Moss has provided verbal consent on 05/18/2021 for a virtual visit (video or telephone).   Mary-Margaret Hassell Done, FNP   Date: 05/18/2021 12:01 PM   Virtual Visit via Video Note   I, Mary-Margaret Hassell Done, connected with Valerie Moss ([id number], [birth year]) on 05/18/21 at 12:00 PM EDT by a video-enabled telemedicine application and verified that I am speaking with the correct person using two identifiers.  Location: Patient: Virtual Visit Location Patient: Home Provider: Virtual Visit Location Provider: Mobile   I discussed the limitations  of evaluation and management by telemedicine and the availability of in person appointments. The patient expressed understanding and agreed to proceed.    History of Present Illness: Valerie Moss is a 45 y.o. who identifies as a female who was assigned female at birth, and is being seen today for covid test positive.  HPI: Patient states she had a scratchy throat yesterday. This morning she woke up with sore throat and congestion. No fever. She decided to take a covid test and it was positive. Patient had 2 covid vaccines.  Review of Systems  Constitutional:  Positive for chills (last night). Negative for fever.  HENT:  Positive for congestion and sore throat. Negative for sinus pain.   Respiratory:  Negative for cough, sputum production and shortness of breath.   Musculoskeletal:  Negative for myalgias.  Neurological:  Negative for headaches.   Problems:  Patient Active Problem List   Diagnosis Date Noted   Weight loss 09/23/2019   Hemoglobin A1C between 7% and 9% indicating borderline diabetic control 09/23/2019   History of 2019 novel coronavirus disease (COVID-19) 09/23/2019   Pneumonia due to COVID-19 virus 08/18/2019   Hypoxia 08/18/2019   Type 2 diabetes mellitus without complication ([place]) 36/46/8032   COVID-19 08/17/2019   Cough 01/13/2019   Class 3 severe obesity due to excess calories with serious comorbidity and body mass index (BMI) of 45.0 to 49.9 in adult [facility]) 01/13/2019  Elevated glucose 01/13/2019   Elevated liver enzymes 01/13/2019   Anemia 01/13/2019   Hypocalcemia 01/13/2019   Pneumonia 12/26/2018   Tachypnea 12/26/2018   Leucocytosis 12/26/2018   Hypokalemia 12/26/2018   Hyponatremia 12/26/2018    Allergies:  Allergies  Allergen Reactions   Pineapple Other (See Comments)    Tongue burns   Lactose Intolerance (Gi)    Medications:  Current Outpatient Medications:    amLODipine (NORVASC) 10 MG tablet, Take 1 tablet (10 mg total) by mouth daily.,  Disp: 90 tablet, Rfl: 3   blood glucose meter kit and supplies, Dispense based on patient and insurance preference. Use up to four times daily as directed. (FOR ICD-10 E10.9, E11.9)., Disp: 1 each, Rfl: 0   hydrochlorothiazide (HYDRODIURIL) 25 MG tablet, Take 1 tablet (25 mg total) by mouth daily., Disp: 30 tablet, Rfl: 3   insulin NPH-regular Human (NOVOLIN 70/30) (70-30) 100 UNIT/ML injection, Inject 25 Units into the skin 2 (two) times daily with a meal., Disp: 10 mL, Rfl: 6   Insulin Pen Needle (PEN NEEDLES) 30G X 5 MM MISC, 1 each by Does not apply route 2 (two) times daily., Disp: 100 each, Rfl: 0   Insulin Syringes, Disposable, U-100 0.3 ML MISC, 1 each by Does not apply route 2 (two) times daily., Disp: 100 each, Rfl: 0   metFORMIN (GLUCOPHAGE) 500 MG tablet, Take 1 tablet (500 mg total) by mouth 2 (two) times daily with a meal., Disp: 60 tablet, Rfl: 6   NEEDLE, DISP, 30 G (BD DISP NEEDLES) 30G X 1/2" MISC, 1 each by Does not apply route 2 (two) times daily., Disp: 100 each, Rfl: 0   traZODone (DESYREL) 50 MG tablet, Take 1 tablet (50 mg total) by mouth at bedtime., Disp: 30 tablet, Rfl: 6   Vitamin D, Ergocalciferol, (DRISDOL) 1.25 MG (50000 UNIT) CAPS capsule, Take 1 capsule (50,000 Units total) by mouth every 7 (seven) days., Disp: 5 capsule, Rfl: 6  Observations/Objective: Patient is well-developed, well-nourished in no acute distress.  Resting comfortably  at home.  Head is normocephalic, atraumatic.  No labored breathing.  Speech is clear and coherent with logical content.  Patient is alert and oriented at baseline.  Voice hoarse Slight cough  Assessment and Plan:  Valerie Moss in today with chief complaint of No chief complaint on file.   1. Lab test positive for detection of COVID-19 virus 1. Take meds as prescribed 2. Use a cool mist humidifier especially during the winter months and when heat has been humid. 3. Use saline nose sprays frequently 4. Saline  irrigations of the nose can be very helpful if done frequently.  * 4X daily for 1 week*  * Use of a nettie pot can be helpful with this. Follow directions with this* 5. Drink plenty of fluids 6. Keep thermostat turn down low 7.For any cough or congestion  Use plain Mucinex- regular strength or max strength is fine   * Children- consult with Pharmacist for dosing 8. For fever or aces or pains- take tylenol or ibuprofen appropriate for age and weight.  * for fevers greater than 101 orally you may alternate ibuprofen and tylenol every  3 hours.   Meds ordered this encounter  Medications   molnupiravir EUA 200 mg CAPS    Sig: Take 4 capsules (800 mg total) by mouth 2 (two) times daily for 5 days.    Dispense:  40 capsule    Refill:  0    Order Specific Question:  Supervising Provider    Answer:   Noemi Chapel [[id number]]   Quarnatine for 5 days    Follow Up Instructions: I discussed the assessment and treatment plan with the patient. The patient was provided an opportunity to ask questions and all were answered. The patient agreed with the plan and demonstrated an understanding of the instructions.  A copy of instructions were sent to the patient via MyChart.  The patient was advised to call back or seek an in-person evaluation if the symptoms worsen or if the condition fails to improve as anticipated.  Time:  I spent 12 minutes with the patient via telehealth technology discussing the above problems/concerns.    Mary-Margaret Hassell Done, FNP

## 2021-07-23 ENCOUNTER — Ambulatory Visit: Payer: Self-pay | Admitting: Nurse Practitioner

## 2021-10-29 ENCOUNTER — Encounter: Payer: Self-pay | Admitting: Internal Medicine

## 2021-10-29 ENCOUNTER — Other Ambulatory Visit (HOSPITAL_COMMUNITY): Payer: Self-pay

## 2021-10-29 ENCOUNTER — Ambulatory Visit: Payer: 59 | Attending: Internal Medicine | Admitting: Internal Medicine

## 2021-10-29 ENCOUNTER — Other Ambulatory Visit: Payer: Self-pay

## 2021-10-29 VITALS — BP 149/87 | HR 80 | Ht 67.0 in | Wt 280.8 lb

## 2021-10-29 DIAGNOSIS — I152 Hypertension secondary to endocrine disorders: Secondary | ICD-10-CM | POA: Diagnosis not present

## 2021-10-29 DIAGNOSIS — Z23 Encounter for immunization: Secondary | ICD-10-CM

## 2021-10-29 DIAGNOSIS — Z6841 Body Mass Index (BMI) 40.0 and over, adult: Secondary | ICD-10-CM

## 2021-10-29 DIAGNOSIS — E785 Hyperlipidemia, unspecified: Secondary | ICD-10-CM | POA: Diagnosis not present

## 2021-10-29 DIAGNOSIS — L309 Dermatitis, unspecified: Secondary | ICD-10-CM

## 2021-10-29 DIAGNOSIS — Z7689 Persons encountering health services in other specified circumstances: Secondary | ICD-10-CM

## 2021-10-29 DIAGNOSIS — E1159 Type 2 diabetes mellitus with other circulatory complications: Secondary | ICD-10-CM

## 2021-10-29 DIAGNOSIS — E1169 Type 2 diabetes mellitus with other specified complication: Secondary | ICD-10-CM | POA: Diagnosis not present

## 2021-10-29 LAB — POCT GLYCOSYLATED HEMOGLOBIN (HGB A1C): HbA1c, POC (controlled diabetic range): 7.7 % — AB (ref 0.0–7.0)

## 2021-10-29 LAB — GLUCOSE, POCT (MANUAL RESULT ENTRY): POC Glucose: 176 mg/dl — AB (ref 70–99)

## 2021-10-29 MED ORDER — TRULICITY 0.75 MG/0.5ML ~~LOC~~ SOAJ
0.7500 mg | SUBCUTANEOUS | 5 refills | Status: DC
  Filled 2021-10-29: qty 2, 28d supply, fill #0
  Filled 2021-12-01: qty 2, 28d supply, fill #1
  Filled 2022-01-03: qty 2, 28d supply, fill #2
  Filled 2022-02-05 – 2022-02-16 (×2): qty 2, 28d supply, fill #3
  Filled 2022-03-23 – 2022-04-01 (×2): qty 2, 28d supply, fill #4

## 2021-10-29 MED ORDER — AMLODIPINE BESYLATE 10 MG PO TABS
10.0000 mg | ORAL_TABLET | Freq: Every day | ORAL | 3 refills | Status: DC
  Filled 2021-10-29: qty 90, 90d supply, fill #0
  Filled 2022-01-17: qty 90, 90d supply, fill #1
  Filled 2022-04-29: qty 90, 90d supply, fill #2
  Filled 2022-09-03: qty 90, 90d supply, fill #3

## 2021-10-29 MED ORDER — METFORMIN HCL 500 MG PO TABS
500.0000 mg | ORAL_TABLET | Freq: Two times a day (BID) | ORAL | 6 refills | Status: DC
  Filled 2021-10-29: qty 60, 30d supply, fill #0
  Filled 2021-12-10: qty 60, 30d supply, fill #1
  Filled 2022-01-17: qty 60, 30d supply, fill #2
  Filled 2022-03-05: qty 60, 30d supply, fill #3
  Filled 2022-04-04: qty 60, 30d supply, fill #4

## 2021-10-29 MED ORDER — TRIAMCINOLONE ACETONIDE 0.1 % EX CREA
1.0000 "application " | TOPICAL_CREAM | Freq: Two times a day (BID) | CUTANEOUS | 1 refills | Status: DC
  Filled 2021-10-29: qty 30, 15d supply, fill #0
  Filled 2022-01-03: qty 30, 15d supply, fill #1

## 2021-10-29 NOTE — Progress Notes (Signed)
Patient ID: Valerie Moss, female    DOB: July 20, 1976  MRN: [id number]  CC: est care   Subjective: Valerie Moss is a 46 y.o. female who presents for new pt visit Her concerns today include:  Pt with hx of DM type 2, HTN, obesity, insomnia, COVID-19 infection 2020 hosp x 1 wk, eczema  Previous PCP was Kathe Becton NP at [facility]  She left.  DIABETES TYPE 2/Obesity Last A1C:   BS 176/A1C 7.7 Med Adherence:  on Novolin 70/30 25 units once day but was written as BID.  Stopped the Metformin 500 mg because she did not want to be on too many medications  but tolerated okay when she was on it.  She is wanting to see if she can get on a different type of insulin or Trulicity Medication side effects:  [] Yes    [] No Home Monitoring?  [x] Yes twice a day Home glucose results range: a.m before she eats anything range 130-140. 1 hr after lunch 160-170 (but it depends on what I had to eat). Diet Adherence: eating later than she would like.  Last meals is 6 p.m. Drinks zero sugar drinks.  Rarely eat fruits.  Eats mainly chicken, less pork.  She has not eaten beef in 2 yrs.  Exercise: walks a min 12,000 steps a 5 days a wk.  Tracks steps on phone Hypoglycemic episodes?: [] Yes    [] No Numbness of the feet? [] Yes    [] No Retinopathy hx? [] Yes    [] No Last eye exam: over due for eye exam Comments:  saw nutritionist in past and found it helpful  HTN: She is supposed to be on Norvasc and HCTZ.  Takes Norvasc but not the HCTZ again because she does not want to be on too many medications. Has BP device but too small for her arm.   Needs to be more mindful of salt content in her foods especially when at work.  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  Not on statin.  She tells me that her cholesterol was not discussed with her by her previous PCP .    HM: had flu vaccine 05/2021.  Plans to get pap through GYN.  Last was 2 yrs and was nl.   Done at HD. Patient Active Problem List   Diagnosis Date Noted   Weight loss 09/23/2019   Hemoglobin A1C between 7% and 9% indicating borderline diabetic control ([place]) 09/23/2019   History of 2019 novel coronavirus disease (COVID-19) 09/23/2019   Pneumonia due to COVID-19 virus 08/18/2019   Hypoxia 08/18/2019   Type 2 diabetes mellitus without complication ([place]) 32/67/1245   COVID-19 08/17/2019   Cough 01/13/2019   Class 3 severe obesity due to excess calories with serious comorbidity and body mass index (BMI) of 45.0 to 49.9 in adult ([place]) 01/13/2019   Elevated glucose 01/13/2019   Elevated liver enzymes 01/13/2019   Anemia 01/13/2019   Hypocalcemia 01/13/2019   Pneumonia 12/26/2018   Tachypnea 12/26/2018   Leucocytosis 12/26/2018   Hypokalemia 12/26/2018   Hyponatremia 12/26/2018     Current Outpatient Medications on File Prior to Visit  Medication Sig Dispense Refill   amLODipine (NORVASC) 10 MG tablet Take 1 tablet (10 mg total) by mouth daily. 90 tablet 3   blood glucose meter kit and supplies Dispense based on patient and insurance preference. Use up to four  times daily as directed. (FOR ICD-10 E10.9, E11.9). 1 each 0   hydrochlorothiazide (HYDRODIURIL) 25 MG tablet Take 1 tablet (25 mg total) by mouth daily. 30 tablet 3   Insulin Pen Needle (PEN NEEDLES) 30G X 5 MM MISC 1 each by Does not apply route 2 (two) times daily. 100 each 0   Insulin Syringes, Disposable, U-100 0.3 ML MISC 1 each by Does not apply route 2 (two) times daily. 100 each 0   metFORMIN (GLUCOPHAGE) 500 MG tablet Take 1 tablet (500 mg total) by mouth 2 (two) times daily with a meal. 60 tablet 6   NEEDLE, DISP, 30 G (BD DISP NEEDLES) 30G X 1/2" MISC 1 each by Does not apply route 2 (two) times daily. 100 each 0   traZODone (DESYREL) 50 MG tablet Take 1 tablet (50 mg total) by mouth at bedtime. 30 tablet 6   Vitamin D, Ergocalciferol, (DRISDOL) 1.25 MG (50000 UNIT) CAPS capsule Take 1 capsule (50,000 Units  total) by mouth every 7 (seven) days. 5 capsule 6   No current facility-administered medications on file prior to visit.    Allergies  Allergen Reactions   Pineapple Other (See Comments)    Tongue burns   Lactose Intolerance (Gi)     Social History   Socioeconomic History   Marital status: Single    Spouse name: Not on file   Number of children: Not on file   Years of education: Not on file   Highest education level: Not on file  Occupational History   Not on file  Tobacco Use   Smoking status: Never   Smokeless tobacco: Never  Vaping Use   Vaping Use: Never used  Substance and Sexual Activity   Alcohol use: Never   Drug use: Never   Sexual activity: Not Currently    Partners: Male  Other Topics Concern   Not on file  Social History Narrative   Not on file   Social Determinants of Health   Financial Resource Strain: Not on file  Food Insecurity: Not on file  Transportation Needs: Not on file  Physical Activity: Not on file  Stress: Not on file  Social Connections: Not on file  Intimate Partner Violence: Not on file    Family History  Problem Relation Age of Onset   Hypertension Mother    Diabetes Other     No past surgical history on file.  ROS: Review of Systems Negative except as stated above  PHYSICAL EXAM: BP (!) 149/87    Pulse 80    Ht 5' 7" (1.702 m)    Wt 288 lb (130.6 kg)    SpO2 99%    BMI 45.11 kg/m   Wt Readings from Last 3 Encounters:  10/29/21 288 lb (130.6 kg)  06/19/20 293 lb 9.6 oz (133.2 kg)  12/20/19 290 lb 12.8 oz (131.9 kg)    Physical Exam  General appearance - alert, well appearing, obese middle-age African-American female and in no distress Mental status - normal mood, behavior, speech, dress, motor activity, and thought processes Eyes - pupils equal and reactive, extraocular eye movements intact Nose - normal and patent, no erythema, discharge or polyps Neck - supple, no significant adenopathy Chest - clear to  auscultation, no wheezes, rales or rhonchi, symmetric air entry Heart - normal rate, regular rhythm, normal S1, S2, no murmurs, rubs, clicks or gallops Extremities - peripheral pulses normal, no pedal edema, no clubbing or cyanosis Diabetic Foot Exam - Simple   Simple  Foot Form Diabetic Foot exam was performed with the following findings: Yes 10/29/2021  9:53 AM  Visual Inspection See comments: Yes Sensation Testing Intact to touch and monofilament testing bilaterally: Yes Pulse Check Posterior Tibialis and Dorsalis pulse intact bilaterally: Yes Comments Flat footed.  Toenails slightly overgrown.   Skin: About a 3 cm eczematoid type rash on the dorsal surface of the right forefoot.  Depression screen [facility] 2/9 10/29/2021 07/12/2020 06/19/2020  Decreased Interest 0 0 0  Down, Depressed, Hopeless 0 0 0  PHQ - 2 Score 0 0 0     CMP Latest Ref Rng & Units 12/20/2019 09/21/2019 08/22/2019  Glucose 65 - 99 mg/dL 294(H) 159(H) 237(H)  BUN 6 - 24 mg/dL _0 Creatinine 0.57 - 1.00 mg/dL 0.78 0.78 0.74  Sodium 134 - 144 mmol/L 140 138 136  Potassium 3.5 - 5.2 mmol/L 4.7 4.2 3.5  Chloride 96 - 106 mmol/L 99 101 91(L)  CO2 20 - 29 mmol/L 22 21 33(H)  Calcium 8.7 - 10.2 mg/dL 9.8 10.0 8.9  Total Protein 6.0 - 8.5 g/dL 7.2 7.1 7.6  Total Bilirubin 0.0 - 1.2 mg/dL 0.5 1.0 1.0  Alkaline Phos 39 - 117 IU/L 78 85 67  AST 0 - 40 IU/L _1 ALT 0 - 32 IU/L 15 38(H) 43   Lipid Panel     Component Value Date/Time   CHOL 202 (H) 12/20/2019 1028   TRIG 151 (H) 12/20/2019 1028   HDL 43 12/20/2019 1028   CHOLHDL 4.7 (H) 12/20/2019 1028   LDLCALC 132 (H) 12/20/2019 1028    CBC    Component Value Date/Time   WBC 6.3 12/20/2019 1028   WBC 14.7 (H) 08/22/2019 0723   RBC 5.31 (H) 12/20/2019 1028   RBC 5.19 (H) 08/22/2019 0723   HGB 14.0 12/20/2019 1028   HCT 43.2 12/20/2019 1028   PLT 382 12/20/2019 1028   MCV 81 12/20/2019 1028   MCH 26.4 (L) 12/20/2019 1028   MCH 26.6 08/22/2019 0723    MCHC 32.4 12/20/2019 1028   MCHC 31.9 08/22/2019 0723   RDW 11.6 (L) 12/20/2019 1028   LYMPHSABS 1.7 12/20/2019 1028   MONOABS 0.7 08/22/2019 0723   EOSABS 0.1 12/20/2019 1028   BASOSABS 0.0 12/20/2019 1028    ASSESSMENT AND PLAN:  1. Establishing care with new doctor, encounter for   2. Type 2 diabetes mellitus with morbid obesity (HCC) A1c close to goal. Discussed the importance of healthy eating habits, regular exercise and medication compliance to help control diabetes and certainly to prevent complications. We discussed having her stop the Novolin 70/30 and trying her with Trulicity and metformin instead. Went over possible side effects of Trulicity.  She has no family history or personal history of thyroid cancer.  No personal history of pancreatitis.  Advised that the medication can cause nausea and sometimes vomiting.  If she develops any vomiting or pain in the upper abdomen she should stop the medication immediately and let me know as this can be indicative of pancreatitis.  Went over with her how the medication works and slowing down gastric emptying which brings about early satiety and in so doing helps with weight loss. - Ambulatory referral to Ophthalmology - CBC - Comprehensive metabolic panel - Dulaglutide (TRULICITY) 8.78 MV/6.7MC SOPN; Inject 0.75 mg into the skin once a week.  Dispense: 2 mL; Refill: 5 - Microalbumin / creatinine urine ratio - metFORMIN (GLUCOPHAGE) 500 MG tablet; Take 1 tablet (500 mg  total) by mouth 2 (two) times daily with a meal.  Dispense: 60 tablet; Refill: 6 - POCT glucose (manual entry) - POCT glycosylated hemoglobin (Hb A1C)  3. Hypertension associated with diabetes ([place]) Not at goal.  Goal is 130/80 or lower.  DASH diet discussed and encouraged.  We decided to hold off on adding another medication right now.  She will follow-up with the clinical pharmacist in 2 weeks for repeat blood pressure check.  If still elevated I told her we would  recommend starting another medication. - amLODipine (NORVASC) 10 MG tablet; Take 1 tablet (10 mg total) by mouth daily.  Dispense: 90 tablet; Refill: 3  4. Hyperlipidemia associated with type 2 diabetes mellitus ([place]) Discussed recommendations for diabetics to be on statin therapy to help decrease risk of cardiovascular events.  Told her the goal for LDL to be less than 70.  Patient wants to wait about 6 months to see if she can get her cholesterol down through lifestyle changes. - Lipid panel  5. Eczema, unspecified type Prescription given for triamcinolone to use as needed  6. Need for Tdap vaccination Given  Patient was given the opportunity to ask questions.  Patient verbalized understanding of the plan and was able to repeat key elements of the plan.   No orders of the defined types were placed in this encounter.    Requested Prescriptions   Pending Prescriptions Disp Refills   Dulaglutide (TRULICITY) 8.12 XN/1.7GY SOPN 2 mL 5    Sig: Inject 0.75 mg into the skin once a week.    Return for Sign releaase to get PAP from HD.  Karle Plumber, MD, FACP

## 2021-10-29 NOTE — Patient Instructions (Signed)
Stop Novolin 70/30. Start Trulicity instead.  Please let me know if you experience any vomiting or abdominal pain with the Trulicity as discussed today. Restart metformin 500 mg twice a day Check your blood sugar twice a day before meals.  The goal for blood sugars before meals is 90-130.  If you find that your blood sugars are dropping below 80, please let me know.  Try to limit salt in the foods.  Follow-up in 2 weeks with our clinical pharmacist for blood pressure recheck.  The goal is 130/80 or lower.  Diabetes Mellitus and Nutrition, Adult When you have diabetes, or diabetes mellitus, it is very important to have healthy eating habits because your blood sugar (glucose) levels are greatly affected by what you eat and drink. Eating healthy foods in the right amounts, at about the same times every day, can help you: Manage your blood glucose. Lower your risk of heart disease. Improve your blood pressure. Reach or maintain a healthy weight. What can affect my meal plan? Every person with diabetes is different, and each person has different needs for a meal plan. Your health care provider may recommend that you work with a dietitian to make a meal plan that is best for you. Your meal plan may vary depending on factors such as: The calories you need. The medicines you take. Your weight. Your blood glucose, blood pressure, and cholesterol levels. Your activity level. Other health conditions you have, such as heart or kidney disease. How do carbohydrates affect me? Carbohydrates, also called carbs, affect your blood glucose level more than any other type of food. Eating carbs raises the amount of glucose in your blood. It is important to know how many carbs you can safely have in each meal. This is different for every person. Your dietitian can help you calculate how many carbs you should have at each meal and for each snack. How does alcohol affect me? Alcohol can cause a decrease in blood  glucose (hypoglycemia), especially if you use insulin or take certain diabetes medicines by mouth. Hypoglycemia can be a life-threatening condition. Symptoms of hypoglycemia, such as sleepiness, dizziness, and confusion, are similar to symptoms of having too much alcohol. Do not drink alcohol if: Your health care provider tells you not to drink. You are pregnant, may be pregnant, or are planning to become pregnant. If you drink alcohol: Limit how much you have to: 0-1 drink a day for women. 0-2 drinks a day for men. Know how much alcohol is in your drink. In the [place] one drink equals one 12 oz bottle of beer (355 mL), one 5 oz glass of wine (148 mL), or one 1 oz glass of hard liquor (44 mL). Keep yourself hydrated with water, diet soda, or unsweetened iced tea. Keep in mind that regular soda, juice, and other mixers may contain a lot of sugar and must be counted as carbs. What are tips for following this plan? Reading food labels Start by checking the serving size on the Nutrition Facts label of packaged foods and drinks. The number of calories and the amount of carbs, fats, and other nutrients listed on the label are based on one serving of the item. Many items contain more than one serving per package. Check the total grams (g) of carbs in one serving. Check the number of grams of saturated fats and trans fats in one serving. Choose foods that have a low amount or none of these fats. Check the number of milligrams (mg)  of salt (sodium) in one serving. Most people should limit total sodium intake to less than 2,300 mg per day. Always check the nutrition information of foods labeled as "low-fat" or "nonfat." These foods may be higher in added sugar or refined carbs and should be avoided. Talk to your dietitian to identify your daily goals for nutrients listed on the label. Shopping Avoid buying canned, pre-made, or processed foods. These foods tend to be high in fat, sodium, and added  sugar. Shop around the outside edge of the grocery store. This is where you will most often find fresh fruits and vegetables, bulk grains, fresh meats, and fresh dairy products. Cooking Use low-heat cooking methods, such as baking, instead of high-heat cooking methods, such as deep frying. Cook using healthy oils, such as olive, canola, or sunflower oil. Avoid cooking with butter, cream, or high-fat meats. Meal planning Eat meals and snacks regularly, preferably at the same times every day. Avoid going long periods of time without eating. Eat foods that are high in fiber, such as fresh fruits, vegetables, beans, and whole grains. Eat 4-6 oz (112-168 g) of lean protein each day, such as lean meat, chicken, fish, eggs, or tofu. One ounce (oz) (28 g) of lean protein is equal to: 1 oz (28 g) of meat, chicken, or fish. 1 egg.  cup (62 g) of tofu. Eat some foods each day that contain healthy fats, such as avocado, nuts, seeds, and fish. What foods should I eat? Fruits Berries. Apples. Oranges. Peaches. Apricots. Plums. Grapes. Mangoes. Papayas. Pomegranates. Kiwi. Cherries. Vegetables Leafy greens, including lettuce, spinach, kale, chard, collard greens, mustard greens, and cabbage. Beets. Cauliflower. Broccoli. Carrots. Green beans. Tomatoes. Peppers. Onions. Cucumbers. Brussels sprouts. Grains Whole grains, such as whole-wheat or whole-grain bread, crackers, tortillas, cereal, and pasta. Unsweetened oatmeal. Quinoa. Brown or wild rice. Meats and other proteins Seafood. Poultry without skin. Lean cuts of poultry and beef. Tofu. Nuts. Seeds. Dairy Low-fat or fat-free dairy products such as milk, yogurt, and cheese. The items listed above may not be a complete list of foods and beverages you can eat and drink. Contact a dietitian for more information. What foods should I avoid? Fruits Fruits canned with syrup. Vegetables Canned vegetables. Frozen vegetables with butter or cream  sauce. Grains Refined white flour and flour products such as bread, pasta, snack foods, and cereals. Avoid all processed foods. Meats and other proteins Fatty cuts of meat. Poultry with skin. Breaded or fried meats. Processed meat. Avoid saturated fats. Dairy Full-fat yogurt, cheese, or milk. Beverages Sweetened drinks, such as soda or iced tea. The items listed above may not be a complete list of foods and beverages you should avoid. Contact a dietitian for more information. Questions to ask a health care provider Do I need to meet with a certified diabetes care and education specialist? Do I need to meet with a dietitian? What number can I call if I have questions? When are the best times to check my blood glucose? Where to find more information: American Diabetes Association: diabetes.org Academy of Nutrition and Dietetics: eatright[employer] of Diabetes and Digestive and Kidney Diseases: [employer]si Association of Diabetes Care & Education Specialists: diabeteseducator.org Summary It is important to have healthy eating habits because your blood sugar (glucose) levels are greatly affected by what you eat and drink. It is important to use alcohol carefully. A healthy meal plan will help you manage your blood glucose and lower your risk of heart disease. Your health care provider may  recommend that you work with a dietitian to make a meal plan that is best for you. This information is not intended to replace advice given to you by your health care provider. Make sure you discuss any questions you have with your health care provider. Document Revised: 03/29/2020 Document Reviewed: 03/29/2020 Elsevier Patient Education  2022 [employer].

## 2021-10-30 ENCOUNTER — Other Ambulatory Visit (HOSPITAL_COMMUNITY): Payer: Self-pay

## 2021-10-30 LAB — CBC
Hematocrit: 41.8 % (ref 34.0–46.6)
Hemoglobin: 13.5 g/dL (ref 11.1–15.9)
MCH: 26.2 pg — ABNORMAL LOW (ref 26.6–33.0)
MCHC: 32.3 g/dL (ref 31.5–35.7)
MCV: 81 fL (ref 79–97)
Platelets: 388 10*3/uL (ref 150–450)
RBC: 5.16 x10E6/uL (ref 3.77–5.28)
RDW: 11.3 % — ABNORMAL LOW (ref 11.7–15.4)
WBC: 6 10*3/uL (ref 3.4–10.8)

## 2021-10-30 LAB — LIPID PANEL
Chol/HDL Ratio: 4.5 ratio — ABNORMAL HIGH (ref 0.0–4.4)
Cholesterol, Total: 183 mg/dL (ref 100–199)
HDL: 41 mg/dL (ref >39)
LDL Chol Calc (NIH): 130 mg/dL — ABNORMAL HIGH (ref 0–99)
Triglycerides: 61 mg/dL (ref 0–149)
VLDL Cholesterol Cal: 12 mg/dL (ref 5–40)

## 2021-10-30 LAB — COMPREHENSIVE METABOLIC PANEL
ALT: 16 IU/L (ref 0–32)
AST: 13 IU/L (ref 0–40)
Albumin/Globulin Ratio: 1.8 (ref 1.2–2.2)
Albumin: 4.6 g/dL (ref 3.8–4.8)
Alkaline Phosphatase: 69 IU/L (ref 44–121)
BUN/Creatinine Ratio: 14 (ref 9–23)
BUN: 10 mg/dL (ref 6–24)
Bilirubin Total: 1 mg/dL (ref 0.0–1.2)
CO2: 25 mmol/L (ref 20–29)
Calcium: 9.3 mg/dL (ref 8.7–10.2)
Chloride: 100 mmol/L (ref 96–106)
Creatinine, Ser: 0.7 mg/dL (ref 0.57–1.00)
Globulin, Total: 2.6 g/dL (ref 1.5–4.5)
Glucose: 158 mg/dL — ABNORMAL HIGH (ref 70–99)
Potassium: 4.4 mmol/L (ref 3.5–5.2)
Sodium: 136 mmol/L (ref 134–144)
Total Protein: 7.2 g/dL (ref 6.0–8.5)
eGFR: 109 mL/min/{1.73_m2} (ref >59)

## 2021-10-30 LAB — MICROALBUMIN / CREATININE URINE RATIO
Creatinine, Urine: 151.6 mg/dL
Microalb/Creat Ratio: 6 mg/g creat (ref 0–29)
Microalbumin, Urine: 8.4 ug/mL

## 2021-10-31 NOTE — Progress Notes (Signed)
Let patient know that her blood cell counts are normal.   Kidney and liver function tests are normal. Cholesterol levels still elevated at 130 with goal being less than 70.  High cholesterol puts her at risk for heart attack and strokes.  I recommend starting a medication called atorvastatin to help lower the cholesterol.  If she still wants to wait 6 months to see if she can get her cholesterol down with healthy eating and regular exercise, she can do so.  Let me know if she decides to start the medication at this time.  No abnormal amounts of protein in the urine.  This is good.

## 2021-11-05 ENCOUNTER — Other Ambulatory Visit (HOSPITAL_COMMUNITY): Payer: Self-pay

## 2021-11-07 ENCOUNTER — Telehealth: Payer: Self-pay

## 2021-11-07 ENCOUNTER — Other Ambulatory Visit (HOSPITAL_COMMUNITY): Payer: Self-pay

## 2021-11-07 NOTE — Telephone Encounter (Signed)
PA for Trulicity approved until 11/07/2022. Pharmacy notified. ?

## 2021-11-23 NOTE — Progress Notes (Signed)
? ?S:    ?Valerie Moss is a 46 y.o. female who presents for hypertension evaluation, education, and management. PMH is significant for HTN, T2DM, HLD, obesity. Patient was referred and last seen by Primary Care Provider, Dr. Laural Benes, on 10/29/21. At last visit, BP was 149/87.  ? ?Today, she arrives in good spirits and presents without assistance. Denies dizziness, headache, blurred vision, swelling.  ? ?Patient reports hypertension was diagnosed in 2020.  ? ?Family/Social history:  ?-HTN in mother ?-Never smoker ? ?Medication adherence reported. Patient has not taken BP medications today. Takes it daily at 11:30am-12:30pm at the same time she takes metformin.  ? ?Current antihypertensives include: amlodipine 10 mg daily ? ?Antihypertensives tried in the past include: HCTZ (hair falling out) ? ?Reported home BP readings: not checking at home. Has a machine but cuff is too small.  ? ?Patient reported dietary habits: working on losing weight, trying to avoid carbs, doesn't add salt to food at home, eats from the hospital cafe ? 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 ?ASCVD risk factors include: HTN, T2DM, HLD, obesity ? ?O:  ?Vitals:  ? 11/26/21 0958  ?BP: 137/86  ?Pulse: 87  ? ? ?Last 3 Office BP readings: ?BP Readings from Last 3 Encounters:  ?11/26/21 137/86  ?10/29/21 (!) 149/87  ?06/19/20 (!) 171/95  ? ? ?BMET ?   ?Component Value Date/Time  ? NA 136 10/29/2021 1024  ? K 4.4 10/29/2021 1024  ? CL 100 10/29/2021 1024  ? CO2 25 10/29/2021 1024  ? GLUCOSE 158 (H) 10/29/2021 1024  ? GLUCOSE 237 (H) 08/22/2019 0723  ? BUN 10 10/29/2021 1024  ? CREATININE 0.70 10/29/2021 1024  ? CALCIUM 9.3 10/29/2021 1024  ? GFRNONAA 94 12/20/2019 1028  ? GFRAA 108 12/20/2019 1028  ? ? ?Renal function: ?CrCl cannot be calculated (Patient's most recent lab result is older than the maximum 21 days allowed.). ? ?Clinical  ASCVD: No  ?The 10-year ASCVD risk score (Arnett DK, et al., 2019) is: 11.2% ?  Values used to calculate the score: ?    Age: 48 years ?    Sex: Female ?    Is Non-Hispanic African American: Yes ?    Diabetic: Yes ?    Tobacco smoker: No ?    Systolic Blood Pressure: 137 mmHg ?    Is BP treated: Yes ?    HDL Cholesterol: 41 mg/dL ?    Total Cholesterol: 183 mg/dL ? ? ?A/P: ?Hypertension diagnosed currently uncontrolled but SBP has improved on current medications. BP goal < 130/80 mmHg. Medication adherence appears optimal. BP is improved from last visit but still not at goal. She reports her diastolic is persistently elevated even right after taking amlodipine for the day. Will add a low dose ARB.  ?-Start valsartan 40 mg daily.  ?-Continue amlodipine 10 mg daily.  ?-Patient educated on purpose, proper use, and potential adverse effects of valsartan.  ?-F/u labs ordered - BMET at next visit ?-Counseled on lifestyle modifications for blood pressure control including reduced dietary sodium, increased exercise, adequate sleep. ?-Encouraged patient to check BP at home and bring log of readings to next visit. Counseled on proper use of home BP cuff.  ? ?Results reviewed and written information provided. Patient verbalized understanding of treatment plan. Total time in face-to-face counseling 28 minutes.  ? ?F/u clinic visit in 3-4 weeks with [facility]. Next PCP visit 03/04/22. ? ?Pervis Hocking,  PharmD ?PGY2 Ambulatory Care Pharmacy Resident ?11/26/2021 10:07 AM ? ?

## 2021-11-26 ENCOUNTER — Ambulatory Visit: Payer: 59 | Attending: Internal Medicine | Admitting: Pharmacist

## 2021-11-26 ENCOUNTER — Other Ambulatory Visit (HOSPITAL_COMMUNITY): Payer: Self-pay

## 2021-11-26 ENCOUNTER — Encounter: Payer: Self-pay | Admitting: Pharmacist

## 2021-11-26 ENCOUNTER — Other Ambulatory Visit: Payer: Self-pay

## 2021-11-26 VITALS — BP 137/86 | HR 87

## 2021-11-26 DIAGNOSIS — I152 Hypertension secondary to endocrine disorders: Secondary | ICD-10-CM | POA: Diagnosis not present

## 2021-11-26 DIAGNOSIS — E1159 Type 2 diabetes mellitus with other circulatory complications: Secondary | ICD-10-CM

## 2021-11-26 MED ORDER — VALSARTAN 40 MG PO TABS
40.0000 mg | ORAL_TABLET | Freq: Every day | ORAL | 3 refills | Status: DC
  Filled 2021-11-26: qty 90, 90d supply, fill #0

## 2021-12-01 ENCOUNTER — Other Ambulatory Visit (HOSPITAL_COMMUNITY): Payer: Self-pay

## 2021-12-10 ENCOUNTER — Other Ambulatory Visit (HOSPITAL_COMMUNITY): Payer: Self-pay

## 2021-12-17 ENCOUNTER — Ambulatory Visit: Payer: 59 | Admitting: Pharmacist

## 2021-12-18 ENCOUNTER — Encounter: Payer: Self-pay | Admitting: Pharmacist

## 2021-12-18 ENCOUNTER — Ambulatory Visit: Payer: 59 | Attending: Internal Medicine | Admitting: Pharmacist

## 2021-12-18 VITALS — BP 126/78 | HR 85

## 2021-12-18 DIAGNOSIS — I152 Hypertension secondary to endocrine disorders: Secondary | ICD-10-CM | POA: Diagnosis not present

## 2021-12-18 DIAGNOSIS — E1159 Type 2 diabetes mellitus with other circulatory complications: Secondary | ICD-10-CM | POA: Diagnosis not present

## 2021-12-18 NOTE — Progress Notes (Signed)
? ?  S:    ?Valerie Moss is a 46 y.o. female who presents for hypertension evaluation, education, and management. PMH is significant for HTN, T2DM, HLD, obesity. Patient was referred and last seen by Primary Care Provider, Dr. Laural Benes, on 10/29/21. At last visit, BP was 149/87. Pharmacy saw her 11/26/21 and started valsartan. ? ?Today, she arrives in good spirits and presents without assistance. Denies dizziness, headache, blurred vision, swelling. Denies any stress or anxiety.  ? ?Patient reports hypertension was diagnosed in 2020.  ? ?Family/Social history:  ?-HTN in mother ?-Never smoker ?-Alcohol: none reported ? ?Medication adherence reported. Patient has taken BP medications today.  ? ?Current antihypertensives include: amlodipine 10 mg daily, valsartan 40 mg daily  ? ?Antihypertensives tried in the past include: HCTZ (hair falling out) ? ?Reported home BP readings: not checking at home. Has a machine but cuff is too small.  ? ?Patient reported dietary habits: working on losing weight, trying to avoid carbs, doesn't add salt to food at home, eats from the hospital cafe. Denies drinking any excessive caffeine.  ? 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 ?ASCVD risk factors include: HTN, T2DM, HLD, obesity ? ?O:  ?Vitals:  ? 12/18/21 0900  ?BP: 126/78  ?Pulse: 85  ? ? ?Last 3 Office BP readings: ?BP Readings from Last 3 Encounters:  ?12/18/21 126/78  ?11/26/21 137/86  ?10/29/21 (!) 149/87  ? ? ?BMET ?   ?Component Value Date/Time  ? NA 136 10/29/2021 1024  ? K 4.4 10/29/2021 1024  ? CL 100 10/29/2021 1024  ? CO2 25 10/29/2021 1024  ? GLUCOSE 158 (H) 10/29/2021 1024  ? GLUCOSE 237 (H) 08/22/2019 0723  ? BUN 10 10/29/2021 1024  ? CREATININE 0.70 10/29/2021 1024  ? CALCIUM 9.3 10/29/2021 1024  ? GFRNONAA 94 12/20/2019 1028  ? GFRAA 108 12/20/2019 1028  ? ? ?Renal function: ?CrCl cannot be calculated  (Patient's most recent lab result is older than the maximum 21 days allowed.). ? ?Clinical ASCVD: No  ?The 10-year ASCVD risk score (Arnett DK, et al., 2019) is: 7.5% ?  Values used to calculate the score: ?    Age: 42 years ?    Sex: Female ?    Is Non-Hispanic African American: Yes ?    Diabetic: Yes ?    Tobacco smoker: No ?    Systolic Blood Pressure: 126 mmHg ?    Is BP treated: Yes ?    HDL Cholesterol: 41 mg/dL ?    Total Cholesterol: 183 mg/dL ? ? ?A/P: ?Hypertension diagnosed currently at goal on current medications. BP goal < 130/80 mmHg. Medication adherence appears optimal. ?-Continue current regimen.  ?-F/u labs ordered - BMP ?-Counseled on lifestyle modifications for blood pressure control including reduced dietary sodium, increased exercise, adequate sleep. ?-Encouraged patient to check BP at home and bring log of readings to next visit. Counseled on proper use of home BP cuff.  ? ?Results reviewed and written information provided. Patient verbalized understanding of treatment plan. Total time in face-to-face counseling 28 minutes.  ? ?F/u PCP visit 03/04/22. ? ?Butch Penny, PharmD, BCACP, CPP ?Clinical Pharmacist ?[facility] ?[phone] ? ?

## 2021-12-19 LAB — BASIC METABOLIC PANEL
BUN/Creatinine Ratio: 14 (ref 9–23)
BUN: 9 mg/dL (ref 6–24)
CO2: 23 mmol/L (ref 20–29)
Calcium: 9.4 mg/dL (ref 8.7–10.2)
Chloride: 104 mmol/L (ref 96–106)
Creatinine, Ser: 0.66 mg/dL (ref 0.57–1.00)
Glucose: 118 mg/dL — ABNORMAL HIGH (ref 70–99)
Potassium: 4.2 mmol/L (ref 3.5–5.2)
Sodium: 139 mmol/L (ref 134–144)
eGFR: 111 mL/min/{1.73_m2} (ref >59)

## 2022-01-03 ENCOUNTER — Other Ambulatory Visit (HOSPITAL_COMMUNITY): Payer: Self-pay

## 2022-01-17 ENCOUNTER — Other Ambulatory Visit (HOSPITAL_COMMUNITY): Payer: Self-pay

## 2022-02-05 ENCOUNTER — Other Ambulatory Visit (HOSPITAL_COMMUNITY): Payer: Self-pay

## 2022-02-13 ENCOUNTER — Other Ambulatory Visit (HOSPITAL_COMMUNITY): Payer: Self-pay

## 2022-02-16 ENCOUNTER — Other Ambulatory Visit (HOSPITAL_COMMUNITY): Payer: Self-pay

## 2022-03-04 ENCOUNTER — Encounter: Payer: Self-pay | Admitting: Internal Medicine

## 2022-03-04 ENCOUNTER — Other Ambulatory Visit (HOSPITAL_COMMUNITY)
Admission: RE | Admit: 2022-03-04 | Discharge: 2022-03-04 | Disposition: A | Discharge status: Home / Self Care | Payer: No Typology Code available for payment source | Source: Ambulatory Visit | Attending: Internal Medicine | Admitting: Internal Medicine

## 2022-03-04 ENCOUNTER — Ambulatory Visit: Payer: No Typology Code available for payment source | Attending: Internal Medicine | Admitting: Internal Medicine

## 2022-03-04 VITALS — BP 126/78 | HR 84 | Temp 98.2°F | Resp 16 | Wt 270.0 lb

## 2022-03-04 DIAGNOSIS — E1159 Type 2 diabetes mellitus with other circulatory complications: Secondary | ICD-10-CM | POA: Diagnosis not present

## 2022-03-04 DIAGNOSIS — R1032 Left lower quadrant pain: Secondary | ICD-10-CM

## 2022-03-04 DIAGNOSIS — I152 Hypertension secondary to endocrine disorders: Secondary | ICD-10-CM

## 2022-03-04 DIAGNOSIS — Z1331 Encounter for screening for depression: Secondary | ICD-10-CM

## 2022-03-04 DIAGNOSIS — E1169 Type 2 diabetes mellitus with other specified complication: Secondary | ICD-10-CM | POA: Diagnosis not present

## 2022-03-04 DIAGNOSIS — E785 Hyperlipidemia, unspecified: Secondary | ICD-10-CM

## 2022-03-04 LAB — POCT URINALYSIS DIP (CLINITEK)
Bilirubin, UA: NEGATIVE
Glucose, UA: NEGATIVE mg/dL
Ketones, POC UA: NEGATIVE mg/dL
Leukocytes, UA: NEGATIVE
Nitrite, UA: NEGATIVE
POC PROTEIN,UA: NEGATIVE
Spec Grav, UA: 1.02 (ref 1.010–1.025)
Urobilinogen, UA: 0.2 E.U./dL
pH, UA: 7 (ref 5.0–8.0)

## 2022-03-04 LAB — POCT GLYCOSYLATED HEMOGLOBIN (HGB A1C): Hemoglobin A1C: 5.4 % (ref 4.0–5.6)

## 2022-03-04 LAB — GLUCOSE, POCT (MANUAL RESULT ENTRY): POC Glucose: 118 mg/dl — AB (ref 70–99)

## 2022-03-05 ENCOUNTER — Other Ambulatory Visit (HOSPITAL_COMMUNITY): Payer: Self-pay

## 2022-03-05 LAB — CERVICOVAGINAL ANCILLARY ONLY
Chlamydia: NEGATIVE
Comment: NEGATIVE
Comment: NEGATIVE
Comment: NORMAL
Neisseria Gonorrhea: NEGATIVE
Trichomonas: NEGATIVE

## 2022-03-23 ENCOUNTER — Other Ambulatory Visit (HOSPITAL_COMMUNITY): Payer: Self-pay

## 2022-04-01 ENCOUNTER — Other Ambulatory Visit (HOSPITAL_COMMUNITY): Payer: Self-pay

## 2022-04-04 ENCOUNTER — Other Ambulatory Visit (HOSPITAL_COMMUNITY): Payer: Self-pay

## 2022-04-12 ENCOUNTER — Ambulatory Visit: Payer: No Typology Code available for payment source | Attending: Internal Medicine | Admitting: Internal Medicine

## 2022-04-12 ENCOUNTER — Other Ambulatory Visit (HOSPITAL_COMMUNITY): Payer: Self-pay

## 2022-04-12 ENCOUNTER — Encounter: Payer: Self-pay | Admitting: Internal Medicine

## 2022-04-12 VITALS — BP 144/87 | HR 85 | Temp 97.9°F | Ht 67.0 in | Wt 274.2 lb

## 2022-04-12 DIAGNOSIS — E1159 Type 2 diabetes mellitus with other circulatory complications: Secondary | ICD-10-CM

## 2022-04-12 DIAGNOSIS — E1169 Type 2 diabetes mellitus with other specified complication: Secondary | ICD-10-CM | POA: Diagnosis not present

## 2022-04-12 DIAGNOSIS — Z0000 Encounter for general adult medical examination without abnormal findings: Secondary | ICD-10-CM

## 2022-04-12 DIAGNOSIS — Z6841 Body Mass Index (BMI) 40.0 and over, adult: Secondary | ICD-10-CM

## 2022-04-12 DIAGNOSIS — Z0001 Encounter for general adult medical examination with abnormal findings: Secondary | ICD-10-CM

## 2022-04-12 DIAGNOSIS — Z1231 Encounter for screening mammogram for malignant neoplasm of breast: Secondary | ICD-10-CM

## 2022-04-12 DIAGNOSIS — I152 Hypertension secondary to endocrine disorders: Secondary | ICD-10-CM

## 2022-04-12 DIAGNOSIS — Z1159 Encounter for screening for other viral diseases: Secondary | ICD-10-CM

## 2022-04-12 LAB — GLUCOSE, POCT (MANUAL RESULT ENTRY): POC Glucose: 93 mg/dl (ref 70–99)

## 2022-04-12 MED ORDER — TRULICITY 1.5 MG/0.5ML ~~LOC~~ SOAJ
1.5000 mg | SUBCUTANEOUS | 4 refills | Status: DC
  Filled 2022-04-12 – 2022-04-29 (×2): qty 2, 28d supply, fill #0
  Filled 2022-06-22: qty 2, 28d supply, fill #1
  Filled 2022-07-23: qty 2, 28d supply, fill #2
  Filled 2022-09-03: qty 2, 28d supply, fill #3
  Filled 2022-11-03: qty 2, 28d supply, fill #4

## 2022-04-12 MED ORDER — METFORMIN HCL 500 MG PO TABS
500.0000 mg | ORAL_TABLET | Freq: Every day | ORAL | 3 refills | Status: DC
  Filled 2022-04-12 – 2022-04-29 (×2): qty 30, 30d supply, fill #0
  Filled 2022-06-22: qty 30, 30d supply, fill #1
  Filled 2022-07-23: qty 30, 30d supply, fill #2
  Filled 2022-09-03: qty 30, 30d supply, fill #3

## 2022-04-12 NOTE — Progress Notes (Signed)
Patient ID: Valerie Moss, female    DOB: Aug 19, 1976  MRN: [id number]  CC: Annual Exam   Subjective: Valerie Moss is a 46 y.o. female who presents for physical Her concerns today include:  Pt with hx of DM type 2, HTN, obesity, insomnia, COVID-19 infection 2020 hosp x 1 wk, eczema  HM:  due for eye exam.  Missed one that was scheduled in June with Dr. Katy Fitch. Discussed breast cancer screening.  Agrees for hep c screening.  Wants to do pap next mth  HTN:  elev today. Took her Norvasc 10 mg less than 1 hr ago.  Not checking BP.  Blood pressure on last visit in June was at goal. Can do better in limiting salt No CP/SOB/LE edema/dizziness  Obesity:  drinks water and sugar free Minute Maid and tea Does not feel Trulicity has dec appetite much.  Feels drinking more water does. Will be back to regular work shifts starting this mth.  Doing better with meal planning.  Eating lean meat and double veggies.   Does a lot of walking at work.  She has an app on her phone that tracks her steps.  According to the appendectomy she is walking 5 to 6 miles a day just from the walking that she does at work.   Checks BS BID after meals.  Range 90s.  Highest was 118.    Patient Active Problem List   Diagnosis Date Noted   Hypertension associated with diabetes ([place]) 10/29/2021   Hyperlipidemia associated with type 2 diabetes mellitus ([place]) 10/29/2021   Type 2 diabetes mellitus with morbid obesity ([place]) 10/29/2021   Eczema 10/29/2021   Weight loss 09/23/2019   Hemoglobin A1C between 7% and 9% indicating borderline diabetic control ([place]) 09/23/2019   History of 2019 novel coronavirus disease (COVID-19) 09/23/2019   Pneumonia due to COVID-19 virus 08/18/2019   Hypoxia 08/18/2019   Type 2 diabetes mellitus without complication ([place]) 00/76/2263   COVID-19 08/17/2019   Cough 01/13/2019   Class 3 severe obesity due to excess calories with serious comorbidity and body mass index (BMI) of 45.0 to 49.9  in adult ([place]) 01/13/2019   Elevated glucose 01/13/2019   Elevated liver enzymes 01/13/2019   Anemia 01/13/2019   Hypocalcemia 01/13/2019   Pneumonia 12/26/2018   Tachypnea 12/26/2018   Leucocytosis 12/26/2018   Hypokalemia 12/26/2018   Hyponatremia 12/26/2018     Current Outpatient Medications on File Prior to Visit  Medication Sig Dispense Refill   amLODipine (NORVASC) 10 MG tablet Take 1 tablet (10 mg total) by mouth daily. 90 tablet 3   blood glucose meter kit and supplies Dispense based on patient and insurance preference. Use up to four times daily as directed. (FOR ICD-10 E10.9, E11.9). 1 each 0   Dulaglutide (TRULICITY) 3.35 KT/6.2BW SOPN Inject 0.75 mg into the skin once a week. 2 mL 5   metFORMIN (GLUCOPHAGE) 500 MG tablet Take 1 tablet (500 mg total) by mouth 2 (two) times daily with a meal. 60 tablet 6   traZODone (DESYREL) 50 MG tablet Take 1 tablet (50 mg total) by mouth at bedtime. 30 tablet 6   triamcinolone cream (KENALOG) 0.1 % Apply 1 application topically 2 (two) times daily. 30 g 1   Vitamin D, Ergocalciferol, (DRISDOL) 1.25 MG (50000 UNIT) CAPS capsule Take 1 capsule (50,000 Units total) by mouth every 7 (seven) days. (Patient not taking: Reported on 04/12/2022) 5 capsule 6   No current facility-administered medications on file prior to visit.  Allergies  Allergen Reactions   Pineapple Other (See Comments)    Tongue burns   Lactose Intolerance (Gi)    Hydrochlorothiazide Other (See Comments)    Patient reports hair fell out    Social History   Socioeconomic History   Marital status: Single    Spouse name: Not on file   Number of children: Not on file   Years of education: Not on file   Highest education level: Not on file  Occupational History   Not on file  Tobacco Use   Smoking status: Never   Smokeless tobacco: Never  Vaping Use   Vaping Use: Never used  Substance and Sexual Activity   Alcohol use: Never   Drug use: Never   Sexual activity:  Not Currently    Partners: Male  Other Topics Concern   Not on file  Social History Narrative   Not on file   Social Determinants of Health   Financial Resource Strain: Low Risk  (08/18/2019)   Overall Financial Resource Strain (CARDIA)    Difficulty of Paying Living Expenses: Not hard at all  Food Insecurity: No Food Insecurity (08/18/2019)   Hunger Vital Sign    Worried About Running Out of Food in the Last Year: Never true    [place] in the Last Year: Never true  Transportation Needs: No Transportation Needs (08/18/2019)   PRAPARE - Hydrologist (Medical): No    Lack of Transportation (Non-Medical): No  Physical Activity: Sufficiently Active (08/18/2019)   Exercise Vital Sign    Days of Exercise per Week: 7 days    Minutes of Exercise per Session: 150+ min  Stress: No Stress Concern Present (08/18/2019)   [place]    Feeling of Stress : Not at all  Social Connections: Socially Isolated (08/18/2019)   Social Connection and Isolation Panel [NHANES]    Frequency of Communication with Friends and Family: Twice a week    Frequency of Social Gatherings with Friends and Family: Never    Attends Religious Services: Never    Marine scientist or Organizations: No    Attends Archivist Meetings: Never    Marital Status: Never married  Intimate Partner Violence: Not At Risk (08/18/2019)   Humiliation, Afraid, Rape, and Kick questionnaire    Fear of Current or Ex-Partner: No    Emotionally Abused: No    Physically Abused: No    Sexually Abused: No    Family History  Problem Relation Age of Onset   Hypertension Mother    Diabetes Other     No past surgical history on file.  ROS: Review of Systems  HENT:  Negative for congestion, ear pain, hearing loss and trouble swallowing.        Does regular dental exams.  Brush teeth regularly but does not floss.  Eyes:   Negative for visual disturbance.  Gastrointestinal:  Negative for blood in stool.       Moving bowels okay   PHYSICAL EXAM: BP (!) 144/87   Pulse 85   Temp 97.9 F (36.6 C) (Oral)   Ht _0  (1.702 m)   Wt 274 lb 3.2 oz (124.4 kg)   SpO2 100%   BMI 42.95 kg/m   Wt Readings from Last 3 Encounters:  04/12/22 274 lb 3.2 oz (124.4 kg)  03/04/22 270 lb (122.5 kg)  10/29/21 280 lb 12.8 oz (127.4 kg)  BP  133/90  Physical Exam  General appearance - alert, well appearing, middle-age obese African-American female and in no distress Mental status - normal mood, behavior, speech, dress, motor activity, and thought processes Eyes - pupils equal and reactive, extraocular eye movements intact Ears - bilateral TM's and external ear canals normal Nose - normal and patent, no erythema, discharge or polyps Mouth - mucous membranes moist, pharynx normal without lesions Neck - supple, no significant adenopathy Chest - clear to auscultation, no wheezes, rales or rhonchi, symmetric air entry Heart -regular rate and rhythm.  1/6 SEM along the left upper sternal border. Abdomen - soft, nontender, nondistended, no masses or organomegaly Extremities - peripheral pulses normal, no pedal edema, no clubbing or cyanosis      Latest Ref Rng & Units 12/18/2021    9:06 AM 10/29/2021   10:24 AM 12/20/2019   10:28 AM  CMP  Glucose 70 - 99 mg/dL 118  158  294   BUN 6 - 24 mg/dL _0 Creatinine 0.57 - 1.00 mg/dL 0.66  0.70  0.78   Sodium 134 - 144 mmol/L 139  136  140   Potassium 3.5 - 5.2 mmol/L 4.2  4.4  4.7   Chloride 96 - 106 mmol/L 104  100  99   CO2 20 - 29 mmol/L _1 Calcium 8.7 - 10.2 mg/dL 9.4  9.3  9.8   Total Protein 6.0 - 8.5 g/dL  7.2  7.2   Total Bilirubin 0.0 - 1.2 mg/dL  1.0  0.5   Alkaline Phos 44 - 121 IU/L  69  78   AST 0 - 40 IU/L  13  9   ALT 0 - 32 IU/L  16  15    Lipid Panel     Component Value Date/Time   CHOL 183 10/29/2021 1024   TRIG 61 10/29/2021 1024    HDL 41 10/29/2021 1024   CHOLHDL 4.5 (H) 10/29/2021 1024   LDLCALC 130 (H) 10/29/2021 1024    CBC    Component Value Date/Time   WBC 6.0 10/29/2021 1024   WBC 14.7 (H) 08/22/2019 0723   RBC 5.16 10/29/2021 1024   RBC 5.19 (H) 08/22/2019 0723   HGB 13.5 10/29/2021 1024   HCT 41.8 10/29/2021 1024   PLT 388 10/29/2021 1024   MCV 81 10/29/2021 1024   MCH 26.2 (L) 10/29/2021 1024   MCH 26.6 08/22/2019 0723   MCHC 32.3 10/29/2021 1024   MCHC 31.9 08/22/2019 0723   RDW 11.3 (L) 10/29/2021 1024   LYMPHSABS 1.7 12/20/2019 1028   MONOABS 0.7 08/22/2019 0723   EOSABS 0.1 12/20/2019 1028   BASOSABS 0.0 12/20/2019 1028    ASSESSMENT AND PLAN: 1. Annual physical exam   2. Hypertension associated with diabetes ([place]) Not at goal.  We discussed adding another medication versus having her check blood pressure once a week between now and her next visit with me in September and bringing those numbers with her to that visit.  She opted for the latter.  DASH diet encouraged.  3. Type 2 diabetes mellitus with morbid obesity ([place]) Commended her on changes that she is made in her eating habits so far. We discussed increasing the Trulicity to see if it would help bring about some weight loss.  She is agreeable to this.  I went over with her again some of the side effects of Trulicity including nausea.  If she develops any vomiting or  pain in the upper abdomen, she is advised to stop the medication and give me a call.  We will increase the dose to 1.5 mg once a week.  Decrease metformin to 500 mg once a day.  If after a few weeks her blood sugars are staying good, she can stop the metformin. Encouraged her to stay active. - POCT glucose (manual entry) - Ambulatory referral to Ophthalmology - metFORMIN (GLUCOPHAGE) 500 MG tablet; Take 1 tablet (500 mg total) by mouth daily with breakfast.  Dispense: 30 tablet; Refill: 3 - Dulaglutide (TRULICITY) 1.5 WI/2.0BT SOPN; Inject 1.5 mg into the skin once a  week.  Dispense: 2 mL; Refill: 4  4. Encounter for screening mammogram for malignant neoplasm of breast We discussed the new guidelines on breast cancer screening and higher risk for false positive in women in their 75s.  Patient prefers to start screening now. - MM Digital Screening; Future  5. Need for hepatitis C screening test Patient agreeable to screening. - Hepatitis C Antibody     Patient was given the opportunity to ask questions.  Patient verbalized understanding of the plan and was able to repeat key elements of the plan.   This documentation was completed using Radio producer.  Any transcriptional errors are unintentional.  Orders Placed This Encounter  Procedures   POCT glucose (manual entry)     Requested Prescriptions    No prescriptions requested or ordered in this encounter    No follow-ups on file.  Karle Plumber, MD, FACP

## 2022-04-12 NOTE — Progress Notes (Signed)
Pt wants to discuss weight loss management.  

## 2022-04-12 NOTE — Patient Instructions (Signed)
Increase Trulicity to 1.5 mg daily. Decrease metformin to 500 mg once a day. After 2 to 3 weeks of the above changes, if your blood sugars remain good, you can stop the metformin completely.  Your blood pressure is not at goal today.  Try to check blood pressure at least once a week and record the readings.  Bring them with you on your next visit.  Try to limit the salt in the foods is much as possible.

## 2022-04-13 LAB — HEPATITIS C ANTIBODY: Hep C Virus Ab: NONREACTIVE

## 2022-04-15 ENCOUNTER — Other Ambulatory Visit (HOSPITAL_COMMUNITY): Payer: Self-pay

## 2022-04-29 ENCOUNTER — Other Ambulatory Visit (HOSPITAL_COMMUNITY): Payer: Self-pay

## 2022-05-14 ENCOUNTER — Encounter: Payer: Self-pay | Admitting: Internal Medicine

## 2022-05-14 ENCOUNTER — Other Ambulatory Visit (HOSPITAL_COMMUNITY)
Admission: RE | Admit: 2022-05-14 | Discharge: 2022-05-14 | Disposition: A | Discharge status: Home / Self Care | Payer: No Typology Code available for payment source | Source: Ambulatory Visit | Attending: Internal Medicine | Admitting: Internal Medicine

## 2022-05-14 ENCOUNTER — Ambulatory Visit: Payer: No Typology Code available for payment source | Attending: Internal Medicine | Admitting: Internal Medicine

## 2022-05-14 VITALS — BP 137/89 | HR 80 | Ht 67.0 in | Wt 275.4 lb

## 2022-05-14 DIAGNOSIS — Z124 Encounter for screening for malignant neoplasm of cervix: Secondary | ICD-10-CM | POA: Insufficient documentation

## 2022-05-14 NOTE — Progress Notes (Signed)
Patient ID: Valerie Moss, female    DOB: 1976-09-04  MRN: [id number]  CC: Gynecologic Exam   Subjective: Valerie Moss is a 46 y.o. female who presents for PAP Her concerns today include:  Pt with hx of DM type 2, HTN, obesity, insomnia, COVID-19 infection 2020 hosp x 1 wk, eczema  GYN History:  Pt is G1P1 Any hx of abn paps?: no Menses regular or irregular?: regular How long does menses last? 5-7 days Menstrual flow light or heavy?: light.  Method of birth control?:  IUD Mirena Any vaginal dischg at this time?:  no Dysuria?: no Any hx of STI?: no Sexually active with how many partners:  no.  Last sexually active in 2018 Desires STI screen: no, screened in June 2023 Last MMG: was called for MMG appt.  Will call back to set up.   Family hx of uterine, cervical or breast cancer?:  mother had uterine CA, and maternal GM has cervical CA.   Patient reports having some episodes sometimes at nights where she feels hot in the upper chest.  She wonders whether she is starting menopause.  HM:  decline flu shot today.  Will get at work     Patient Active Problem List   Diagnosis Date Noted   Hypertension associated with diabetes ([place]) 10/29/2021   Hyperlipidemia associated with type 2 diabetes mellitus ([place]) 10/29/2021   Type 2 diabetes mellitus with morbid obesity ([place]) 10/29/2021   Eczema 10/29/2021   Weight loss 09/23/2019   Hemoglobin A1C between 7% and 9% indicating borderline diabetic control ([place]) 09/23/2019   History of 2019 novel coronavirus disease (COVID-19) 09/23/2019   Pneumonia due to COVID-19 virus 08/18/2019   Hypoxia 08/18/2019   Type 2 diabetes mellitus without complication ([place]) 40/98/1191   COVID-19 08/17/2019   Cough 01/13/2019   Class 3 severe obesity due to excess calories with serious comorbidity and body mass index (BMI) of 45.0 to 49.9 in adult ([place]) 01/13/2019   Elevated glucose 01/13/2019   Elevated liver enzymes 01/13/2019   Anemia 01/13/2019    Hypocalcemia 01/13/2019   Pneumonia 12/26/2018   Tachypnea 12/26/2018   Leucocytosis 12/26/2018   Hypokalemia 12/26/2018   Hyponatremia 12/26/2018     Current Outpatient Medications on File Prior to Visit  Medication Sig Dispense Refill   amLODipine (NORVASC) 10 MG tablet Take 1 tablet (10 mg total) by mouth daily. 90 tablet 3   blood glucose meter kit and supplies Dispense based on patient and insurance preference. Use up to four times daily as directed. (FOR ICD-10 E10.9, E11.9). 1 each 0   Dulaglutide (TRULICITY) 1.5 YN/8.2NF SOPN Inject 1 pen (1.5 mg) into the skin once a week. 2 mL 4   metFORMIN (GLUCOPHAGE) 500 MG tablet Take 1 tablet by mouth daily with breakfast. 30 tablet 3   traZODone (DESYREL) 50 MG tablet Take 1 tablet (50 mg total) by mouth at bedtime. 30 tablet 6   triamcinolone cream (KENALOG) 0.1 % Apply 1 application topically 2 (two) times daily. 30 g 1   Vitamin D, Ergocalciferol, (DRISDOL) 1.25 MG (50000 UNIT) CAPS capsule Take 1 capsule (50,000 Units total) by mouth every 7 (seven) days. 5 capsule 6   No current facility-administered medications on file prior to visit.    Allergies  Allergen Reactions   Pineapple Other (See Comments)    Tongue burns   Lactose Intolerance (Gi)    Hydrochlorothiazide Other (See Comments)    Patient reports hair fell out    Social  History   Socioeconomic History   Marital status: Single    Spouse name: Not on file   Number of children: Not on file   Years of education: Not on file   Highest education level: Not on file  Occupational History   Not on file  Tobacco Use   Smoking status: Never   Smokeless tobacco: Never  Vaping Use   Vaping Use: Never used  Substance and Sexual Activity   Alcohol use: Never   Drug use: Never   Sexual activity: Not Currently    Partners: Male  Other Topics Concern   Not on file  Social History Narrative   Not on file   Social Determinants of Health   Financial Resource Strain:  Low Risk  (08/18/2019)   Overall Financial Resource Strain (CARDIA)    Difficulty of Paying Living Expenses: Not hard at all  Food Insecurity: No Food Insecurity (08/18/2019)   Hunger Vital Sign    Worried About Running Out of Food in the Last Year: Never true    [place] in the Last Year: Never true  Transportation Needs: No Transportation Needs (08/18/2019)   PRAPARE - Hydrologist (Medical): No    Lack of Transportation (Non-Medical): No  Physical Activity: Sufficiently Active (08/18/2019)   Exercise Vital Sign    Days of Exercise per Week: 7 days    Minutes of Exercise per Session: 150+ min  Stress: No Stress Concern Present (08/18/2019)   [place]    Feeling of Stress : Not at all  Social Connections: Socially Isolated (08/18/2019)   Social Connection and Isolation Panel [NHANES]    Frequency of Communication with Friends and Family: Twice a week    Frequency of Social Gatherings with Friends and Family: Never    Attends Religious Services: Never    Marine scientist or Organizations: No    Attends Archivist Meetings: Never    Marital Status: Never married  Intimate Partner Violence: Not At Risk (08/18/2019)   Humiliation, Afraid, Rape, and Kick questionnaire    Fear of Current or Ex-Partner: No    Emotionally Abused: No    Physically Abused: No    Sexually Abused: No    Family History  Problem Relation Age of Onset   Hypertension Mother    Diabetes Other     No past surgical history on file.  ROS: Review of Systems Negative except as stated above  PHYSICAL EXAM: BP 137/89   Pulse 80   Ht 5' 7"  (1.702 m)   Wt 275 lb 6.4 oz (124.9 kg)   SpO2 100%   BMI 43.13 kg/m   Physical Exam  General appearance - alert, well appearing, middle-age African-American female and in no distress Mental status - normal mood, behavior, speech, dress, motor activity,  and thought processes Pelvic -CMA Carly Mack present: Normal external genitalia, vulva, vagina, cervix, uterus and adnexa      Latest Ref Rng & Units 12/18/2021    9:06 AM 10/29/2021   10:24 AM 12/20/2019   10:28 AM  CMP  Glucose 70 - 99 mg/dL 118  158  294   BUN 6 - 24 mg/dL 9  10  10    Creatinine 0.57 - 1.00 mg/dL 0.66  0.70  0.78   Sodium 134 - 144 mmol/L 139  136  140   Potassium 3.5 - 5.2 mmol/L 4.2  4.4  4.7  Chloride 96 - 106 mmol/L 104  100  99   CO2 20 - 29 mmol/L 23  25  22    Calcium 8.7 - 10.2 mg/dL 9.4  9.3  9.8   Total Protein 6.0 - 8.5 g/dL  7.2  7.2   Total Bilirubin 0.0 - 1.2 mg/dL  1.0  0.5   Alkaline Phos 44 - 121 IU/L  69  78   AST 0 - 40 IU/L  13  9   ALT 0 - 32 IU/L  16  15    Lipid Panel     Component Value Date/Time   CHOL 183 10/29/2021 1024   TRIG 61 10/29/2021 1024   HDL 41 10/29/2021 1024   CHOLHDL 4.5 (H) 10/29/2021 1024   LDLCALC 130 (H) 10/29/2021 1024    CBC    Component Value Date/Time   WBC 6.0 10/29/2021 1024   WBC 14.7 (H) 08/22/2019 0723   RBC 5.16 10/29/2021 1024   RBC 5.19 (H) 08/22/2019 0723   HGB 13.5 10/29/2021 1024   HCT 41.8 10/29/2021 1024   PLT 388 10/29/2021 1024   MCV 81 10/29/2021 1024   MCH 26.2 (L) 10/29/2021 1024   MCH 26.6 08/22/2019 0723   MCHC 32.3 10/29/2021 1024   MCHC 31.9 08/22/2019 0723   RDW 11.3 (L) 10/29/2021 1024   LYMPHSABS 1.7 12/20/2019 1028   MONOABS 0.7 08/22/2019 0723   EOSABS 0.1 12/20/2019 1028   BASOSABS 0.0 12/20/2019 1028    ASSESSMENT AND PLAN:  1. Pap smear for cervical cancer screening We discussed signs and symptoms of perimenopause.  I told her that usually symptoms began in the late 62s to early 73s.  However she should keep an eye on how frequent she gets the hot sweats at nights.  Try to keep temperature comfortable in her room. - Cytology - PAP   Patient was given the opportunity to ask questions.  Patient verbalized understanding of the plan and was able to repeat key  elements of the plan.   This documentation was completed using Radio producer.  Any transcriptional errors are unintentional.  No orders of the defined types were placed in this encounter.    Requested Prescriptions    No prescriptions requested or ordered in this encounter    No follow-ups on file.  Karle Plumber, MD, FACP

## 2022-05-16 LAB — CYTOLOGY - PAP
Comment: NEGATIVE
Diagnosis: NEGATIVE
High risk HPV: NEGATIVE

## 2022-06-20 ENCOUNTER — Ambulatory Visit: Payer: 59 | Admitting: Sports Medicine

## 2022-06-20 VITALS — BP 140/80 | Ht 67.0 in | Wt 274.0 lb

## 2022-06-20 DIAGNOSIS — M76822 Posterior tibial tendinitis, left leg: Secondary | ICD-10-CM | POA: Diagnosis not present

## 2022-06-20 NOTE — Progress Notes (Signed)
    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 Pain and swelling is usually present throughout the day.  However patient reports noticing worsening stiffness with prolonged rest or first thing in the morning when she gets up from sleep. Right ankle pain and swelling improved when she changed her shoes from [employer] to the [employer]. She has also used Epson salt bath and provided mild relief  PERTINENT  PMH / PSH: Reviewed  OBJECTIVE:   BP (!) 140/80   Ht 5\' 7"  (1.702 m)   Wt 274 lb (124.3 kg)   BMI 42.91 kg/m    Physical Exam  General:  Alert, well appearing, NAD,  Left Ankle Mild effusion along the media melleolus .  No other gross deformity or ecchymoses. TTP along the media malleolus  FROM with normal strength. Negative anterior drawers or thompson test. Positive Calcaneus valgus  ASSESSMENT/PLAN:   Patient's left ankle pain is likely due to a posterior tibialis tendon dysfunction.  Provided patient with temporal orthotics and recommend continued use of knee and ankle compression sleeves.  Follow-up in 4 weeks reevaluation and consideration for demised orthotics  Alen Bleacher, MD [place]   Patient seen and evaluated with the resident.  I agree with the above plan of care.  Physical exam shows pes planus with calcaneal valgus bilaterally, left greater than right.  She is tender to palpation along the posterior tibialis tendon.  We will fit her with a body helix full ankle compression sleeve and fit her shoes with some green sports insoles with scaphoid pads.  She will return to the office in 4 weeks for reevaluation.  If she finds some relief with the scaphoid  pads then she would be a good candidate for custom orthotics.

## 2022-06-20 NOTE — Patient Instructions (Signed)
It was wonderful to meet you today. Thank you for allowing me to be a part of your care. Below is a short summary of what we discussed at your visit today:  Your left ankle pain is due to posterior tibialis tendon dysfunction.  We have provided you with temporal orthotics which you should wear until your next visit. Continue to use the knee and ankle pression sleeves.  Follow-up in 4 weeks to reassess if you would like a custom orthotics.  Please bring all of your medications to every appointment!  If you have any questions or concerns, please do not hesitate to contact us via phone or MyChart message.   Alen Bleacher, MD [facility]

## 2022-06-22 ENCOUNTER — Other Ambulatory Visit (HOSPITAL_COMMUNITY): Payer: Self-pay

## 2022-06-22 ENCOUNTER — Other Ambulatory Visit: Payer: Self-pay | Admitting: Internal Medicine

## 2022-06-24 ENCOUNTER — Other Ambulatory Visit (HOSPITAL_COMMUNITY): Payer: Self-pay

## 2022-06-24 MED ORDER — TRIAMCINOLONE ACETONIDE 0.1 % EX CREA
1.0000 | TOPICAL_CREAM | Freq: Two times a day (BID) | CUTANEOUS | 1 refills | Status: DC
  Filled 2022-06-24: qty 30, 15d supply, fill #0
  Filled 2022-07-23: qty 30, 15d supply, fill #1

## 2022-07-18 ENCOUNTER — Ambulatory Visit: Payer: 59 | Admitting: Sports Medicine

## 2022-07-23 ENCOUNTER — Other Ambulatory Visit (HOSPITAL_COMMUNITY): Payer: Self-pay

## 2022-07-23 ENCOUNTER — Ambulatory Visit: Payer: 59 | Admitting: Sports Medicine

## 2022-07-30 ENCOUNTER — Ambulatory Visit: Payer: 59 | Admitting: Sports Medicine

## 2022-09-03 ENCOUNTER — Other Ambulatory Visit: Payer: Self-pay

## 2022-09-03 ENCOUNTER — Other Ambulatory Visit: Payer: Self-pay | Admitting: Internal Medicine

## 2022-09-03 NOTE — Telephone Encounter (Signed)
Requested medication (s) are due for refill today: yes   Requested medication (s) are on the active medication list: yes  Last refill:  07/25/22 with 1 refill   Future visit scheduled: yes   Notes to clinic:  Please review for refill. Refill not delegated per protocol.     Requested Prescriptions  Pending Prescriptions Disp Refills   triamcinolone cream (KENALOG) 0.1 % 30 g 1    Sig: Apply topically 2 times daily.     Not Delegated - Dermatology:  Corticosteroids Failed - 09/03/2022  9:23 AM      Failed - This refill cannot be delegated      Passed - Valid encounter within last 12 months    Recent Outpatient Visits           3 months ago Pap smear for cervical cancer screening   [facility] Marcine Matar, MD   4 months ago Annual physical exam   [facility] Marcine Matar, MD   6 months ago Abdominal pain, left lower quadrant   [facility] Marcine Matar, MD   8 months ago Hypertension associated with diabetes [facility])   [facility] [place], Cornelius Moras, RPH-CPP   9 months ago Hypertension associated with diabetes [facility])   [facility] [place], Cornelius Moras, RPH-CPP       Future Appointments             In 2 weeks Marcine Matar, MD [facility]

## 2022-09-04 ENCOUNTER — Other Ambulatory Visit (HOSPITAL_COMMUNITY): Payer: Self-pay

## 2022-09-04 MED ORDER — TRIAMCINOLONE ACETONIDE 0.1 % EX CREA
1.0000 | TOPICAL_CREAM | Freq: Two times a day (BID) | CUTANEOUS | 1 refills | Status: DC
  Filled 2022-09-04: qty 30, 15d supply, fill #0
  Filled 2022-11-03 – 2022-11-16 (×2): qty 30, 15d supply, fill #1

## 2022-09-20 ENCOUNTER — Ambulatory Visit: Payer: 59 | Admitting: Internal Medicine

## 2022-09-30 ENCOUNTER — Other Ambulatory Visit: Payer: Self-pay | Admitting: Internal Medicine

## 2022-09-30 DIAGNOSIS — E1169 Type 2 diabetes mellitus with other specified complication: Secondary | ICD-10-CM

## 2022-09-30 DIAGNOSIS — I152 Hypertension secondary to endocrine disorders: Secondary | ICD-10-CM

## 2022-10-01 ENCOUNTER — Other Ambulatory Visit (HOSPITAL_COMMUNITY): Payer: Self-pay

## 2022-10-01 MED ORDER — AMLODIPINE BESYLATE 10 MG PO TABS
10.0000 mg | ORAL_TABLET | Freq: Every day | ORAL | 1 refills | Status: DC
  Filled 2022-10-01 – 2022-12-23 (×3): qty 90, 90d supply, fill #0
  Filled 2023-03-17: qty 90, 90d supply, fill #1

## 2022-10-01 MED ORDER — METFORMIN HCL 500 MG PO TABS
500.0000 mg | ORAL_TABLET | Freq: Every day | ORAL | 3 refills | Status: DC
  Filled 2022-10-01: qty 30, 30d supply, fill #0
  Filled 2022-11-03 – 2022-11-16 (×2): qty 30, 30d supply, fill #1
  Filled 2022-12-23: qty 30, 30d supply, fill #2
  Filled 2023-01-15 – 2023-02-28 (×3): qty 30, 30d supply, fill #3

## 2022-10-02 ENCOUNTER — Other Ambulatory Visit (HOSPITAL_COMMUNITY): Payer: Self-pay

## 2022-10-09 ENCOUNTER — Other Ambulatory Visit (HOSPITAL_COMMUNITY): Payer: Self-pay

## 2022-11-04 ENCOUNTER — Other Ambulatory Visit: Payer: Self-pay

## 2022-11-06 ENCOUNTER — Other Ambulatory Visit (HOSPITAL_COMMUNITY): Payer: Self-pay

## 2022-11-11 ENCOUNTER — Other Ambulatory Visit (HOSPITAL_COMMUNITY): Payer: Self-pay

## 2022-11-16 ENCOUNTER — Other Ambulatory Visit (HOSPITAL_COMMUNITY): Payer: Self-pay

## 2022-11-20 ENCOUNTER — Encounter: Payer: Self-pay | Admitting: Internal Medicine

## 2022-11-20 ENCOUNTER — Other Ambulatory Visit: Payer: Self-pay | Admitting: Internal Medicine

## 2022-12-01 ENCOUNTER — Other Ambulatory Visit: Payer: Self-pay

## 2022-12-02 ENCOUNTER — Other Ambulatory Visit (HOSPITAL_COMMUNITY): Payer: Self-pay

## 2022-12-10 ENCOUNTER — Other Ambulatory Visit (HOSPITAL_COMMUNITY): Payer: Self-pay

## 2022-12-23 ENCOUNTER — Other Ambulatory Visit (HOSPITAL_COMMUNITY): Payer: Self-pay

## 2022-12-23 ENCOUNTER — Other Ambulatory Visit: Payer: Self-pay | Admitting: Internal Medicine

## 2022-12-23 MED ORDER — TRULICITY 1.5 MG/0.5ML ~~LOC~~ SOAJ
1.5000 mg | SUBCUTANEOUS | 0 refills | Status: DC
Start: 2022-12-23 — End: 2023-03-03
  Filled 2022-12-23 – 2023-01-01 (×2): qty 2, 28d supply, fill #0

## 2022-12-24 ENCOUNTER — Other Ambulatory Visit (HOSPITAL_COMMUNITY): Payer: Self-pay

## 2023-01-01 ENCOUNTER — Other Ambulatory Visit (HOSPITAL_COMMUNITY): Payer: Self-pay

## 2023-01-03 ENCOUNTER — Other Ambulatory Visit: Payer: Self-pay

## 2023-01-13 ENCOUNTER — Other Ambulatory Visit: Payer: Self-pay

## 2023-01-15 ENCOUNTER — Other Ambulatory Visit (HOSPITAL_COMMUNITY): Payer: Self-pay

## 2023-01-15 ENCOUNTER — Other Ambulatory Visit: Payer: Self-pay | Admitting: Internal Medicine

## 2023-01-15 ENCOUNTER — Other Ambulatory Visit: Payer: Self-pay

## 2023-01-15 MED ORDER — TRIAMCINOLONE ACETONIDE 0.1 % EX CREA
1.0000 | TOPICAL_CREAM | Freq: Two times a day (BID) | CUTANEOUS | 0 refills | Status: DC
  Filled 2023-01-15 – 2023-02-28 (×3): qty 30, 15d supply, fill #0

## 2023-01-15 NOTE — Telephone Encounter (Signed)
Requested medication (s) are due for refill today: yes  Requested medication (s) are on the active medication list: yes  Last refill:  09/04/22 30 g 1 RF  Future visit scheduled: yes  Notes to clinic:  med not delegated to NT to RF   Requested Prescriptions  Pending Prescriptions Disp Refills   triamcinolone cream (KENALOG) 0.1 % 30 g 1    Sig: Apply topically 2 times daily.     Not Delegated - Dermatology:  Corticosteroids Failed - 01/15/2023 12:09 PM      Failed - This refill cannot be delegated      Passed - Valid encounter within last 12 months    Recent Outpatient Visits           8 months ago Pap smear for cervical cancer screening   [place] Marcine Matar, MD   9 months ago Annual physical exam   [facility] Jonah Blue B, MD   10 months ago Abdominal pain, left lower quadrant   [facility] Marcine Matar, MD   1 year ago Hypertension associated with diabetes [facility])   [facility] [place], Cornelius Moras, RPH-CPP   1 year ago Hypertension associated with diabetes [facility])   [place] [facility] Drucilla Chalet, RPH-CPP       Future Appointments             In 2 months Laural Benes, Binnie Rail, MD [facility]

## 2023-01-16 ENCOUNTER — Other Ambulatory Visit (HOSPITAL_COMMUNITY): Payer: Self-pay

## 2023-01-16 ENCOUNTER — Other Ambulatory Visit: Payer: Self-pay

## 2023-01-25 ENCOUNTER — Other Ambulatory Visit (HOSPITAL_COMMUNITY): Payer: Self-pay

## 2023-02-07 ENCOUNTER — Other Ambulatory Visit: Payer: Self-pay

## 2023-02-07 ENCOUNTER — Encounter: Payer: Self-pay | Admitting: Pharmacist

## 2023-02-12 ENCOUNTER — Other Ambulatory Visit: Payer: Self-pay

## 2023-02-28 ENCOUNTER — Other Ambulatory Visit: Payer: Self-pay | Admitting: Internal Medicine

## 2023-02-28 ENCOUNTER — Other Ambulatory Visit (HOSPITAL_COMMUNITY): Payer: Self-pay

## 2023-02-28 DIAGNOSIS — E1169 Type 2 diabetes mellitus with other specified complication: Secondary | ICD-10-CM

## 2023-03-01 ENCOUNTER — Other Ambulatory Visit (HOSPITAL_COMMUNITY): Payer: Self-pay

## 2023-03-03 ENCOUNTER — Other Ambulatory Visit (HOSPITAL_COMMUNITY): Payer: Self-pay

## 2023-03-03 ENCOUNTER — Other Ambulatory Visit: Payer: Self-pay

## 2023-03-03 ENCOUNTER — Other Ambulatory Visit: Payer: Self-pay | Admitting: Internal Medicine

## 2023-03-03 DIAGNOSIS — E1169 Type 2 diabetes mellitus with other specified complication: Secondary | ICD-10-CM

## 2023-03-03 NOTE — Telephone Encounter (Unsigned)
Copied from CRM [id number]. Topic: General - Other >> Mar 03, 2023 11:36 AM [name] wrote: Reason for CRM: Medication Refill - Medication: Rx #: [id number]  Dulaglutide (TRULICITY) 1.5 MG/0.5ML SOPN [[id number]]    Has the patient contacted their pharmacy? Yes.   (Agent: If no, request that the patient contact the pharmacy for the refill. If patient does not wish to contact the pharmacy document the reason why and proceed with request.) (Agent: If yes, when and what did the pharmacy advise?)  Preferred Pharmacy (with phone number or street name): [facility] [address] [place] [address] Phone: [phone] Fax: [phone] Hours: Mon-Fri 7:30am-6pm; Sat 8:00am-4:30pm   Has the patient been seen for an appointment in the last year OR does the patient have an upcoming appointment? Yes.    Agent: Please be advised that RX refills may take up to 3 business days. We ask that you follow-up with your pharmacy.

## 2023-03-04 ENCOUNTER — Other Ambulatory Visit (HOSPITAL_COMMUNITY): Payer: Self-pay

## 2023-03-04 MED ORDER — TRULICITY 1.5 MG/0.5ML ~~LOC~~ SOAJ
1.5000 mg | SUBCUTANEOUS | 0 refills | Status: DC
Start: 2023-03-04 — End: 2023-04-21
  Filled 2023-03-04: qty 2, 28d supply, fill #0

## 2023-03-04 NOTE — Telephone Encounter (Signed)
Requested medication (s) are due for refill today: yes  Requested medication (s) are on the active medication list: yes  Last refill:  12/23/22  Future visit scheduled: yes  Notes to clinic:  Unable to refill per protocol, courtesy refill already given, routing for provider approval.      Requested Prescriptions  Pending Prescriptions Disp Refills   Dulaglutide (TRULICITY) 1.5 MG/0.5ML SOPN 2 mL 0    Sig: Inject 1 pen (1.5 mg) into the skin once a week.     Endocrinology:  Diabetes - GLP-1 Receptor Agonists Failed - 03/03/2023 12:16 PM      Failed - HBA1C is between 0 and 7.9 and within 180 days    Hemoglobin A1C  Date Value Ref Range Status  03/04/2022 5.4 4.0 - 5.6 % Final   HbA1c, POC (prediabetic range)  Date Value Ref Range Status  06/19/2020 7.1 (A) 5.7 - 6.4 % Final   HbA1c, POC (controlled diabetic range)  Date Value Ref Range Status  10/29/2021 7.7 (A) 0.0 - 7.0 % Final         Failed - Valid encounter within last 6 months    Recent Outpatient Visits           9 months ago Pap smear for cervical cancer screening   [place] Marcine Matar, MD   10 months ago Annual physical exam   [facility] Marcine Matar, MD   1 year ago Abdominal pain, left lower quadrant   [place] Marcine Matar, MD   1 year ago Hypertension associated with diabetes [facility])   [facility] [place], Cornelius Moras, RPH-CPP   1 year ago Hypertension associated with diabetes [facility])   [place] [facility] Drucilla Chalet, RPH-CPP       Future Appointments             In 1 month Laural Benes, Binnie Rail, MD [facility] [place]

## 2023-03-05 ENCOUNTER — Other Ambulatory Visit (HOSPITAL_COMMUNITY): Payer: Self-pay

## 2023-03-11 ENCOUNTER — Other Ambulatory Visit: Payer: Self-pay | Admitting: Internal Medicine

## 2023-03-11 ENCOUNTER — Other Ambulatory Visit (HOSPITAL_COMMUNITY): Payer: Self-pay

## 2023-03-11 MED ORDER — TRIAMCINOLONE ACETONIDE 0.1 % EX CREA
1.0000 | TOPICAL_CREAM | Freq: Two times a day (BID) | CUTANEOUS | 0 refills | Status: DC
  Filled 2023-08-30: qty 30, 15d supply, fill #0

## 2023-03-26 ENCOUNTER — Other Ambulatory Visit (HOSPITAL_COMMUNITY): Payer: Self-pay

## 2023-03-26 ENCOUNTER — Other Ambulatory Visit: Payer: Self-pay | Admitting: Internal Medicine

## 2023-03-26 DIAGNOSIS — E1169 Type 2 diabetes mellitus with other specified complication: Secondary | ICD-10-CM

## 2023-03-26 MED ORDER — METFORMIN HCL 500 MG PO TABS
500.0000 mg | ORAL_TABLET | Freq: Every day | ORAL | 0 refills | Status: DC
Start: 2023-03-26 — End: 2023-04-21
  Filled 2023-03-26 – 2023-04-04 (×2): qty 30, 30d supply, fill #0

## 2023-04-03 ENCOUNTER — Other Ambulatory Visit (HOSPITAL_COMMUNITY): Payer: Self-pay

## 2023-04-04 ENCOUNTER — Other Ambulatory Visit (HOSPITAL_COMMUNITY): Payer: Self-pay

## 2023-04-04 ENCOUNTER — Ambulatory Visit: Payer: 59 | Admitting: Internal Medicine

## 2023-04-21 ENCOUNTER — Other Ambulatory Visit (HOSPITAL_COMMUNITY): Payer: Self-pay

## 2023-04-21 ENCOUNTER — Ambulatory Visit: Payer: 59 | Attending: Family Medicine | Admitting: Nurse Practitioner

## 2023-04-21 VITALS — BP 141/87 | HR 77 | Ht 67.0 in | Wt 282.2 lb

## 2023-04-21 DIAGNOSIS — E1169 Type 2 diabetes mellitus with other specified complication: Secondary | ICD-10-CM

## 2023-04-21 DIAGNOSIS — I152 Hypertension secondary to endocrine disorders: Secondary | ICD-10-CM

## 2023-04-21 DIAGNOSIS — Z7985 Long-term (current) use of injectable non-insulin antidiabetic drugs: Secondary | ICD-10-CM

## 2023-04-21 DIAGNOSIS — D649 Anemia, unspecified: Secondary | ICD-10-CM | POA: Diagnosis not present

## 2023-04-21 DIAGNOSIS — E559 Vitamin D deficiency, unspecified: Secondary | ICD-10-CM

## 2023-04-21 DIAGNOSIS — Z6841 Body Mass Index (BMI) 40.0 and over, adult: Secondary | ICD-10-CM

## 2023-04-21 DIAGNOSIS — Z1211 Encounter for screening for malignant neoplasm of colon: Secondary | ICD-10-CM

## 2023-04-21 DIAGNOSIS — E1159 Type 2 diabetes mellitus with other circulatory complications: Secondary | ICD-10-CM

## 2023-04-21 DIAGNOSIS — Z7984 Long term (current) use of oral hypoglycemic drugs: Secondary | ICD-10-CM

## 2023-04-21 DIAGNOSIS — Z1231 Encounter for screening mammogram for malignant neoplasm of breast: Secondary | ICD-10-CM

## 2023-04-21 MED ORDER — OZEMPIC (0.25 OR 0.5 MG/DOSE) 2 MG/3ML ~~LOC~~ SOPN
0.2500 mg | PEN_INJECTOR | SUBCUTANEOUS | 1 refills | Status: DC
Start: 2023-04-21 — End: 2023-07-25
  Filled 2023-04-21: qty 3, 28d supply, fill #0
  Filled 2023-05-16: qty 3, 28d supply, fill #1

## 2023-04-21 MED ORDER — METFORMIN HCL 500 MG PO TABS
500.0000 mg | ORAL_TABLET | Freq: Two times a day (BID) | ORAL | 1 refills | Status: DC
Start: 2023-04-21 — End: 2023-10-16
  Filled 2023-04-21: qty 180, 90d supply, fill #0
  Filled 2023-07-17: qty 180, 90d supply, fill #1

## 2023-04-21 NOTE — Progress Notes (Signed)
Assessment & Plan:  Valerie Moss was seen today for hypertension.  Diagnoses and all orders for this visit:  Hypertension associated with diabetes (HCC) -     CMP14+EGFR -     Urine Albumin/Creatinine with ratio (send out) [LAB689] Continue all antihypertensives as prescribed.  Reminded to bring in blood pressure log for follow  up appointment.  RECOMMENDATIONS: DASH/Mediterranean Diets are healthier choices for HTN.    Type 2 diabetes mellitus with morbid obesity (HCC) -     CMP14+EGFR -     Hemoglobin A1c -     Semaglutide,0.25 or 0.5MG /DOS, (OZEMPIC, 0.25 OR 0.5 MG/DOSE,) 2 MG/3ML SOPN; Inject 0.25 mg into the skin once a week. -     metFORMIN (GLUCOPHAGE) 500 MG tablet; Take 1 tablet (500 mg total) by mouth 2 (two) times daily with a meal. Continue blood sugar control as discussed in office today, low carbohydrate diet, and regular physical exercise as tolerated, 150 minutes per week (30 min each day, 5 days per week, or 50 min 3 days per week). Keep blood sugar logs with fasting goal of 90-130 mg/dl, post prandial (after you eat) less than 180.  For Hypoglycemia: BS <60 and Hyperglycemia BS >400; contact the clinic ASAP. Annual eye exams and foot exams are recommended.   Breast cancer screening by mammogram -     MM 3D SCREENING MAMMOGRAM BILATERAL BREAST; Future  Vitamin D deficiency disease -     VITAMIN D 25 Hydroxy (Vit-D Deficiency, Fractures)  Anemia, unspecified type -     CBC with Differential/Platelet  Colon cancer screening -     Ambulatory referral to Gastroenterology    Patient has been counseled on age-appropriate routine health concerns for screening and prevention. These are reviewed and up-to-date. Referrals have been placed accordingly. Immunizations are up-to-date or declined.    Subjective:   Chief Complaint  Patient presents with   Hypertension   HPI Valerie Moss 47 y.o. female presents to office today for follow up to HTN and DM.    HTN Stopped taking hydrochlorothiazide due to hair falling out. Blood pressure is elevated. She states her BP cuff at home is too small and readings are likely inaccurate. She is currently taking amlodipine 10 mg daily as prescribed. Would like to work on weight loss prior to adding an additional blood pressure medication.   BP Readings from Last 3 Encounters:  04/21/23 (!) 141/87  06/20/22 (!) 140/80  05/14/22 137/89    DM 2 Diabetes is well-controlled with Trulicity 1.5 mg weekly however she feels it is ineffective in suppressing her appetite.  She would like to switch to an alternative GLP-1.  I did instruct her that specifically Ozempic and Trulicity are not necessarily weight loss medications and the goal is for her diabetes to be well-controlled. Lab Results  Component Value Date   HGBA1C 5.4 03/04/2022    Wt Readings from Last 3 Encounters:  04/21/23 282 lb 3.2 oz (128 kg)  06/20/22 274 lb (124.3 kg)  05/14/22 275 lb 6.4 oz (124.9 kg)    Review of Systems  Constitutional:  Negative for fever, malaise/fatigue and weight loss.  HENT: Negative.  Negative for nosebleeds.   Eyes: Negative.  Negative for blurred vision, double vision and photophobia.  Respiratory: Negative.  Negative for cough and shortness of breath.   Cardiovascular: Negative.  Negative for chest pain, palpitations and leg swelling.  Gastrointestinal: Negative.  Negative for heartburn, nausea and vomiting.  Musculoskeletal: Negative.  Negative for myalgias.  Neurological: Negative.  Negative for dizziness, focal weakness, seizures and headaches.  Psychiatric/Behavioral: Negative.  Negative for suicidal ideas.     Past Medical History:  Diagnosis Date   Anemia    Arrhythmia    Coronavirus infection 08/17/2019   Cough    Diabetes mellitus without complication (HCC)    Eczema    Elevated liver enzymes    Hypertension    Hypocalcemia    Hypokalemia    Obese    Pneumonia    Vitamin D deficiency     No  past surgical history on file.  Family History  Problem Relation Age of Onset   Hypertension Mother    Diabetes Other     Social History Reviewed with no changes to be made today.   Outpatient Medications Prior to Visit  Medication Sig Dispense Refill   amLODipine (NORVASC) 10 MG tablet Take 1 tablet (10 mg) by mouth daily. 90 tablet 1   blood glucose meter kit and supplies Dispense based on patient and insurance preference. Use up to four times daily as directed. (FOR ICD-10 E10.9, E11.9). 1 each 0   triamcinolone cream (KENALOG) 0.1 % Apply topically 2 times daily. 30 g 0   Dulaglutide (TRULICITY) 1.5 MG/0.5ML SOPN Inject 1 pen (1.5 mg) into the skin once a week. 2 mL 0   metFORMIN (GLUCOPHAGE) 500 MG tablet Take 1 tablet by mouth daily with breakfast. 30 tablet 0   molnupiravir EUA 200 mg CAPS Take 4 capsules (800 mg total) by mouth 2 (two) times daily for 5 days. 40 capsule 0   traZODone (DESYREL) 50 MG tablet Take 1 tablet (50 mg total) by mouth at bedtime. 30 tablet 6   Vitamin D, Ergocalciferol, (DRISDOL) 1.25 MG (50000 UNIT) CAPS capsule Take 1 capsule (50,000 Units total) by mouth every 7 (seven) days. (Patient not taking: Reported on 04/21/2023) 5 capsule 6   No facility-administered medications prior to visit.    Allergies  Allergen Reactions   Pineapple Other (See Comments)    Tongue burns   Lactose Intolerance (Gi)    Hydrochlorothiazide Other (See Comments)    Patient reports hair fell out       Objective:    BP (!) 141/87   Pulse 77   Ht 5\' 7"  (1.702 m)   Wt 282 lb 3.2 oz (128 kg)   SpO2 100%   BMI 44.20 kg/m  Wt Readings from Last 3 Encounters:  04/21/23 282 lb 3.2 oz (128 kg)  06/20/22 274 lb (124.3 kg)  05/14/22 275 lb 6.4 oz (124.9 kg)    Physical Exam Vitals and nursing note reviewed.  Constitutional:      Appearance: She is well-developed.  HENT:     Head: Normocephalic and atraumatic.  Cardiovascular:     Rate and Rhythm: Normal rate and  regular rhythm.     Heart sounds: Normal heart sounds. No murmur heard.    No friction rub. No gallop.  Pulmonary:     Effort: Pulmonary effort is normal. No tachypnea or respiratory distress.     Breath sounds: Normal breath sounds. No decreased breath sounds, wheezing, rhonchi or rales.  Chest:     Chest wall: No tenderness.  Abdominal:     General: Bowel sounds are normal.     Palpations: Abdomen is soft.  Musculoskeletal:        General: Normal range of motion.     Cervical back: Normal range of motion.  Skin:    General: Skin  is warm and dry.  Neurological:     Mental Status: She is alert and oriented to person, place, and time.     Coordination: Coordination normal.  Psychiatric:        Behavior: Behavior normal. Behavior is cooperative.        Thought Content: Thought content normal.        Judgment: Judgment normal.          Patient has been counseled extensively about nutrition and exercise as well as the importance of adherence with medications and regular follow-up. The patient was given clear instructions to go to ER or return to medical center if symptoms don't improve, worsen or new problems develop. The patient verbalized understanding.   Follow-up: Return for f/u with PCP in early october for BP recheck.   Claiborne Rigg, FNP-BC [facility] [place], [place] [phone]   04/21/2023, 4:45 PM

## 2023-04-25 ENCOUNTER — Other Ambulatory Visit (HOSPITAL_COMMUNITY): Payer: Self-pay

## 2023-04-25 ENCOUNTER — Other Ambulatory Visit: Payer: Self-pay | Admitting: Nurse Practitioner

## 2023-04-25 DIAGNOSIS — E559 Vitamin D deficiency, unspecified: Secondary | ICD-10-CM

## 2023-04-25 MED ORDER — VITAMIN D (ERGOCALCIFEROL) 1.25 MG (50000 UNIT) PO CAPS
50000.0000 [IU] | ORAL_CAPSULE | ORAL | 0 refills | Status: AC
Start: 2023-04-25 — End: ?

## 2023-05-22 ENCOUNTER — Other Ambulatory Visit (HOSPITAL_COMMUNITY): Payer: Self-pay

## 2023-05-27 ENCOUNTER — Ambulatory Visit: Payer: 59 | Admitting: Nurse Practitioner

## 2023-06-14 ENCOUNTER — Other Ambulatory Visit: Payer: Self-pay | Admitting: Internal Medicine

## 2023-06-14 DIAGNOSIS — E1159 Type 2 diabetes mellitus with other circulatory complications: Secondary | ICD-10-CM

## 2023-06-16 ENCOUNTER — Other Ambulatory Visit (HOSPITAL_COMMUNITY): Payer: Self-pay

## 2023-06-16 MED ORDER — AMLODIPINE BESYLATE 10 MG PO TABS
10.0000 mg | ORAL_TABLET | Freq: Every day | ORAL | 0 refills | Status: DC
Start: 2023-06-16 — End: 2023-10-06
  Filled 2023-06-16 – 2023-07-10 (×3): qty 90, 90d supply, fill #0

## 2023-06-16 NOTE — Telephone Encounter (Signed)
Requested Prescriptions  Pending Prescriptions Disp Refills   amLODipine (NORVASC) 10 MG tablet 90 tablet 0    Sig: Take 1 tablet (10 mg) by mouth daily.     Cardiovascular: Calcium Channel Blockers 2 Failed - 06/14/2023  6:10 AM      Failed - Last BP in normal range    BP Readings from Last 1 Encounters:  04/21/23 (!) 141/87         Passed - Last Heart Rate in normal range    Pulse Readings from Last 1 Encounters:  04/21/23 77         Passed - Valid encounter within last 6 months    Recent Outpatient Visits           1 month ago Hypertension associated with diabetes [facility])   [place] [facility] [place], Shea Stakes, NP   1 year ago Pap smear for cervical cancer screening   [place] [facility] Marcine Matar, MD   1 year ago Annual physical exam   [facility] Marcine Matar, MD   1 year ago Abdominal pain, left lower quadrant   [facility] Marcine Matar, MD   1 year ago Hypertension associated with diabetes [facility])   [facility] [place], Cornelius Moras, RPH-CPP

## 2023-06-26 ENCOUNTER — Other Ambulatory Visit (HOSPITAL_COMMUNITY): Payer: Self-pay

## 2023-06-27 ENCOUNTER — Other Ambulatory Visit (HOSPITAL_COMMUNITY): Payer: Self-pay

## 2023-06-27 ENCOUNTER — Other Ambulatory Visit: Payer: Self-pay

## 2023-07-09 ENCOUNTER — Other Ambulatory Visit (HOSPITAL_COMMUNITY): Payer: Self-pay

## 2023-07-10 ENCOUNTER — Other Ambulatory Visit (HOSPITAL_COMMUNITY): Payer: Self-pay

## 2023-07-25 ENCOUNTER — Other Ambulatory Visit (HOSPITAL_COMMUNITY): Payer: Self-pay

## 2023-07-25 ENCOUNTER — Other Ambulatory Visit: Payer: Self-pay | Admitting: Nurse Practitioner

## 2023-07-25 DIAGNOSIS — E1169 Type 2 diabetes mellitus with other specified complication: Secondary | ICD-10-CM

## 2023-07-25 MED ORDER — OZEMPIC (0.25 OR 0.5 MG/DOSE) 2 MG/3ML ~~LOC~~ SOPN
0.2500 mg | PEN_INJECTOR | SUBCUTANEOUS | 1 refills | Status: DC
Start: 2023-07-25 — End: 2024-01-15
  Filled 2023-07-25: qty 3, 28d supply, fill #0
  Filled 2023-08-05 – 2023-08-30 (×2): qty 3, 56d supply, fill #0
  Filled 2023-10-16: qty 3, 28d supply, fill #0
  Filled 2023-11-19: qty 3, 28d supply, fill #1

## 2023-07-28 ENCOUNTER — Other Ambulatory Visit (HOSPITAL_COMMUNITY): Payer: Self-pay

## 2023-08-04 ENCOUNTER — Other Ambulatory Visit (HOSPITAL_COMMUNITY): Payer: Self-pay

## 2023-08-05 ENCOUNTER — Other Ambulatory Visit (HOSPITAL_COMMUNITY): Payer: Self-pay

## 2023-08-05 ENCOUNTER — Other Ambulatory Visit: Payer: Self-pay

## 2023-08-19 ENCOUNTER — Other Ambulatory Visit (HOSPITAL_COMMUNITY): Payer: Self-pay

## 2023-08-30 ENCOUNTER — Other Ambulatory Visit (HOSPITAL_COMMUNITY): Payer: Self-pay

## 2023-09-01 ENCOUNTER — Other Ambulatory Visit (HOSPITAL_COMMUNITY): Payer: Self-pay

## 2023-09-04 ENCOUNTER — Other Ambulatory Visit (HOSPITAL_COMMUNITY): Payer: Self-pay

## 2023-09-09 ENCOUNTER — Other Ambulatory Visit: Payer: Self-pay | Admitting: Internal Medicine

## 2023-09-09 ENCOUNTER — Other Ambulatory Visit (HOSPITAL_COMMUNITY): Payer: Self-pay

## 2023-09-09 MED ORDER — TRIAMCINOLONE ACETONIDE 0.1 % EX CREA: 1.0000 | TOPICAL_CREAM | Freq: Two times a day (BID) | CUTANEOUS | 0 refills | Status: DC

## 2023-09-22 ENCOUNTER — Other Ambulatory Visit (HOSPITAL_COMMUNITY): Payer: Self-pay

## 2023-10-06 ENCOUNTER — Other Ambulatory Visit: Payer: Self-pay | Admitting: Internal Medicine

## 2023-10-06 DIAGNOSIS — I152 Hypertension secondary to endocrine disorders: Secondary | ICD-10-CM

## 2023-10-07 ENCOUNTER — Other Ambulatory Visit (HOSPITAL_COMMUNITY): Payer: Self-pay

## 2023-10-07 MED ORDER — AMLODIPINE BESYLATE 10 MG PO TABS
10.0000 mg | ORAL_TABLET | Freq: Every day | ORAL | 0 refills | Status: DC
  Filled 2023-10-07: qty 30, 30d supply, fill #0

## 2023-10-07 NOTE — Telephone Encounter (Signed)
Requested medication (s) are due for refill today: yes   Requested medication (s) are on the active medication list: yes   Last refill:  06/16/23 #90 0 refills  Future visit scheduled: no   Notes to clinic:  no refills remain. Do you want to refill Rx?     Requested Prescriptions  Pending Prescriptions Disp Refills   amLODipine (NORVASC) 10 MG tablet 90 tablet 0    Sig: Take 1 tablet (10 mg) by mouth daily.     Cardiovascular: Calcium Channel Blockers 2 Failed - 10/07/2023 10:17 AM      Failed - Last BP in normal range    BP Readings from Last 1 Encounters:  04/21/23 (!) 141/87         Passed - Last Heart Rate in normal range    Pulse Readings from Last 1 Encounters:  04/21/23 77         Passed - Valid encounter within last 6 months    Recent Outpatient Visits           5 months ago Hypertension associated with diabetes (HCC)   [place] [facility] [place]. [facility] Claiborne Rigg, NP   1 year ago Pap smear for cervical cancer screening   [place] [facility] [place] [facility] [place]. [facility] Marcine Matar, MD   1 year ago Annual physical exam   [place] [facility] - [facility] [place]. [facility] Marcine Matar, MD   1 year ago Abdominal pain, left lower quadrant   [place] [facility] [place]. [facility] Marcine Matar, MD   1 year ago Hypertension associated with diabetes [facility])   [place] [facility] - [facility] [place]. [facility] Drucilla Chalet, RPH-CPP

## 2023-10-08 ENCOUNTER — Other Ambulatory Visit (HOSPITAL_COMMUNITY): Payer: Self-pay

## 2023-10-11 ENCOUNTER — Other Ambulatory Visit (HOSPITAL_COMMUNITY): Payer: Self-pay

## 2023-10-16 ENCOUNTER — Other Ambulatory Visit: Payer: Self-pay | Admitting: Nurse Practitioner

## 2023-10-16 ENCOUNTER — Other Ambulatory Visit (HOSPITAL_COMMUNITY): Payer: Self-pay

## 2023-10-16 DIAGNOSIS — E1169 Type 2 diabetes mellitus with other specified complication: Secondary | ICD-10-CM

## 2023-10-16 MED ORDER — METFORMIN HCL 500 MG PO TABS
500.0000 mg | ORAL_TABLET | Freq: Two times a day (BID) | ORAL | 0 refills | Status: DC
Start: 2023-10-16 — End: 2023-11-15
  Filled 2023-10-16: qty 60, 30d supply, fill #0

## 2023-10-17 ENCOUNTER — Other Ambulatory Visit (HOSPITAL_COMMUNITY): Payer: Self-pay

## 2023-11-04 ENCOUNTER — Other Ambulatory Visit: Payer: Self-pay | Admitting: Internal Medicine

## 2023-11-04 DIAGNOSIS — I152 Hypertension secondary to endocrine disorders: Secondary | ICD-10-CM

## 2023-11-05 ENCOUNTER — Other Ambulatory Visit (HOSPITAL_COMMUNITY): Payer: Self-pay

## 2023-11-05 MED ORDER — AMLODIPINE BESYLATE 10 MG PO TABS
10.0000 mg | ORAL_TABLET | Freq: Every day | ORAL | 0 refills | Status: DC
  Filled 2023-11-05 – 2023-11-18 (×2): qty 30, 30d supply, fill #0

## 2023-11-15 ENCOUNTER — Other Ambulatory Visit: Payer: Self-pay | Admitting: Internal Medicine

## 2023-11-17 ENCOUNTER — Other Ambulatory Visit (HOSPITAL_COMMUNITY): Payer: Self-pay

## 2023-11-17 MED ORDER — METFORMIN HCL 500 MG PO TABS
500.0000 mg | ORAL_TABLET | Freq: Two times a day (BID) | ORAL | 0 refills | Status: DC
  Filled 2023-11-17: qty 60, 30d supply, fill #0

## 2023-11-18 ENCOUNTER — Other Ambulatory Visit (HOSPITAL_COMMUNITY): Payer: Self-pay

## 2023-12-13 ENCOUNTER — Other Ambulatory Visit: Payer: Self-pay | Admitting: Internal Medicine

## 2023-12-13 DIAGNOSIS — I152 Hypertension secondary to endocrine disorders: Secondary | ICD-10-CM

## 2023-12-15 ENCOUNTER — Other Ambulatory Visit: Payer: Self-pay | Admitting: Internal Medicine

## 2023-12-15 ENCOUNTER — Other Ambulatory Visit (HOSPITAL_COMMUNITY): Payer: Self-pay

## 2023-12-15 ENCOUNTER — Other Ambulatory Visit: Payer: Self-pay

## 2023-12-15 MED ORDER — AMLODIPINE BESYLATE 10 MG PO TABS
10.0000 mg | ORAL_TABLET | Freq: Every day | ORAL | 0 refills | Status: DC
Start: 2023-12-15 — End: 2024-01-14
  Filled 2023-12-15: qty 30, 30d supply, fill #0

## 2023-12-15 MED ORDER — METFORMIN HCL 500 MG PO TABS
500.0000 mg | ORAL_TABLET | Freq: Two times a day (BID) | ORAL | 0 refills | Status: DC
  Filled 2023-12-15: qty 60, 30d supply, fill #0

## 2023-12-15 NOTE — Telephone Encounter (Signed)
 Requested Prescriptions  Pending Prescriptions Disp Refills   amLODipine (NORVASC) 10 MG tablet 30 tablet 0    Sig: Take 1 tablet (10 mg total) by mouth daily. Please schedule with Zelda for more refills!     Cardiovascular: Calcium Channel Blockers 2 Failed - 12/15/2023 11:37 AM      Failed - Last BP in normal range    BP Readings from Last 1 Encounters:  04/21/23 (!) 141/87         Failed - Valid encounter within last 6 months    Recent Outpatient Visits           7 months ago Hypertension associated with diabetes [facility])   [place] [facility] [place]. [facility] Claiborne Rigg, NP   1 year ago Pap smear for cervical cancer screening   [place] [facility] [place] [facility] [place]. [facility] Marcine Matar, MD   1 year ago Annual physical exam   [place] [facility] - [facility] [place]. [facility] Marcine Matar, MD   1 year ago Abdominal pain, left lower quadrant   [place] [facility] [place]. [facility] Marcine Matar, MD   1 year ago Hypertension associated with diabetes [facility])   [place] [facility] - [facility] [place]. [facility] Drucilla Chalet, RPH-CPP       Future Appointments             In 2 weeks Sharon Seller, [place] [facility] [place] [facility] - A Dept Of [place]. [facility]            Passed - Last Heart Rate in normal range    Pulse Readings from Last 1 Encounters:  04/21/23 77         Patient must keep upcoming appointment for additional refills.

## 2023-12-31 ENCOUNTER — Ambulatory Visit: Admitting: Physician Assistant

## 2024-01-13 ENCOUNTER — Other Ambulatory Visit: Payer: Self-pay | Admitting: Internal Medicine

## 2024-01-14 ENCOUNTER — Other Ambulatory Visit (HOSPITAL_COMMUNITY): Payer: Self-pay

## 2024-01-14 ENCOUNTER — Other Ambulatory Visit: Payer: Self-pay | Admitting: Internal Medicine

## 2024-01-14 DIAGNOSIS — E1159 Type 2 diabetes mellitus with other circulatory complications: Secondary | ICD-10-CM

## 2024-01-14 DIAGNOSIS — E1169 Type 2 diabetes mellitus with other specified complication: Secondary | ICD-10-CM

## 2024-01-14 MED ORDER — AMLODIPINE BESYLATE 10 MG PO TABS
10.0000 mg | ORAL_TABLET | Freq: Every day | ORAL | 0 refills | Status: DC
  Filled 2024-01-14: qty 30, 30d supply, fill #0

## 2024-01-14 MED ORDER — METFORMIN HCL 500 MG PO TABS
500.0000 mg | ORAL_TABLET | Freq: Two times a day (BID) | ORAL | 0 refills | Status: DC
  Filled 2024-01-14: qty 60, 30d supply, fill #0

## 2024-01-15 ENCOUNTER — Other Ambulatory Visit: Payer: Self-pay | Admitting: Internal Medicine

## 2024-01-15 DIAGNOSIS — E1169 Type 2 diabetes mellitus with other specified complication: Secondary | ICD-10-CM

## 2024-01-16 ENCOUNTER — Other Ambulatory Visit (HOSPITAL_COMMUNITY): Payer: Self-pay

## 2024-01-16 MED ORDER — OZEMPIC (0.25 OR 0.5 MG/DOSE) 2 MG/3ML ~~LOC~~ SOPN
0.2500 mg | PEN_INJECTOR | SUBCUTANEOUS | 1 refills | Status: DC
Start: 2024-01-16 — End: 2024-03-08
  Filled 2024-01-16: qty 3, 28d supply, fill #0
  Filled 2024-03-04: qty 3, 28d supply, fill #1

## 2024-02-03 ENCOUNTER — Other Ambulatory Visit: Payer: Self-pay

## 2024-02-13 ENCOUNTER — Other Ambulatory Visit: Payer: Self-pay | Admitting: Internal Medicine

## 2024-02-13 DIAGNOSIS — E1159 Type 2 diabetes mellitus with other circulatory complications: Secondary | ICD-10-CM

## 2024-02-13 DIAGNOSIS — E1169 Type 2 diabetes mellitus with other specified complication: Secondary | ICD-10-CM

## 2024-02-26 ENCOUNTER — Other Ambulatory Visit (HOSPITAL_COMMUNITY): Payer: Self-pay

## 2024-03-08 ENCOUNTER — Other Ambulatory Visit: Payer: Self-pay

## 2024-03-08 ENCOUNTER — Other Ambulatory Visit (HOSPITAL_COMMUNITY): Payer: Self-pay

## 2024-03-08 ENCOUNTER — Other Ambulatory Visit (HOSPITAL_BASED_OUTPATIENT_CLINIC_OR_DEPARTMENT_OTHER): Payer: Self-pay

## 2024-03-08 ENCOUNTER — Ambulatory Visit: Attending: Family Medicine | Admitting: Internal Medicine

## 2024-03-08 DIAGNOSIS — I10 Essential (primary) hypertension: Secondary | ICD-10-CM

## 2024-03-08 DIAGNOSIS — Z1211 Encounter for screening for malignant neoplasm of colon: Secondary | ICD-10-CM

## 2024-03-08 DIAGNOSIS — E119 Type 2 diabetes mellitus without complications: Secondary | ICD-10-CM

## 2024-03-08 DIAGNOSIS — E1169 Type 2 diabetes mellitus with other specified complication: Secondary | ICD-10-CM

## 2024-03-08 DIAGNOSIS — Z7984 Long term (current) use of oral hypoglycemic drugs: Secondary | ICD-10-CM | POA: Diagnosis not present

## 2024-03-08 DIAGNOSIS — R011 Cardiac murmur, unspecified: Secondary | ICD-10-CM | POA: Insufficient documentation

## 2024-03-08 DIAGNOSIS — Z7985 Long-term (current) use of injectable non-insulin antidiabetic drugs: Secondary | ICD-10-CM | POA: Diagnosis not present

## 2024-03-08 DIAGNOSIS — I152 Hypertension secondary to endocrine disorders: Secondary | ICD-10-CM

## 2024-03-08 DIAGNOSIS — Z6841 Body Mass Index (BMI) 40.0 and over, adult: Secondary | ICD-10-CM

## 2024-03-08 DIAGNOSIS — Z2821 Immunization not carried out because of patient refusal: Secondary | ICD-10-CM

## 2024-03-08 LAB — POCT GLYCOSYLATED HEMOGLOBIN (HGB A1C): HbA1c, POC (controlled diabetic range): 7.4 % — AB (ref 0.0–7.0)

## 2024-03-08 LAB — GLUCOSE, POCT (MANUAL RESULT ENTRY): POC Glucose: 188 mg/dL — AB (ref 70–99)

## 2024-03-08 MED ORDER — SEMAGLUTIDE(0.25 OR 0.5MG/DOS) 2 MG/3ML ~~LOC~~ SOPN
0.5000 mg | PEN_INJECTOR | SUBCUTANEOUS | 0 refills | Status: DC
  Filled 2024-03-08: qty 3, fill #0
  Filled 2024-03-10: qty 3, 28d supply, fill #0

## 2024-03-08 MED ORDER — METFORMIN HCL 500 MG PO TABS
500.0000 mg | ORAL_TABLET | Freq: Two times a day (BID) | ORAL | 1 refills | Status: DC
  Filled 2024-03-08: qty 180, 90d supply, fill #0
  Filled 2024-06-02 – 2024-06-15 (×2): qty 180, 90d supply, fill #1

## 2024-03-08 MED ORDER — AMLODIPINE BESYLATE 10 MG PO TABS
10.0000 mg | ORAL_TABLET | Freq: Every day | ORAL | 1 refills | Status: DC
  Filled 2024-03-08: qty 90, 90d supply, fill #0
  Filled 2024-06-02 – 2024-06-14 (×2): qty 90, 90d supply, fill #1

## 2024-03-08 MED ORDER — ATORVASTATIN CALCIUM 10 MG PO TABS
10.0000 mg | ORAL_TABLET | Freq: Every day | ORAL | 1 refills | Status: DC
  Filled 2024-03-08: qty 90, 90d supply, fill #0
  Filled 2024-06-02 – 2024-06-15 (×2): qty 90, 90d supply, fill #1

## 2024-03-08 MED ORDER — VALSARTAN 40 MG PO TABS
40.0000 mg | ORAL_TABLET | Freq: Every day | ORAL | 3 refills | Status: AC
  Filled 2024-03-08: qty 90, 90d supply, fill #0
  Filled 2024-06-02 – 2024-06-15 (×2): qty 90, 90d supply, fill #1
  Filled 2024-09-13 – 2024-09-14 (×2): qty 90, 90d supply, fill #2

## 2024-03-08 NOTE — Patient Instructions (Signed)
 VISIT SUMMARY:  Today, we discussed your diabetes, hypertension, and general health maintenance. We reviewed your current medications and made some adjustments to better manage your conditions. We also talked about the importance of diet, exercise, and regular monitoring of your health metrics.  YOUR PLAN:  -TYPE 2 DIABETES MELLITUS: Type 2 diabetes is a condition where your body does not use insulin  properly, leading to high blood sugar levels. Your A1c level is currently 7.4%, which indicates that your diabetes is not well-controlled. We have increased your Ozempic  dose to 0.5 mg weekly. Please send a MyChart message or call after one month on this dose if you are tolerating it well, so we can consider increasing it to 1 mg. Additionally, try to plan your meals to reduce fast food intake and aim for 150 minutes of moderate exercise per week.  -HYPERTENSION: Hypertension, or high blood pressure, is when the force of your blood against your artery walls is too high. Your recent blood pressure reading was 141/87, which is above the target of less than 130/80. We have added Diovan  40 mg daily to your medication regimen. Please try to reduce your sodium intake and increase your consumption of fruits and vegetables.  -HYPERLIPIDEMIA: Hyperlipidemia is when you have high levels of fats (lipids) in your blood, which increases your risk of heart disease. We are planning to start you on atorvastatin 10 mg daily, but you should wait for the results of your liver function tests before starting this medication.  -HEART MURMUR: A heart murmur is an unusual sound heard between heartbeats. You reported arrhythmia without symptoms. We will monitor for any symptoms such as shortness of breath or leg swelling. If these symptoms develop, we may consider an echocardiogram.  -GENERAL HEALTH MAINTENANCE: We discussed the importance of regular health screenings and vaccinations. You are due for a diabetic eye exam and a  colonoscopy for colon cancer screening due to your family history. We also talked about the pneumonia vaccine, which you declined for today. Please update your hepatitis B vaccination records when you have the dates.  INSTRUCTIONS:  Please follow up with a MyChart message or call after one month on the increased Ozempic  dose. Additionally, schedule your diabetic eye exam and colonoscopy as discussed.

## 2024-03-08 NOTE — Progress Notes (Signed)
 Patient ID: Valerie Moss, female    DOB: [birth year]  MRN: [id number]  CC: Diabetes (DM f/u. Med refill. Layvonne Ozempic  dosage increase/No to pneumonia vax)   Subjective: Valerie Moss is a 48 y.o. female who presents for chronic ds management. Her concerns today include:  Pt with hx of DM type 2, HTN, obesity, insomnia, COVID-19 infection 2020 hosp x 1 wk, eczema   Discussed the use of AI scribe software for clinical note transcription with the patient, who gave verbal consent to proceed.  History of Present Illness Valerie Moss is a 48 year old female with diabetes and hypertension who presents for follow-up.  DM: Results for orders placed or performed in visit on 03/08/24  POCT glucose (manual entry)   Collection Time: 03/08/24  8:47 AM  Result Value Ref Range   POC Glucose 188 (A) 70 - 99 mg/dl  POCT glycosylated hemoglobin (Hb A1C)   Collection Time: 03/08/24  8:48 AM  Result Value Ref Range   Hemoglobin A1C     HbA1c POC (<> result, manual entry)     HbA1c, POC (prediabetic range)     HbA1c, POC (controlled diabetic range) 7.4 (A) 0.0 - 7.0 %  She is on metformin  500 mg twice daily and has been out of Ozempic  0.25 mg for a week. Her last A1c was 7.4, and her blood sugar was 88. She is not checking her blood sugars regularly despite having a device. Her diet avoids sugary items but includes fast food due to a busy and stressful schedule. There is no decrease in appetite or weight loss since starting Ozempic  in September; her weight increased from 174 lbs to 281 lbs. Her physical activity has decreased since January due to a change in her job role. She is attending classes at the [employer] from 8:00 AM to 4:30 PM with a 30-minute lunch break. 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 She uses a Programmer, systems to track her activity. -due for DM eye exam. -not on statin therapy.  HTN: For  hypertension, she is on amlodipine  10 mg daily and checks her blood pressure once a week. Her recent blood pressure was 141/87. She experiences increased stress due to her grandmother's passing and hectic classes. She tries to limit salt intake but acknowledges hidden sodium in fast food. She consumes fresh fruits and vegetables sporadically, preferring frozen vegetables. She has no chest pain, shortness of breath, or swelling in the legs. She reports history of arrhythmia without associated symptoms.   HM:  Due for colon CA screen. Her family history includes a half-brother diagnosed with colon cancer at age 80.  She reports having had 2 hepatitis B vaccines recently through employee health.    Patient Active Problem List   Diagnosis Date Noted   Hypertension associated with diabetes (HCC) 10/29/2021   Hyperlipidemia associated with type 2 diabetes mellitus (HCC) 10/29/2021   Type 2 diabetes mellitus with morbid obesity (HCC) 10/29/2021   Eczema 10/29/2021   Weight loss 09/23/2019   Hemoglobin A1C between 7% and 9% indicating borderline diabetic control (HCC) 09/23/2019   History of 2019 novel coronavirus disease (COVID-19) 09/23/2019   Pneumonia due to COVID-19 virus 08/18/2019   Hypoxia 08/18/2019   Type 2 diabetes mellitus without complication (HCC) 08/18/2019   COVID-19 08/17/2019   Cough 01/13/2019   Class 3 severe obesity due to excess calories with serious comorbidity and body mass index (BMI) of 45.0  to 49.9 in adult 01/13/2019   Elevated glucose 01/13/2019   Elevated liver enzymes 01/13/2019   Anemia 01/13/2019   Hypocalcemia 01/13/2019   Pneumonia 12/26/2018   Tachypnea 12/26/2018   Leucocytosis 12/26/2018   Hypokalemia 12/26/2018   Hyponatremia 12/26/2018     Current Outpatient Medications on File Prior to Visit  Medication Sig Dispense Refill   amLODipine  (NORVASC ) 10 MG tablet Take 1 tablet (10 mg total) by mouth daily. Please schedule with Zelda for more refills! 30  tablet 0   blood glucose meter kit and supplies Dispense based on patient and insurance preference. Use up to four times daily as directed. (FOR ICD-10 E10.9, E11.9). 1 each 0   metFORMIN  (GLUCOPHAGE ) 500 MG tablet Take 1 tablet (500 mg total) by mouth 2 (two) times daily with a meal. Please make PCP appt for more refills. 60 tablet 0   Semaglutide ,0.25 or 0.5MG /DOS, (OZEMPIC , 0.25 OR 0.5 MG/DOSE,) 2 MG/3ML SOPN Inject 0.25 mg into the skin once a week. 3 mL 1   triamcinolone  cream (KENALOG ) 0.1 % Apply topically 2 times daily. 30 g 0   Vitamin D , Ergocalciferol , (DRISDOL ) 1.25 MG (50000 UNIT) CAPS capsule Take 1 capsule (50,000 Units total) by mouth every 7 (seven) days. Please fill 90-day supply. 12 capsule 0   No current facility-administered medications on file prior to visit.    Allergies  Allergen Reactions   Pineapple Other (See Comments)    Tongue burns   Lactose Intolerance (Gi)    Hydrochlorothiazide  Other (See Comments)    Patient reports hair fell out    Social History   Socioeconomic History   Marital status: Single    Spouse name: Not on file   Number of children: Not on file   Years of education: Not on file   Highest education level: Associate degree: occupational, Scientist, product/process development, or vocational program  Occupational History   Not on file  Tobacco Use   Smoking status: Never   Smokeless tobacco: Never  Vaping Use   Vaping status: Never Used  Substance and Sexual Activity   Alcohol use: Never   Drug use: Never   Sexual activity: Not Currently    Partners: Male  Other Topics Concern   Not on file  Social History Narrative   Not on file   Social Drivers of Health   Financial Resource Strain: Low Risk  (03/08/2024)   Overall Financial Resource Strain (CARDIA)    Difficulty of Paying Living Expenses: Not very hard  Food Insecurity: Food Insecurity Present (03/08/2024)   Hunger Vital Sign    Worried About Running Out of Food in the Last Year: Sometimes true     Ran Out of Food in the Last Year: Sometimes true  Transportation Needs: No Transportation Needs (03/08/2024)   PRAPARE - Administrator, Civil Service (Medical): No    Lack of Transportation (Non-Medical): No  Physical Activity: Sufficiently Active (04/21/2023)   Exercise Vital Sign    Days of Exercise per Week: 5 days    Minutes of Exercise per Session: 150+ min  Stress: No Stress Concern Present (04/21/2023)   [employer] of Occupational Health - Occupational Stress Questionnaire    Feeling of Stress : Not at all  Social Connections: Unknown (03/08/2024)   Social Connection and Isolation Panel    Frequency of Communication with Friends and Family: More than three times a week    Frequency of Social Gatherings with Friends and Family: Twice a week  Attends Religious Services: 1 to 4 times per year    Active Member of Clubs or Organizations: Patient declined    Attends Banker Meetings: Not on file    Marital Status: Never married  Intimate Partner Violence: Not At Risk (08/18/2019)   Humiliation, Afraid, Rape, and Kick questionnaire    Fear of Current or Ex-Partner: No    Emotionally Abused: No    Physically Abused: No    Sexually Abused: No    Family History  Problem Relation Age of Onset   Hypertension Mother    Diabetes Other     No past surgical history on file.  ROS: Review of Systems Negative except as stated above  PHYSICAL EXAM: BP 135/85 (BP Location: Left Arm, Patient Position: Sitting, Cuff Size: Normal)   Pulse 93   Temp 98.4 F (36.9 C) (Oral)   Ht 5' 7 (1.702 m)   Wt 281 lb (127.5 kg)   SpO2 99%   BMI 44.01 kg/m   Wt Readings from Last 3 Encounters:  03/08/24 281 lb (127.5 kg)  04/21/23 282 lb 3.2 oz (128 kg)  06/20/22 274 lb (124.3 kg)    Wt Readings from Last 3 Encounters:  03/08/24 281 lb (127.5 kg)  04/21/23 282 lb 3.2 oz (128 kg)  06/20/22 274 lb (124.3 kg)    Physical Exam General appearance - alert, well  appearing, middle-age obese African-American female and in no distress Mental status - normal mood, behavior, speech, dress, motor activity, and thought processes Neck - supple, no significant adenopathy Chest - clear to auscultation, no wheezes, rales or rhonchi, symmetric air entry Heart - normal rate, regular rhythm, normal S1, S2. 2/6 SEM LUSB Extremities - peripheral pulses normal, no pedal edema, no clubbing or cyanosis Diabetic Foot Exam - Simple   Simple Foot Form Diabetic Foot exam was performed with the following findings: Yes 03/08/2024  9:20 AM  Visual Inspection No deformities, no ulcerations, no other skin breakdown bilaterally: Yes Sensation Testing Intact to touch and monofilament testing bilaterally: Yes Pulse Check Posterior Tibialis and Dorsalis pulse intact bilaterally: Yes Comments    Results for orders placed or performed in visit on 03/08/24  POCT glucose (manual entry)   Collection Time: 03/08/24  8:47 AM  Result Value Ref Range   POC Glucose 188 (A) 70 - 99 mg/dl  POCT glycosylated hemoglobin (Hb A1C)   Collection Time: 03/08/24  8:48 AM  Result Value Ref Range   Hemoglobin A1C     HbA1c POC (<> result, manual entry)     HbA1c, POC (prediabetic range)     HbA1c, POC (controlled diabetic range) 7.4 (A) 0.0 - 7.0 %        Latest Ref Rng & Units 04/21/2023    9:50 AM 12/18/2021    9:06 AM 10/29/2021   10:24 AM  CMP  Glucose 70 - 99 mg/dL 878  881  841   BUN 6 - 24 mg/dL 10  9  10    Creatinine 0.57 - 1.00 mg/dL 9.27  9.33  9.29   Sodium 134 - 144 mmol/L 140  139  136   Potassium 3.5 - 5.2 mmol/L 4.0  4.2  4.4   Chloride 96 - 106 mmol/L 102  104  100   CO2 20 - 29 mmol/L 22  23  25    Calcium 8.7 - 10.2 mg/dL 9.3  9.4  9.3   Total Protein 6.0 - 8.5 g/dL 7.3   7.2   Total  Bilirubin 0.0 - 1.2 mg/dL 0.7   1.0   Alkaline Phos 44 - 121 IU/L 60   69   AST 0 - 40 IU/L 20   13   ALT 0 - 32 IU/L 30   16    Lipid Panel     Component Value Date/Time   CHOL  183 10/29/2021 1024   TRIG 61 10/29/2021 1024   HDL 41 10/29/2021 1024   CHOLHDL 4.5 (H) 10/29/2021 1024   LDLCALC 130 (H) 10/29/2021 1024    CBC    Component Value Date/Time   WBC 5.0 04/21/2023 0950   WBC 14.7 (H) 08/22/2019 0723   RBC 4.79 04/21/2023 0950   RBC 5.19 (H) 08/22/2019 0723   HGB 13.1 04/21/2023 0950   HCT 40.6 04/21/2023 0950   PLT 384 04/21/2023 0950   MCV 85 04/21/2023 0950   MCH 27.3 04/21/2023 0950   MCH 26.6 08/22/2019 0723   MCHC 32.3 04/21/2023 0950   MCHC 31.9 08/22/2019 0723   RDW 11.8 04/21/2023 0950   LYMPHSABS 1.6 04/21/2023 0950   MONOABS 0.7 08/22/2019 0723   EOSABS 0.1 04/21/2023 0950   BASOSABS 0.0 04/21/2023 0950    ASSESSMENT AND PLAN: 1. Type 2 diabetes mellitus with morbid obesity (HCC) (Primary) Not at goal. No weight loss or appetite suppression reported with starting dose Ozempic . Goal A1c <7%. - Increase Ozempic  to 0.5 mg weekly. - Instruct her to send a MyChart message or call after one month on 0.5 mg if tolerating well to increase to 1 mg. Continue Metformin  500 mg BID - Encourage meal planning and reducing fast food intake. - Encourage physical activity, aiming for 150 minutes of moderate intensity exercise per week. -Discussed recommendation for all diabetics to be on statin therapy to help reduce cardiovascular risk.  She is willing to start atorvastatin.  I told her that we will check baseline LFTs today.  Went over potential side effects of statins including effects on the liver and muscle cramps/muscle aches.  Advised to stop the medicine if she develops any severe cramps.  She will hold off on starting the medicine until we get the results back from labs that were ordered today. - POCT glycosylated hemoglobin (Hb A1C) - POCT glucose (manual entry) - metFORMIN  (GLUCOPHAGE ) 500 MG tablet; Take 1 tablet (500 mg total) by mouth 2 (two) times daily with a meal. Please make PCP appt for more refills.  Dispense: 180 tablet; Refill:  1 - CBC - Comprehensive metabolic panel with GFR - Lipid panel - Semaglutide ,0.25 or 0.5MG /DOS, 2 MG/3ML SOPN; Inject 0.5 mg into the skin once a week.  Dispense: 3 mL; Refill: 0 - Ambulatory referral to Ophthalmology - atorvastatin (LIPITOR) 10 MG tablet; Take 1 tablet (10 mg total) by mouth daily.  Dispense: 90 tablet; Refill: 1  2. Diabetes mellitus treated with oral medication (HCC) 3. Long-term (current) use of injectable non-insulin  antidiabetic drugs See #1 above.  4. Hypertension associated with diabetes (HCC) Blood pressure readings elevated, currently on maximum dose of amlodipine . Goal is <130/80 mmHg. Stress factors noted. - Add Diovan  40 mg daily. - Encourage dietary modifications to reduce sodium intake and increase fruit and vegetable consumption. - amLODipine  (NORVASC ) 10 MG tablet; Take 1 tablet (10 mg total) by mouth daily. Please schedule with Zelda for more refills!  Dispense: 90 tablet; Refill: 1 - valsartan  (DIOVAN ) 40 MG tablet; Take 1 tablet (40 mg total) by mouth daily.  Dispense: 90 tablet; Refill: 3  5. Heart murmur  Asymptomatic at this time without shortness of breath or swelling in the legs.  Consider echo in the future  6. Colon cancer screening Given family history of half brother diagnosed with colon cancer at age 77, I recommend doing colonoscopy rather than cologuard - Ambulatory referral to Gastroenterology  7. Pneumococcal vaccination declined  She will send us  information via MyChart of the dates that she received her 2 hepatitis B vaccines through employee health.  Patient was given the opportunity to ask questions.  Patient verbalized understanding of the plan and was able to repeat key elements of the plan.   This documentation was completed using Paediatric nurse.  Any transcriptional errors are unintentional.  Orders Placed This Encounter  Procedures   POCT glycosylated hemoglobin (Hb A1C)   POCT glucose (manual entry)      Requested Prescriptions   Pending Prescriptions Disp Refills   amLODipine  (NORVASC ) 10 MG tablet 90 tablet 1    Sig: Take 1 tablet (10 mg total) by mouth daily. Please schedule with Zelda for more refills!   metFORMIN  (GLUCOPHAGE ) 500 MG tablet 180 tablet 1    Sig: Take 1 tablet (500 mg total) by mouth 2 (two) times daily with a meal. Please make PCP appt for more refills.    No follow-ups on file.  Barnie Louder, MD, FACP

## 2024-03-09 ENCOUNTER — Other Ambulatory Visit (HOSPITAL_COMMUNITY): Payer: Self-pay

## 2024-03-09 ENCOUNTER — Ambulatory Visit: Payer: Self-pay | Admitting: Internal Medicine

## 2024-03-09 LAB — LIPID PANEL
Chol/HDL Ratio: 4.3 ratio (ref 0.0–4.4)
Cholesterol, Total: 207 mg/dL — ABNORMAL HIGH (ref 100–199)
HDL: 48 mg/dL (ref >39)
LDL Chol Calc (NIH): 140 mg/dL — ABNORMAL HIGH (ref 0–99)
Triglycerides: 108 mg/dL (ref 0–149)
VLDL Cholesterol Cal: 19 mg/dL (ref 5–40)

## 2024-03-09 LAB — COMPREHENSIVE METABOLIC PANEL WITH GFR
ALT: 22 IU/L (ref 0–32)
AST: 11 IU/L (ref 0–40)
Albumin: 4.5 g/dL (ref 3.9–4.9)
Alkaline Phosphatase: 77 IU/L (ref 44–121)
BUN/Creatinine Ratio: 14 (ref 9–23)
BUN: 11 mg/dL (ref 6–24)
Bilirubin Total: 0.6 mg/dL (ref 0.0–1.2)
CO2: 20 mmol/L (ref 20–29)
Calcium: 9.9 mg/dL (ref 8.7–10.2)
Chloride: 99 mmol/L (ref 96–106)
Creatinine, Ser: 0.76 mg/dL (ref 0.57–1.00)
Globulin, Total: 2.9 g/dL (ref 1.5–4.5)
Glucose: 174 mg/dL — ABNORMAL HIGH (ref 70–99)
Potassium: 4.5 mmol/L (ref 3.5–5.2)
Sodium: 136 mmol/L (ref 134–144)
Total Protein: 7.4 g/dL (ref 6.0–8.5)
eGFR: 98 mL/min/{1.73_m2} (ref >59)

## 2024-03-09 LAB — CBC
Hematocrit: 43.1 % (ref 34.0–46.6)
Hemoglobin: 13.6 g/dL (ref 11.1–15.9)
MCH: 27.4 pg (ref 26.6–33.0)
MCHC: 31.6 g/dL (ref 31.5–35.7)
MCV: 87 fL (ref 79–97)
Platelets: 390 10*3/uL (ref 150–450)
RBC: 4.96 x10E6/uL (ref 3.77–5.28)
RDW: 11.6 % — ABNORMAL LOW (ref 11.7–15.4)
WBC: 6.5 10*3/uL (ref 3.4–10.8)

## 2024-03-10 ENCOUNTER — Other Ambulatory Visit (HOSPITAL_COMMUNITY): Payer: Self-pay

## 2024-03-11 ENCOUNTER — Other Ambulatory Visit (HOSPITAL_COMMUNITY): Payer: Self-pay

## 2024-03-19 ENCOUNTER — Other Ambulatory Visit: Payer: Self-pay

## 2024-04-06 ENCOUNTER — Other Ambulatory Visit: Payer: Self-pay | Admitting: Internal Medicine

## 2024-04-06 DIAGNOSIS — Z1231 Encounter for screening mammogram for malignant neoplasm of breast: Secondary | ICD-10-CM

## 2024-04-07 ENCOUNTER — Encounter: Payer: Self-pay | Admitting: Internal Medicine

## 2024-04-08 ENCOUNTER — Other Ambulatory Visit: Payer: Self-pay | Admitting: Internal Medicine

## 2024-04-08 MED ORDER — SEMAGLUTIDE (1 MG/DOSE) 4 MG/3ML ~~LOC~~ SOPN
1.0000 mg | PEN_INJECTOR | SUBCUTANEOUS | 3 refills | Status: DC
  Filled 2024-04-08: qty 3, 28d supply, fill #0
  Filled 2024-04-30 – 2024-05-03 (×2): qty 3, 28d supply, fill #1
  Filled 2024-06-02 – 2024-06-14 (×2): qty 3, 28d supply, fill #2
  Filled 2024-07-05 – 2024-07-07 (×2): qty 3, 28d supply, fill #3

## 2024-04-09 ENCOUNTER — Other Ambulatory Visit (HOSPITAL_COMMUNITY): Payer: Self-pay

## 2024-04-16 ENCOUNTER — Ambulatory Visit
Admission: RE | Admit: 2024-04-16 | Discharge: 2024-04-16 | Disposition: A | Discharge status: Home / Self Care | Source: Ambulatory Visit

## 2024-04-16 DIAGNOSIS — Z1231 Encounter for screening mammogram for malignant neoplasm of breast: Secondary | ICD-10-CM

## 2024-04-21 ENCOUNTER — Other Ambulatory Visit: Payer: Self-pay | Admitting: Internal Medicine

## 2024-04-21 ENCOUNTER — Ambulatory Visit: Payer: Self-pay | Admitting: Internal Medicine

## 2024-04-21 DIAGNOSIS — R928 Other abnormal and inconclusive findings on diagnostic imaging of breast: Secondary | ICD-10-CM

## 2024-04-30 ENCOUNTER — Other Ambulatory Visit (HOSPITAL_COMMUNITY): Payer: Self-pay

## 2024-04-30 ENCOUNTER — Ambulatory Visit
Admission: RE | Admit: 2024-04-30 | Discharge: 2024-04-30 | Disposition: A | Discharge status: Home / Self Care | Source: Ambulatory Visit | Attending: Internal Medicine | Admitting: Internal Medicine

## 2024-04-30 DIAGNOSIS — R928 Other abnormal and inconclusive findings on diagnostic imaging of breast: Secondary | ICD-10-CM

## 2024-05-01 ENCOUNTER — Ambulatory Visit: Payer: Self-pay | Admitting: Internal Medicine

## 2024-05-03 ENCOUNTER — Other Ambulatory Visit: Payer: Self-pay | Admitting: Internal Medicine

## 2024-05-03 DIAGNOSIS — N6489 Other specified disorders of breast: Secondary | ICD-10-CM

## 2024-06-14 ENCOUNTER — Other Ambulatory Visit (HOSPITAL_COMMUNITY): Payer: Self-pay

## 2024-06-24 ENCOUNTER — Other Ambulatory Visit (HOSPITAL_COMMUNITY): Payer: Self-pay

## 2024-07-05 ENCOUNTER — Other Ambulatory Visit (HOSPITAL_COMMUNITY): Payer: Self-pay

## 2024-07-08 ENCOUNTER — Ambulatory Visit: Admitting: Internal Medicine

## 2024-07-16 ENCOUNTER — Ambulatory Visit: Admitting: Internal Medicine

## 2024-07-17 ENCOUNTER — Other Ambulatory Visit (HOSPITAL_COMMUNITY): Payer: Self-pay

## 2024-07-30 ENCOUNTER — Observation Stay (HOSPITAL_COMMUNITY)
Admission: EM | Admit: 2024-07-30 | Discharge: 2024-08-01 | Disposition: A | Discharge status: Home / Self Care | Source: Ambulatory Visit | Attending: General Surgery | Admitting: General Surgery

## 2024-07-30 ENCOUNTER — Other Ambulatory Visit (HOSPITAL_COMMUNITY): Payer: Self-pay

## 2024-07-30 ENCOUNTER — Other Ambulatory Visit: Payer: Self-pay

## 2024-07-30 ENCOUNTER — Emergency Department (HOSPITAL_COMMUNITY)

## 2024-07-30 ENCOUNTER — Ambulatory Visit

## 2024-07-30 ENCOUNTER — Encounter (HOSPITAL_COMMUNITY): Payer: Self-pay | Admitting: General Surgery

## 2024-07-30 DIAGNOSIS — Z79899 Other long term (current) drug therapy: Secondary | ICD-10-CM | POA: Insufficient documentation

## 2024-07-30 DIAGNOSIS — E119 Type 2 diabetes mellitus without complications: Secondary | ICD-10-CM | POA: Insufficient documentation

## 2024-07-30 DIAGNOSIS — Z7984 Long term (current) use of oral hypoglycemic drugs: Secondary | ICD-10-CM | POA: Diagnosis not present

## 2024-07-30 DIAGNOSIS — K8012 Calculus of gallbladder with acute and chronic cholecystitis without obstruction: Secondary | ICD-10-CM | POA: Diagnosis not present

## 2024-07-30 DIAGNOSIS — I10 Essential (primary) hypertension: Secondary | ICD-10-CM | POA: Insufficient documentation

## 2024-07-30 DIAGNOSIS — K8050 Calculus of bile duct without cholangitis or cholecystitis without obstruction: Secondary | ICD-10-CM | POA: Diagnosis present

## 2024-07-30 DIAGNOSIS — K8020 Calculus of gallbladder without cholecystitis without obstruction: Principal | ICD-10-CM

## 2024-07-30 DIAGNOSIS — R1013 Epigastric pain: Secondary | ICD-10-CM | POA: Diagnosis present

## 2024-07-30 LAB — CBC WITH DIFFERENTIAL/PLATELET
Abs Immature Granulocytes: 0.03 K/uL (ref 0.00–0.07)
Basophils Absolute: 0 K/uL (ref 0.0–0.1)
Basophils Relative: 0 %
Eosinophils Absolute: 0 K/uL (ref 0.0–0.5)
Eosinophils Relative: 0 %
HCT: 39.8 % (ref 36.0–46.0)
Hemoglobin: 12.8 g/dL (ref 12.0–15.0)
Immature Granulocytes: 0 %
Lymphocytes Relative: 12 %
Lymphs Abs: 0.8 K/uL (ref 0.7–4.0)
MCH: 27.8 pg (ref 26.0–34.0)
MCHC: 32.2 g/dL (ref 30.0–36.0)
MCV: 86.3 fL (ref 80.0–100.0)
Monocytes Absolute: 0.2 K/uL (ref 0.1–1.0)
Monocytes Relative: 3 %
Neutro Abs: 5.9 K/uL (ref 1.7–7.7)
Neutrophils Relative %: 85 %
Platelets: 406 K/uL — ABNORMAL HIGH (ref 150–400)
RBC: 4.61 MIL/uL (ref 3.87–5.11)
RDW: 12.1 % (ref 11.5–15.5)
WBC: 7.1 K/uL (ref 4.0–10.5)
nRBC: 0 % (ref 0.0–0.2)

## 2024-07-30 LAB — GLUCOSE, CAPILLARY: Glucose-Capillary: 87 mg/dL (ref 70–99)

## 2024-07-30 LAB — COMPREHENSIVE METABOLIC PANEL WITH GFR
ALT: 23 U/L (ref 0–44)
AST: 24 U/L (ref 15–41)
Albumin: 4.6 g/dL (ref 3.5–5.0)
Alkaline Phosphatase: 99 U/L (ref 38–126)
Anion gap: 9 (ref 5–15)
BUN: 8 mg/dL (ref 6–20)
CO2: 26 mmol/L (ref 22–32)
Calcium: 9.4 mg/dL (ref 8.9–10.3)
Chloride: 99 mmol/L (ref 98–111)
Creatinine, Ser: 0.66 mg/dL (ref 0.44–1.00)
GFR, Estimated: >60 mL/min (ref >60)
Glucose, Bld: 161 mg/dL — ABNORMAL HIGH (ref 70–99)
Potassium: 3.7 mmol/L (ref 3.5–5.1)
Sodium: 134 mmol/L — ABNORMAL LOW (ref 135–145)
Total Bilirubin: 0.9 mg/dL (ref 0.0–1.2)
Total Protein: 8.5 g/dL — ABNORMAL HIGH (ref 6.5–8.1)

## 2024-07-30 LAB — LIPASE, BLOOD: Lipase: 16 U/L (ref 11–51)

## 2024-07-30 LAB — HCG, SERUM, QUALITATIVE: Preg, Serum: NEGATIVE

## 2024-07-30 MED ORDER — MELATONIN 3 MG PO TABS: 3.0000 mg | ORAL_TABLET | Freq: Every evening | ORAL | Status: DC | PRN

## 2024-07-30 MED ORDER — FENTANYL CITRATE (PF) 50 MCG/ML IJ SOSY
25.0000 ug | PREFILLED_SYRINGE | Freq: Once | INTRAMUSCULAR | Status: AC
  Administered 2024-07-30: 25 ug via INTRAVENOUS
  Filled 2024-07-30: qty 1

## 2024-07-30 MED ORDER — DIPHENHYDRAMINE HCL 50 MG/ML IJ SOLN: 25.0000 mg | Freq: Four times a day (QID) | INTRAMUSCULAR | Status: DC | PRN

## 2024-07-30 MED ORDER — INSULIN ASPART 100 UNIT/ML IJ SOLN: 0.0000 [IU] | INTRAMUSCULAR | Status: DC | PRN

## 2024-07-30 MED ORDER — FAMOTIDINE IN NACL 20-0.9 MG/50ML-% IV SOLN
20.0000 mg | Freq: Once | INTRAVENOUS | Status: AC
  Administered 2024-07-30: 20 mg via INTRAVENOUS
  Filled 2024-07-30: qty 50

## 2024-07-30 MED ORDER — SIMETHICONE 80 MG PO CHEW: 40.0000 mg | CHEWABLE_TABLET | Freq: Four times a day (QID) | ORAL | Status: DC | PRN

## 2024-07-30 MED ORDER — SODIUM CHLORIDE 0.9 % IV SOLN
2.0000 g | INTRAVENOUS | Status: DC
  Administered 2024-07-31: 2 g via INTRAVENOUS

## 2024-07-30 MED ORDER — ACETAMINOPHEN 500 MG PO TABS: 1000.0000 mg | ORAL_TABLET | ORAL | Status: DC

## 2024-07-30 MED ORDER — PROPOFOL 10 MG/ML IV BOLUS
INTRAVENOUS | Status: AC
  Filled 2024-07-30: qty 20

## 2024-07-30 MED ORDER — MORPHINE SULFATE (PF) 2 MG/ML IV SOLN: 1.0000 mg | INTRAVENOUS | Status: DC | PRN

## 2024-07-30 MED ORDER — DIPHENHYDRAMINE HCL 25 MG PO CAPS
25.0000 mg | ORAL_CAPSULE | Freq: Four times a day (QID) | ORAL | Status: DC | PRN
Start: 2024-07-30 — End: 2024-08-01

## 2024-07-30 MED ORDER — SODIUM CHLORIDE 0.9 % IV BOLUS
1000.0000 mL | Freq: Once | INTRAVENOUS | Status: AC
  Administered 2024-07-30: 1000 mL via INTRAVENOUS

## 2024-07-30 MED ORDER — KCL IN DEXTROSE-NACL 20-5-0.45 MEQ/L-%-% IV SOLN
INTRAVENOUS | Status: AC
  Filled 2024-07-30: qty 1000

## 2024-07-30 MED ORDER — HYDRALAZINE HCL 20 MG/ML IJ SOLN: 10.0000 mg | INTRAMUSCULAR | Status: DC | PRN

## 2024-07-30 MED ORDER — MORPHINE SULFATE (PF) 4 MG/ML IV SOLN
4.0000 mg | Freq: Once | INTRAVENOUS | Status: AC
  Administered 2024-07-30: 4 mg via INTRAVENOUS
  Filled 2024-07-30: qty 1

## 2024-07-30 MED ORDER — ONDANSETRON 4 MG PO TBDP
4.0000 mg | ORAL_TABLET | Freq: Three times a day (TID) | ORAL | 0 refills | Status: AC | PRN
  Filled 2024-07-30: qty 20, 7d supply, fill #0

## 2024-07-30 MED ORDER — INDOCYANINE GREEN 25 MG IV SOLR: 1.2500 mg | INTRAVENOUS | Status: DC

## 2024-07-30 MED ORDER — OXYCODONE HCL 5 MG PO TABS: 5.0000 mg | ORAL_TABLET | ORAL | Status: DC | PRN

## 2024-07-30 MED ORDER — SCOPOLAMINE 1 MG/3DAYS TD PT72: 1.0000 | MEDICATED_PATCH | TRANSDERMAL | Status: DC

## 2024-07-30 MED ORDER — INDOCYANINE GREEN 25 MG IV SOLR
1.2500 mg | Freq: Once | INTRAVENOUS | Status: AC
  Administered 2024-07-30: 1.25 mg via INTRAVENOUS
  Filled 2024-07-30: qty 10

## 2024-07-30 MED ORDER — METOPROLOL TARTRATE 5 MG/5ML IV SOLN: 5.0000 mg | Freq: Four times a day (QID) | INTRAVENOUS | Status: DC | PRN

## 2024-07-30 MED ORDER — SODIUM CHLORIDE 0.9 % IV SOLN: 2.0000 g | INTRAVENOUS | Status: DC

## 2024-07-30 MED ORDER — MIDAZOLAM HCL 2 MG/2ML IJ SOLN
INTRAMUSCULAR | Status: AC
  Filled 2024-07-30: qty 2

## 2024-07-30 MED ORDER — AMLODIPINE BESYLATE 10 MG PO TABS
10.0000 mg | ORAL_TABLET | Freq: Every day | ORAL | Status: DC
  Administered 2024-07-30 – 2024-08-01 (×2): 10 mg via ORAL
  Filled 2024-07-30 (×2): qty 1

## 2024-07-30 MED ORDER — ENOXAPARIN SODIUM 40 MG/0.4ML IJ SOSY
40.0000 mg | PREFILLED_SYRINGE | INTRAMUSCULAR | Status: DC
  Administered 2024-07-30 – 2024-07-31 (×2): 40 mg via SUBCUTANEOUS
  Filled 2024-07-30 (×2): qty 0.4

## 2024-07-30 MED ORDER — INSULIN ASPART 100 UNIT/ML IJ SOLN
0.0000 [IU] | Freq: Three times a day (TID) | INTRAMUSCULAR | Status: DC
  Administered 2024-07-31 (×2): 3 [IU] via SUBCUTANEOUS
  Filled 2024-07-30 (×2): qty 3

## 2024-07-30 MED ORDER — IRBESARTAN 75 MG PO TABS
37.5000 mg | ORAL_TABLET | Freq: Every day | ORAL | Status: DC
  Administered 2024-07-30 – 2024-08-01 (×2): 37.5 mg via ORAL
  Filled 2024-07-30 (×2): qty 1

## 2024-07-30 MED ORDER — ACETAMINOPHEN 500 MG PO TABS
1000.0000 mg | ORAL_TABLET | Freq: Four times a day (QID) | ORAL | Status: DC
  Administered 2024-07-31 – 2024-08-01 (×4): 1000 mg via ORAL
  Filled 2024-07-30 (×5): qty 2

## 2024-07-30 MED ORDER — ONDANSETRON HCL 4 MG/2ML IJ SOLN
4.0000 mg | Freq: Once | INTRAMUSCULAR | Status: AC
  Administered 2024-07-30: 4 mg via INTRAVENOUS
  Filled 2024-07-30: qty 2

## 2024-07-30 MED ORDER — SUCRALFATE 1 GM/10ML PO SUSP
1.0000 g | Freq: Once | ORAL | Status: AC
  Administered 2024-07-30: 1 g via ORAL
  Filled 2024-07-30: qty 10

## 2024-07-30 MED ORDER — ONDANSETRON HCL 4 MG/2ML IJ SOLN
4.0000 mg | INTRAMUSCULAR | Status: DC | PRN
Start: 2024-07-30 — End: 2024-08-01
  Administered 2024-07-31 (×2): 4 mg via INTRAVENOUS
  Filled 2024-07-30 (×2): qty 2

## 2024-07-30 MED ORDER — FENTANYL CITRATE (PF) 250 MCG/5ML IJ SOLN
INTRAMUSCULAR | Status: AC
  Filled 2024-07-30: qty 5

## 2024-07-30 NOTE — ED Triage Notes (Addendum)
 C/o epigatric pain starting this morning  - radiating to both upper quadrants. Worse when touched. Seen at [facility] - sent here this morning for further eval. UC gave 4mg  ODT Zofran 

## 2024-07-30 NOTE — Anesthesia Preprocedure Evaluation (Addendum)
 Anesthesia Evaluation  Patient identified by MRN, date of birth, ID band Patient awake    Reviewed: Allergy & Precautions, H&P , NPO status , Patient's Chart, lab work & pertinent test results  Airway Mallampati: II  TM Distance: >3 FB Neck ROM: Full    Dental  (+) Dental Advisory Given   Pulmonary neg pulmonary ROS   Pulmonary exam normal breath sounds clear to auscultation       Cardiovascular hypertension, Pt. on medications negative cardio ROS Normal cardiovascular exam+ Valvular Problems/Murmurs  Rhythm:Regular Rate:Normal     Neuro/Psych negative neurological ROS  negative psych ROS   GI/Hepatic negative GI ROS, Neg liver ROS,,,  Endo/Other  diabetes, Type 2, Oral Hypoglycemic Agents  Class 3 obesity  Renal/GU negative Renal ROS  negative genitourinary   Musculoskeletal negative musculoskeletal ROS (+)    Abdominal  (+) + obese  Peds negative pediatric ROS (+)  Hematology negative hematology ROS (+) Blood dyscrasia, anemia   Anesthesia Other Findings   Reproductive/Obstetrics negative OB ROS                              Anesthesia Physical Anesthesia Plan  ASA: 3  Anesthesia Plan: General   Post-op Pain Management:    Induction: Intravenous, Rapid sequence and Cricoid pressure planned  PONV Risk Score and Plan: 3 and Ondansetron , Dexamethasone , Midazolam  and Treatment may vary due to age or medical condition  Airway Management Planned: Oral ETT  Additional Equipment: None  Intra-op Plan:   Post-operative Plan: Extubation in OR  Informed Consent:   Plan Discussed with: CRNA  Anesthesia Plan Comments:          Anesthesia Quick Evaluation

## 2024-07-30 NOTE — Progress Notes (Signed)
 Short Stay/ Pre OP nursing note: MD in to speak with pt, informed client that surgical procedure will be done tomorrow am instead of this evening. Pt understanding of decision to postpone surgical procedure. Admission orders rec from [facility], informed pt of admission orders and expected plan of care for this evening and tonight. Pt informed family herself by phone of change in surgery schedule. Phone handoff report provided to rec RN on assigned unit. Client tx to assigned med surg unit/ bed.

## 2024-07-30 NOTE — Discharge Instructions (Signed)

## 2024-07-30 NOTE — H&P (Signed)
 Valerie Moss 03-25-1976  [id number].     Chief Complaint/Reason for Consult: Biliary colic  HPI:  This is a pleasant 48 yo female with a history of DM and HTN who has been using Ozempic  for just over the last year.  She woke up this morning around 0300am with epigastric abdominal pain that radiated across her upper abdomen.  She developed some nausea and one episode of emesis.  She has never had pain like this before.  Her last dose of Ozempic  was last Saturday.  She has never had pain like this with her injections in the past.  She presented to the ED for evaluation and found to have normal LFTs, normal WBC, but an US  that reveals a 2.3cm gallstone in the gallbladder neck.  No definitive evidence of cholecystitis.  She continued to have some pain and we were asked to see her.  ROS: ROS: see HPI  Family History  Problem Relation Age of Onset   Hypertension Mother    Breast cancer Other 12 - 89   Breast cancer Other 70 - 79   Diabetes Other     Past Medical History:  Diagnosis Date   Anemia    Arrhythmia    Coronavirus infection 08/17/2019   Cough    Diabetes mellitus without complication (HCC)    Eczema    Elevated liver enzymes    Hypertension    Hypocalcemia    Hypokalemia    Obese    Pneumonia    Vitamin D  deficiency     History reviewed. No pertinent surgical history.  Social History:  reports that she has never smoked. She has never used smokeless tobacco. She reports that she does not drink alcohol and does not use drugs.  Allergies:  Allergies  Allergen Reactions   Pineapple Other (See Comments)    Tongue burns   Lactose Intolerance (Gi)    Hydrochlorothiazide  Other (See Comments)    Patient reports hair fell out    Medications Prior to Admission  Medication Sig Dispense Refill   amLODipine  (NORVASC ) 10 MG tablet Take 1 tablet (10 mg total) by mouth daily. Please schedule with Zelda for more refills! 90 tablet 1   atorvastatin  (LIPITOR) 10 MG  tablet Take 1 tablet (10 mg total) by mouth daily. 90 tablet 1   blood glucose meter kit and supplies Dispense based on patient and insurance preference. Use up to four times daily as directed. (FOR ICD-10 E10.9, E11.9). 1 each 0   metFORMIN  (GLUCOPHAGE ) 500 MG tablet Take 1 tablet (500 mg total) by mouth 2 (two) times daily with a meal. Please make PCP appt for more refills. 180 tablet 1   ondansetron  (ZOFRAN -ODT) 4 MG disintegrating tablet Take 1 tablet (4 mg total) by mouth every 8 (eight) hours as needed for nausea or vomiting. 20 tablet 0   Semaglutide , 1 MG/DOSE, 4 MG/3ML SOPN Inject 1 mg as directed once a week. 3 mL 3   triamcinolone  cream (KENALOG ) 0.1 % Apply topically 2 times daily. 30 g 0   valsartan  (DIOVAN ) 40 MG tablet Take 1 tablet (40 mg total) by mouth daily. 90 tablet 3   Vitamin D , Ergocalciferol , (DRISDOL ) 1.25 MG (50000 UNIT) CAPS capsule Take 1 capsule (50,000 Units total) by mouth every 7 (seven) days. Please fill 90-day supply. 12 capsule 0     Physical Exam: Blood pressure (!) 152/86, pulse 91, temperature 97.8 F (36.6 C), resp. rate 16, SpO2 100%. General: pleasant, WD, WN,  obese female who is laying in bed in NAD HEENT: head is normocephalic, atraumatic.  Sclera are noninjected.  PERRL.  Ears and nose without any masses or lesions.  Mouth is pink and moist Heart: regular, rate, and rhythm.  Normal s1,s2. No obvious murmurs, gallops, or rubs noted. Lungs: CTAB, no wheezes, rhonchi, or rales noted.  Respiratory effort nonlabored Abd: soft, tender greatest in her epigastrium, ND, but obese, +BS, no masses, hernias, or organomegaly Psych: A&Ox3 with an appropriate affect.   Results for orders placed or performed during the hospital encounter of 07/30/24 (from the past 48 hours)  Comprehensive metabolic panel     Status: Abnormal   Collection Time: 07/30/24  8:54 AM  Result Value Ref Range   Sodium 134 (L) 135 - 145 mmol/L   Potassium 3.7 3.5 - 5.1 mmol/L    Chloride 99 98 - 111 mmol/L   CO2 26 22 - 32 mmol/L   Glucose, Bld 161 (H) 70 - 99 mg/dL    Comment: Glucose reference range applies only to samples taken after fasting for at least 8 hours.   BUN 8 6 - 20 mg/dL   Creatinine, Ser 9.33 0.44 - 1.00 mg/dL   Calcium  9.4 8.9 - 10.3 mg/dL   Total Protein 8.5 (H) 6.5 - 8.1 g/dL   Albumin 4.6 3.5 - 5.0 g/dL   AST 24 15 - 41 U/L    Comment: HEMOLYSIS AT THIS LEVEL MAY AFFECT RESULT   ALT 23 0 - 44 U/L   Alkaline Phosphatase 99 38 - 126 U/L   Total Bilirubin 0.9 0.0 - 1.2 mg/dL   GFR, Estimated >39 >39 mL/min    Comment: (NOTE) Calculated using the CKD-EPI Creatinine Equation (2021)    Anion gap 9 5 - 15    Comment: Performed at [facility], [address], [place], [place] [address]  Lipase, blood     Status: None   Collection Time: 07/30/24  8:54 AM  Result Value Ref Range   Lipase 16 11 - 51 U/L    Comment: Performed at [facility], [address], [place], [place] [address]  CBC with Differential     Status: Abnormal   Collection Time: 07/30/24  8:54 AM  Result Value Ref Range   WBC 7.1 4.0 - 10.5 K/uL   RBC 4.61 3.87 - 5.11 MIL/uL   Hemoglobin 12.8 12.0 - 15.0 g/dL   HCT 60.1 63.9 - 53.9 %   MCV 86.3 80.0 - 100.0 fL   MCH 27.8 26.0 - 34.0 pg   MCHC 32.2 30.0 - 36.0 g/dL   RDW 87.8 88.4 - 84.4 %   Platelets 406 (H) 150 - 400 K/uL   nRBC 0.0 0.0 - 0.2 %   Neutrophils Relative % 85 %   Neutro Abs 5.9 1.7 - 7.7 K/uL   Lymphocytes Relative 12 %   Lymphs Abs 0.8 0.7 - 4.0 K/uL   Monocytes Relative 3 %   Monocytes Absolute 0.2 0.1 - 1.0 K/uL   Eosinophils Relative 0 %   Eosinophils Absolute 0.0 0.0 - 0.5 K/uL   Basophils Relative 0 %   Basophils Absolute 0.0 0.0 - 0.1 K/uL   Immature Granulocytes 0 %   Abs Immature Granulocytes 0.03 0.00 - 0.07 K/uL    Comment: Performed at [facility], [address] [place], [place] [address]  hCG, serum, qualitative     Status:  None   Collection Time: 07/30/24  8:54  AM  Result Value Ref Range   Preg, Serum NEGATIVE NEGATIVE    Comment:        THE SENSITIVITY OF THIS METHODOLOGY IS >10 mIU/mL. Performed at [facility], [address], [place], [place] [address]    US  Abdomen Limited RUQ (LIVER/GB) Result Date: 07/30/2024 CLINICAL DATA:  [id number] Abdominal pain [id number] EXAM: ULTRASOUND ABDOMEN LIMITED RIGHT UPPER QUADRANT COMPARISON:  None Available. FINDINGS: Gallbladder: There is a 2.3 cm cholelithiasis within the gallbladder neck. No wall thickening or pericholecystic fluid. No sonographic Murphy sign noted by sonographer. Common bile duct: Diameter: Visualized portion measures 5 mm, within normal limits. Liver: No focal lesion identified. Within normal limits in parenchymal echogenicity. Portal vein is patent on color Doppler imaging with normal direction of blood flow towards the liver. Other: None. IMPRESSION: There is a 2.3 cm cholelithiasis within the gallbladder neck. No sonographic evidence of acute cholecystitis. Electronically Signed   By: Corean Salter M.D.   On: 07/30/2024 11:29      Assessment/Plan Biliary colic The patient has been seen, examined, labs, vitals, chart, and imaging personally reviewed.  She has a large gallstone essentially lodged in the neck of her gallbladder.  I suspect this will continued to cause her pain and issues moving forward.  We discussed operative management for this as well as not doing anything right now.  She would like to proceed with surgical intervention due to the high risk of recurrence of her symptoms.  We will plan to proceed to the OR today for lap chole with ICG dye and likely DC from PACU if she tolerates this well.  I have provided her with a work note.  We discussed post op expectations as well.  I have explained the procedure, risks, and aftercare of cholecystectomy.  Risks include but are not limited to bleeding, infection, wound problems,  anesthesia, diarrhea, bile leak, injury to common bile duct/liver/intestine.  She seems to understand and agrees to proceed.   FEN - NPO VTE - SCDs in OR ID - Rocephin  on call Admit - obs, likely home from PACU  DM HTN  I reviewed ED provider notes, last 24 h vitals and pain scores, last 48 h intake and output, last 24 h labs and trends, and last 24 h imaging results.  Burnard FORBES Banter, [facility] 07/30/2024, 2:03 PM Please see Amion for pager number during day hours 7:00am-4:30pm or 7:00am -11:30am on weekends

## 2024-07-30 NOTE — ED Provider Notes (Signed)
 [place] EMERGENCY DEPARTMENT AT [facility] Provider Note   CSN: [id number] Arrival date & time: 07/30/24  9184     Patient presents with: Abdominal Pain   Valerie Moss is a 48 y.o. female.   HPI C/o epigatric pain starting this morning  - radiating to both upper quadrants. Worse when touched. Seen at [facility] - sent here this morning for further eval. UC gave 4mg  ODT Zofran      Prior to Admission medications   Medication Sig Start Date End Date Taking? Authorizing Provider  amLODipine  (NORVASC ) 10 MG tablet Take 1 tablet (10 mg total) by mouth daily. Please schedule with Zelda for more refills! 03/08/24   Vicci Barnie NOVAK, MD  atorvastatin  (LIPITOR) 10 MG tablet Take 1 tablet (10 mg total) by mouth daily. 03/08/24   Vicci Barnie NOVAK, MD  blood glucose meter kit and supplies Dispense based on patient and insurance preference. Use up to four times daily as directed. (FOR ICD-10 E10.9, E11.9). 01/13/19   Stroud, Natalie M, FNP  metFORMIN  (GLUCOPHAGE ) 500 MG tablet Take 1 tablet (500 mg total) by mouth 2 (two) times daily with a meal. Please make PCP appt for more refills. 03/08/24   Vicci Barnie NOVAK, MD  ondansetron  (ZOFRAN -ODT) 4 MG disintegrating tablet Take 1 tablet (4 mg total) by mouth every 8 (eight) hours as needed for nausea or vomiting. 07/30/24 08/19/24    Semaglutide , 1 MG/DOSE, 4 MG/3ML SOPN Inject 1 mg as directed once a week. 04/08/24   Vicci Barnie NOVAK, MD  triamcinolone  cream (KENALOG ) 0.1 % Apply topically 2 times daily. 09/09/23   Vicci Barnie NOVAK, MD  valsartan  (DIOVAN ) 40 MG tablet Take 1 tablet (40 mg total) by mouth daily. 03/08/24   Vicci Barnie NOVAK, MD  Vitamin D , Ergocalciferol , (DRISDOL ) 1.25 MG (50000 UNIT) CAPS capsule Take 1 capsule (50,000 Units total) by mouth every 7 (seven) days. Please fill 90-day supply. 04/25/23   Fleming, Zelda W, NP    Allergies: Pineapple, Lactose intolerance (gi), and Hydrochlorothiazide     Review of  Systems  Updated Vital Signs BP (!) 166/96 (BP Location: Left Arm)   Pulse 78   Temp 97.6 F (36.4 C) (Oral)   Resp 16   SpO2 100%   Physical Exam Vitals and nursing note reviewed.  Constitutional:      General: She is not in acute distress.    Appearance: She is well-developed.  HENT:     Head: Normocephalic and atraumatic.  Eyes:     Conjunctiva/sclera: Conjunctivae normal.  Cardiovascular:     Rate and Rhythm: Normal rate and regular rhythm.  Pulmonary:     Effort: Pulmonary effort is normal. No respiratory distress.     Breath sounds: No stridor.  Abdominal:     General: There is no distension.     Tenderness: There is generalized abdominal tenderness and tenderness in the right upper quadrant, epigastric area and left upper quadrant.  Skin:    General: Skin is warm and dry.  Neurological:     Mental Status: She is alert and oriented to person, place, and time.     Cranial Nerves: No cranial nerve deficit.  Psychiatric:        Mood and Affect: Mood normal.     (all labs ordered are listed, but only abnormal results are displayed) Labs Reviewed  COMPREHENSIVE METABOLIC PANEL WITH GFR - Abnormal; Notable for the following components:      Result Value   Sodium 134 (*)  Glucose, Bld 161 (*)    Total Protein 8.5 (*)    All other components within normal limits  CBC WITH DIFFERENTIAL/PLATELET - Abnormal; Notable for the following components:   Platelets 406 (*)    All other components within normal limits  LIPASE, BLOOD  HCG, SERUM, QUALITATIVE    EKG: None  Radiology: No results found.   Procedures   Medications Ordered in the ED  morphine  (PF) 4 MG/ML injection 4 mg (has no administration in time range)  sodium chloride  0.9 % bolus 1,000 mL (1,000 mLs Intravenous New Bag/Given 07/30/24 0905)  famotidine  (PEPCID ) IVPB 20 mg premix (20 mg Intravenous New Bag/Given 07/30/24 9092)  sucralfate  (CARAFATE ) 1 GM/10ML suspension 1 g (1 g Oral Given 07/30/24  0914)  fentaNYL  (SUBLIMAZE ) injection 25 mcg (25 mcg Intravenous Given 07/30/24 0909)  ondansetron  (ZOFRAN ) injection 4 mg (4 mg Intravenous Given 07/30/24 9092)                                    Medical Decision Making Adult female on Ozempic  presents with upper abdominal pain, nausea, vomiting. No other obvious precipitant, but differential including hepatobiliary dysfunction, pancreatitis, gastritis considered. Pulse ox 100% room air normal cardiac 90 sinus normal  Amount and/or Complexity of Data Reviewed External Data Reviewed: notes.    Details: Urgent care note reviewed Labs: ordered. Decision-making details documented in ED Course. Radiology: ordered.  Risk Prescription drug management. Decision regarding hospitalization.   10:55 AM Patient with persistent pain in spite of initial meds.  Ultrasound pending.  Initial labs reassuring.  12:12 PM Patient improved substantially, though she continued to have some pain, nausea. Ultrasound results no available, patient with 2.3 cm stone in gallbladder neck.  Labs are initially reassuring, she is afebrile, given the location of the stone, size, I discussed case with her surgical colleagues given concern for impacted gallstone.  Patient admitted for further monitoring, management anticipated surgical removal.   Final diagnoses:  Impacted gallstone of gallbladder     Garrick Charleston, MD 07/30/24 1535

## 2024-07-31 ENCOUNTER — Emergency Department (HOSPITAL_COMMUNITY): Admitting: Certified Registered Nurse Anesthetist

## 2024-07-31 ENCOUNTER — Encounter (HOSPITAL_COMMUNITY): Payer: Self-pay

## 2024-07-31 ENCOUNTER — Encounter (HOSPITAL_COMMUNITY)
Admission: EM | Disposition: A | Discharge status: Home / Self Care | Payer: Self-pay | Source: Ambulatory Visit | Attending: Emergency Medicine

## 2024-07-31 DIAGNOSIS — Z7984 Long term (current) use of oral hypoglycemic drugs: Secondary | ICD-10-CM

## 2024-07-31 DIAGNOSIS — K8000 Calculus of gallbladder with acute cholecystitis without obstruction: Secondary | ICD-10-CM

## 2024-07-31 DIAGNOSIS — E119 Type 2 diabetes mellitus without complications: Secondary | ICD-10-CM | POA: Diagnosis not present

## 2024-07-31 DIAGNOSIS — I10 Essential (primary) hypertension: Secondary | ICD-10-CM

## 2024-07-31 HISTORY — PX: CHOLECYSTECTOMY: SHX55

## 2024-07-31 LAB — GLUCOSE, CAPILLARY
Glucose-Capillary: 165 mg/dL — ABNORMAL HIGH (ref 70–99)
Glucose-Capillary: 178 mg/dL — ABNORMAL HIGH (ref 70–99)
Glucose-Capillary: 159 mg/dL — ABNORMAL HIGH (ref 70–99)

## 2024-07-31 SURGERY — LAPAROSCOPIC CHOLECYSTECTOMY
Anesthesia: General | Site: Abdomen

## 2024-07-31 MED ORDER — ONDANSETRON HCL 4 MG/2ML IJ SOLN
INTRAMUSCULAR | Status: DC | PRN
  Administered 2024-07-31: 4 mg via INTRAVENOUS

## 2024-07-31 MED ORDER — DROPERIDOL 2.5 MG/ML IJ SOLN: 0.6250 mg | Freq: Once | INTRAMUSCULAR | Status: DC | PRN

## 2024-07-31 MED ORDER — ACETAMINOPHEN 10 MG/ML IV SOLN: 1000.0000 mg | Freq: Once | INTRAVENOUS | Status: DC | PRN

## 2024-07-31 MED ORDER — ROCURONIUM BROMIDE 10 MG/ML (PF) SYRINGE
PREFILLED_SYRINGE | INTRAVENOUS | Status: DC | PRN
Start: 2024-07-31 — End: 2024-07-31
  Administered 2024-07-31: 60 mg via INTRAVENOUS
  Administered 2024-07-31: 30 mg via INTRAVENOUS

## 2024-07-31 MED ORDER — ONDANSETRON HCL 4 MG/2ML IJ SOLN
INTRAMUSCULAR | Status: AC
  Filled 2024-07-31: qty 2

## 2024-07-31 MED ORDER — SUGAMMADEX SODIUM 200 MG/2ML IV SOLN
INTRAVENOUS | Status: DC | PRN
  Administered 2024-07-31: 80 mg via INTRAVENOUS
  Administered 2024-07-31: 400 mg via INTRAVENOUS

## 2024-07-31 MED ORDER — FENTANYL CITRATE (PF) 100 MCG/2ML IJ SOLN
INTRAMUSCULAR | Status: AC
  Filled 2024-07-31: qty 2

## 2024-07-31 MED ORDER — OXYCODONE HCL 5 MG/5ML PO SOLN: 5.0000 mg | Freq: Once | ORAL | Status: DC | PRN

## 2024-07-31 MED ORDER — HYDROMORPHONE HCL 1 MG/ML IJ SOLN
INTRAMUSCULAR | Status: DC | PRN
  Administered 2024-07-31 (×2): 1 mg via INTRAVENOUS

## 2024-07-31 MED ORDER — SODIUM CHLORIDE 0.9 % IV SOLN
INTRAVENOUS | Status: AC
  Filled 2024-07-31: qty 20

## 2024-07-31 MED ORDER — LACTATED RINGERS IV SOLN: INTRAVENOUS | Status: DC | PRN

## 2024-07-31 MED ORDER — KETOROLAC TROMETHAMINE 30 MG/ML IJ SOLN
INTRAMUSCULAR | Status: AC
Start: 2024-07-31 — End: 2024-07-31
  Filled 2024-07-31: qty 1

## 2024-07-31 MED ORDER — PROPOFOL 10 MG/ML IV BOLUS
INTRAVENOUS | Status: AC
  Filled 2024-07-31: qty 20

## 2024-07-31 MED ORDER — SPY AGENT GREEN - (INDOCYANINE FOR INJECTION)
INTRAMUSCULAR | Status: DC | PRN
  Administered 2024-07-31: 1.25 mg via INTRAVENOUS

## 2024-07-31 MED ORDER — LIDOCAINE 2% (20 MG/ML) 5 ML SYRINGE
INTRAMUSCULAR | Status: DC | PRN
  Administered 2024-07-31: 60 mg via INTRAVENOUS

## 2024-07-31 MED ORDER — OXYCODONE HCL 5 MG PO TABS
5.0000 mg | ORAL_TABLET | ORAL | Status: DC | PRN
  Filled 2024-07-31: qty 2

## 2024-07-31 MED ORDER — ROCURONIUM BROMIDE 10 MG/ML (PF) SYRINGE
PREFILLED_SYRINGE | INTRAVENOUS | Status: AC
Start: 2024-07-31 — End: 2024-07-31
  Filled 2024-07-31: qty 10

## 2024-07-31 MED ORDER — LIDOCAINE HCL (PF) 2 % IJ SOLN
INTRAMUSCULAR | Status: AC
  Filled 2024-07-31: qty 5

## 2024-07-31 MED ORDER — DEXMEDETOMIDINE HCL IN NACL 80 MCG/20ML IV SOLN
INTRAVENOUS | Status: DC | PRN
  Administered 2024-07-31: 12 ug via INTRAVENOUS
  Administered 2024-07-31: 4 ug via INTRAVENOUS

## 2024-07-31 MED ORDER — BUPIVACAINE-EPINEPHRINE 0.25% -1:200000 IJ SOLN
INTRAMUSCULAR | Status: DC | PRN
  Administered 2024-07-31: 20 mL

## 2024-07-31 MED ORDER — LACTATED RINGERS IR SOLN
Status: DC | PRN
  Administered 2024-07-31: 1000 mL

## 2024-07-31 MED ORDER — BUPIVACAINE-EPINEPHRINE (PF) 0.25% -1:200000 IJ SOLN
INTRAMUSCULAR | Status: AC
  Filled 2024-07-31: qty 30

## 2024-07-31 MED ORDER — MIDAZOLAM HCL 2 MG/2ML IJ SOLN
INTRAMUSCULAR | Status: AC
  Filled 2024-07-31: qty 2

## 2024-07-31 MED ORDER — MIDAZOLAM HCL (PF) 2 MG/2ML IJ SOLN
INTRAMUSCULAR | Status: DC | PRN
Start: 2024-07-31 — End: 2024-07-31
  Administered 2024-07-31: 2 mg via INTRAVENOUS

## 2024-07-31 MED ORDER — DEXAMETHASONE SOD PHOSPHATE PF 10 MG/ML IJ SOLN
INTRAMUSCULAR | Status: DC | PRN
  Administered 2024-07-31: 10 mg via INTRAVENOUS

## 2024-07-31 MED ORDER — KETOROLAC TROMETHAMINE 30 MG/ML IJ SOLN
INTRAMUSCULAR | Status: DC | PRN
  Administered 2024-07-31: 30 mg via INTRAVENOUS

## 2024-07-31 MED ORDER — SUGAMMADEX SODIUM 200 MG/2ML IV SOLN
INTRAVENOUS | Status: AC
  Filled 2024-07-31: qty 4

## 2024-07-31 MED ORDER — FENTANYL CITRATE (PF) 50 MCG/ML IJ SOSY: 25.0000 ug | PREFILLED_SYRINGE | INTRAMUSCULAR | Status: DC | PRN

## 2024-07-31 MED ORDER — PROPOFOL 10 MG/ML IV BOLUS
INTRAVENOUS | Status: DC | PRN
Start: 2024-07-31 — End: 2024-07-31
  Administered 2024-07-31: 200 mg via INTRAVENOUS

## 2024-07-31 MED ORDER — OXYCODONE HCL 5 MG PO TABS: 5.0000 mg | ORAL_TABLET | Freq: Once | ORAL | Status: DC | PRN

## 2024-07-31 MED ORDER — FENTANYL CITRATE (PF) 100 MCG/2ML IJ SOLN
INTRAMUSCULAR | Status: DC | PRN
  Administered 2024-07-31: 100 ug via INTRAVENOUS
  Administered 2024-07-31 (×2): 50 ug via INTRAVENOUS

## 2024-07-31 MED ORDER — HYDROMORPHONE HCL 2 MG/ML IJ SOLN
INTRAMUSCULAR | Status: AC
  Filled 2024-07-31: qty 1

## 2024-07-31 SURGICAL SUPPLY — 42 items
APPLICATOR ARISTA FLEXITIP XL (MISCELLANEOUS) IMPLANT
BAG COUNTER SPONGE SURGICOUNT (BAG) IMPLANT
CHLORAPREP W/TINT 26 (MISCELLANEOUS) ×1 IMPLANT
CLIP APPLIE 5 13 M/L LIGAMAX5 (MISCELLANEOUS) IMPLANT
CLIP APPLIE ROT 10 11.4 M/L (STAPLE) IMPLANT
CLIP LIGATING HEMO O LOK GREEN (MISCELLANEOUS) IMPLANT
CLSR STERI-STRIP ANTIMIC 1/2X4 (GAUZE/BANDAGES/DRESSINGS) IMPLANT
COVER MAYO STAND XLG (MISCELLANEOUS) IMPLANT
COVER SURGICAL LIGHT HANDLE (MISCELLANEOUS) ×1 IMPLANT
DERMABOND ADVANCED .7 DNX12 (GAUZE/BANDAGES/DRESSINGS) IMPLANT
DRAPE C-ARM 42X120 X-RAY (DRAPES) IMPLANT
DRSG TEGADERM 2-3/8X2-3/4 SM (GAUZE/BANDAGES/DRESSINGS) ×3 IMPLANT
DRSG TEGADERM 4X4.75 (GAUZE/BANDAGES/DRESSINGS) ×1 IMPLANT
ELECT REM PT RETURN 15FT ADLT (MISCELLANEOUS) ×1 IMPLANT
GAUZE SPONGE 2X2 8PLY STRL LF (GAUZE/BANDAGES/DRESSINGS) ×1 IMPLANT
GLOVE BIO SURGEON STRL SZ7.5 (GLOVE) ×1 IMPLANT
GLOVE INDICATOR 8.0 STRL GRN (GLOVE) ×1 IMPLANT
GOWN STRL REUS W/ TWL XL LVL3 (GOWN DISPOSABLE) ×1 IMPLANT
GRASPER SUT TROCAR 14GX15 (MISCELLANEOUS) IMPLANT
HEMOSTAT ARISTA ABSORB 3G PWDR (HEMOSTASIS) IMPLANT
HEMOSTAT SNOW SURGICEL 2X4 (HEMOSTASIS) IMPLANT
IRRIGATION SUCT STRKRFLW 2 WTP (MISCELLANEOUS) ×1 IMPLANT
KIT BASIN OR (CUSTOM PROCEDURE TRAY) ×1 IMPLANT
KIT IMAGING PINPOINTPAQ (MISCELLANEOUS) IMPLANT
KIT TURNOVER KIT A (KITS) ×1 IMPLANT
POUCH RETRIEVAL ECOSAC 10 (ENDOMECHANICALS) ×1 IMPLANT
SCISSORS LAP 5X35 DISP (ENDOMECHANICALS) ×1 IMPLANT
SET CHOLANGIOGRAPH MIX (MISCELLANEOUS) IMPLANT
SET TUBE SMOKE EVAC HIGH FLOW (TUBING) ×1 IMPLANT
SLEEVE ADV FIXATION 5X100MM (TROCAR) ×1 IMPLANT
SPIKE FLUID TRANSFER (MISCELLANEOUS) ×1 IMPLANT
STRIP CLOSURE SKIN 1/2X4 (GAUZE/BANDAGES/DRESSINGS) ×1 IMPLANT
SUT MNCRL AB 4-0 PS2 18 (SUTURE) ×1 IMPLANT
SUT VIC AB 0 UR5 27 (SUTURE) IMPLANT
SUT VICRYL 0 TIES 12 18 (SUTURE) IMPLANT
SUT VICRYL 0 UR6 27IN ABS (SUTURE) ×1 IMPLANT
TOWEL OR DSP ST BLU DLX 10/PK (DISPOSABLE) ×1 IMPLANT
TRAY LAPAROSCOPIC (CUSTOM PROCEDURE TRAY) ×1 IMPLANT
TROCAR ADV FIXATION 12X100MM (TROCAR) IMPLANT
TROCAR ADV FIXATION 5X100MM (TROCAR) ×1 IMPLANT
TROCAR BALLN 12MMX100 BLUNT (TROCAR) IMPLANT
TROCAR XCEL NON-BLD 5MMX100MML (ENDOMECHANICALS) IMPLANT

## 2024-07-31 NOTE — Op Note (Signed)
 Valerie Moss [id number] Dec 22, 1975 07/31/2024  Laparoscopic Cholecystectomy with near infrared fluorescent cholangiography procedure Note  Indications: This patient presents with symptomatic gallbladder disease and will undergo laparoscopic cholecystectomy.  Pre-operative Diagnosis: acute calculous cholecystitis  Post-operative Diagnosis: Same  Surgeon: Camellia Blush MD FACS  Assistants: none  Anesthesia: General endotracheal anesthesia  Procedure Details  The patient was seen again in the Holding Room. The risks, benefits, complications, treatment options, and expected outcomes were discussed with the patient. The possibilities of reaction to medication, pulmonary aspiration, perforation of viscus, bleeding, recurrent infection, finding a normal gallbladder, the need for additional procedures, failure to diagnose a condition, the possible need to convert to an open procedure, and creating a complication requiring transfusion or operation were discussed with the patient. The likelihood of improving the patient's symptoms with return to their baseline status is good.  The patient and/or family concurred with the proposed plan, giving informed consent. The site of surgery properly noted. The patient was taken to Operating Room, identified as Valerie Moss and the procedure verified as Laparoscopic Cholecystectomy with ICG dye.  A Time Out was held and the above information confirmed. Antibiotic prophylaxis was administered.    ICG dye was administered preoperatively.    General endotracheal anesthesia was then administered and tolerated well. After the induction, the abdomen was prepped with Chloraprep and draped in the sterile fashion. The patient was positioned in the supine position.  Access to the abdomen was obtained via the Optiview technique given her severe obesity.  A small incision was made to the left of the midline just below the subcostal margin.  Then using a 0 degree 5 mm  laparoscope through a 5 mm trocar the laparoscope was advanced through all layers of the abdominal wall and carefully entered the abdominal cavity.  Pneumoperitoneum was smoothly established up to a patient pressure of 15 mmHg without any change in patient vital signs.  The laparoscope was advanced in the abdominal cavity was surveilled.  There is no evidence of injury to surrounding structures. We positioned the patient in reverse Trendelenburg, tilted slightly to the patient's left.  A 5 mm port was placed in the umbilical position under direct visualization.  The optical entry trocar was exchanged for a 12 mm trocar.  Two 5-mm ports were placed in the right upper quadrant. All skin incisions were infiltrated with a local anesthetic agent before making the incision and placing the trocars.     The gallbladder was identified.  It was distended and edematous and I aspirated it in order to facilitate retraction., the fundus grasped and retracted cephalad. Adhesions were lysed bluntly and with the electrocautery where indicated, taking care not to injure any adjacent organs or viscus. The infundibulum was grasped and retracted laterally, exposing the peritoneum overlying the triangle of Calot. This was then divided and exposed in a blunt fashion. A critical view of the cystic duct and cystic artery was obtained.  The cystic duct was clearly identified and bluntly dissected circumferentially.  Utilizing the Stryker camera system near infrared fluorescent activity was visualized in the liver, cystic duct, common hepatic duct and common bile duct.  This served as a secondary confirmation of our anatomy.  The cystic duct was then ligated with clips and divided. The cystic artery which had been identified & dissected free was ligated with clips and divided as well.   The gallbladder was dissected from the liver bed in retrograde fashion with the electrocautery. The gallbladder was removed and placed  in an Ecco sac.  The liver bed was irrigated and inspected. Hemostasis was achieved with the electrocautery. Copious irrigation was utilized and was repeatedly aspirated until clear.    The gallbladder and Ecco sac were then removed through the left upper quadrant port site. We again inspected the right upper quadrant for hemostasis.  The left upper quadrant port site was closed with three interrupted 0 Vicryls using a PMI suture passer with laparoscopic guidance.  The left upper quadrant closure was inspected and there was no air leak and nothing trapped within the closure. Pneumoperitoneum was released as we removed the trocars.  4-0 Monocryl was used to close the skin.    steri-strips, and clean dressings were applied. The patient was then extubated and brought to the recovery room in stable condition. Instrument, sponge, and needle counts were correct at closure and at the conclusion of the case.   Findings: Cholecystitis with Cholelithiasis  Estimated Blood Loss: Minimal         Drains: none         Specimens: Gallbladder           Complications: None; patient tolerated the procedure well.         Disposition: PACU - hemodynamically stable.         Condition: stable  Camellia HERO. Tanda, MD, FACS General, Bariatric, & Minimally Invasive Surgery [facility],  A [facility]

## 2024-07-31 NOTE — Transfer of Care (Addendum)
 Immediate Anesthesia Transfer of Care Note  Patient: Valerie Moss  Procedure(s) Performed: LAPAROSCOPIC CHOLECYSTECTOMY (Abdomen)  Patient Location: PACU  Anesthesia Type:General  Level of Consciousness: drowsy and patient cooperative  Airway & Oxygen Therapy: Patient Spontanous Breathing and Patient connected to face mask oxygen  Post-op Assessment: Report given to RN and Post -op Vital signs reviewed and stable  Post vital signs: Reviewed and stable  Last Vitals:  Vitals Value Taken Time  BP 136/69 07/31/24   10:30  Temp 37.1 07/31/24   10:30  Pulse 83 07/31/24 10:32  Resp 15 07/31/24 10:32  SpO2 100 % 07/31/24 10:32  Vitals shown include unfiled device data.  Last Pain:  Vitals:   07/31/24 0715  TempSrc:   PainSc: 0-No pain         Complications: No notable events documented.

## 2024-07-31 NOTE — Interval H&P Note (Signed)
 History and Physical Interval Note:  07/31/2024 7:27 AM  Valerie Moss  has presented today for surgery, with the diagnosis of BILIARY COLIC.  The various methods of treatment have been discussed with the patient and family. After consideration of risks, benefits and other options for treatment, the patient has consented to  Procedure(s) with comments: LAPAROSCOPIC CHOLECYSTECTOMY (N/A) - LAPAROSCOPIC CHOLECYSTECTOMY WITH ICG DYE as a surgical intervention.  The patient's history has been reviewed, patient examined, no change in status, stable for surgery.  I have reviewed the patient's chart and labs.  Questions were answered to the patient's satisfaction.    Camellia HERO. Tanda, MD, FACS General, Bariatric, & Minimally Invasive Surgery [facility],  A [facility]  Camellia Tanda

## 2024-07-31 NOTE — Anesthesia Postprocedure Evaluation (Signed)
 Anesthesia Post Note  Patient: Valerie Moss  Procedure(s) Performed: LAPAROSCOPIC CHOLECYSTECTOMY (Abdomen)     Patient location during evaluation: PACU Anesthesia Type: General Level of consciousness: awake and alert Pain management: pain level controlled Vital Signs Assessment: post-procedure vital signs reviewed and stable Respiratory status: spontaneous breathing, nonlabored ventilation, respiratory function stable and patient connected to nasal cannula oxygen Cardiovascular status: blood pressure returned to baseline and stable Postop Assessment: no apparent nausea or vomiting Anesthetic complications: no   No notable events documented.  Last Vitals:  Vitals:   07/31/24 1242 07/31/24 1332  BP: 129/78 126/72  Pulse: 81 80  Resp: 18 18  Temp: (!) 36.4 C 36.4 C  SpO2: 94% 95%    Last Pain:  Vitals:   07/31/24 1332  TempSrc: Oral  PainSc:                  Epifanio Lamar BRAVO

## 2024-07-31 NOTE — Anesthesia Procedure Notes (Signed)
 Procedure Name: Intubation Date/Time: 07/31/2024 9:08 AM  Performed by: Para Jerelene CROME, CRNAPre-anesthesia Checklist: Patient identified, Emergency Drugs available, Suction available and Patient being monitored Patient Re-evaluated:Patient Re-evaluated prior to induction Oxygen Delivery Method: Circle system utilized Preoxygenation: Pre-oxygenation with 100% oxygen Induction Type: IV induction Ventilation: Mask ventilation without difficulty Laryngoscope Size: Mac and 4 Grade View: Grade II Tube type: Oral Tube size: 7.5 mm Number of attempts: 1 Airway Equipment and Method: Stylet Placement Confirmation: ETT inserted through vocal cords under direct vision, positive ETCO2, CO2 detector and breath sounds checked- equal and bilateral Secured at: 23 (OETT secured 23 cm at lower lip.) cm Tube secured with: Tape Dental Injury: Teeth and Oropharynx as per pre-operative assessment  Comments: Atraumatic intubation x 1. Lips and teeth remain in preoperative condition.

## 2024-07-31 NOTE — Addendum Note (Signed)
 Addendum  created 07/31/24 1415 by Para Jerelene CROME, CRNA   Clinical Note Signed, Flowsheet accepted

## 2024-07-31 NOTE — Plan of Care (Signed)
  Problem: Skin Integrity: Goal: Risk for impaired skin integrity will decrease Outcome: Progressing   Problem: Education: Goal: Knowledge of General Education information will improve Description: Including pain rating scale, medication(s)/side effects and non-pharmacologic comfort measures Outcome: Progressing   Problem: Activity: Goal: Risk for activity intolerance will decrease Outcome: Progressing   Problem: Elimination: Goal: Will not experience complications related to urinary retention Outcome: Progressing

## 2024-08-01 ENCOUNTER — Other Ambulatory Visit (HOSPITAL_COMMUNITY): Payer: Self-pay

## 2024-08-01 ENCOUNTER — Encounter (HOSPITAL_COMMUNITY): Payer: Self-pay | Admitting: General Surgery

## 2024-08-01 LAB — CBC
HCT: 37.2 % (ref 36.0–46.0)
Hemoglobin: 11.9 g/dL — ABNORMAL LOW (ref 12.0–15.0)
MCH: 27.6 pg (ref 26.0–34.0)
MCHC: 32 g/dL (ref 30.0–36.0)
MCV: 86.3 fL (ref 80.0–100.0)
Platelets: 361 K/uL (ref 150–400)
RBC: 4.31 MIL/uL (ref 3.87–5.11)
RDW: 12 % (ref 11.5–15.5)
WBC: 10.4 K/uL (ref 4.0–10.5)
nRBC: 0 % (ref 0.0–0.2)

## 2024-08-01 LAB — HIV ANTIBODY (ROUTINE TESTING W REFLEX): HIV Screen 4th Generation wRfx: NONREACTIVE

## 2024-08-01 LAB — GLUCOSE, CAPILLARY: Glucose-Capillary: 92 mg/dL (ref 70–99)

## 2024-08-01 MED ORDER — OXYCODONE HCL 5 MG PO TABS
5.0000 mg | ORAL_TABLET | Freq: Four times a day (QID) | ORAL | 0 refills | Status: AC | PRN
  Filled 2024-08-01: qty 15, 4d supply, fill #0

## 2024-08-01 MED ORDER — ACETAMINOPHEN 500 MG PO TABS: 1000.0000 mg | ORAL_TABLET | Freq: Three times a day (TID) | ORAL | Status: AC

## 2024-08-01 NOTE — Plan of Care (Signed)

## 2024-08-01 NOTE — Discharge Summary (Signed)
 Physician Discharge Summary  FEMALE IAFRATE FMW:[id number] DOB: 08-Dec-1975 DOA: 07/30/2024  PCP: Vicci Barnie NOVAK, MD  Admit date: 07/30/2024 Discharge date: 08/01/2024  Recommendations for Outpatient Follow-up:     Follow-up Information     Maczis, Tonja Barban, PA-C Follow up on 08/27/2024.   Specialty: General Surgery Why: 2:00pm, Arrive 30 minutes prior to your appointment time, Please bring your insurance card and photo ID Contact information: [address] [facility] [place] [address] [phone]                Discharge Diagnoses:  Acute calculous cholecystitis status post laparoscopic cholecystectomy with near infrared fluorescent cholangiography Diabetes mellitus Hypertension  Surgical Procedure: Laparoscopic cholecystectomy with near infrared fluorescent cholangiography November 22  Discharge Condition: Good Disposition: Home  Diet recommendation: Diabetic diet  Filed Weights   07/30/24 1429  Weight: 120.9 kg    History of present illness:  This is a pleasant 48 yo female with a history of DM and HTN who has been using Ozempic  for just over the last year. She woke up this morning around 0300am with epigastric abdominal pain that radiated across her upper abdomen. She developed some nausea and one episode of emesis. She has never had pain like this before. Her last dose of Ozempic  was last Saturday. She has never had pain like this with her injections in the past. She presented to the ED for evaluation and found to have normal LFTs, normal WBC, but an US  that reveals a 2.3cm gallstone in the gallbladder neck. No definitive evidence of cholecystitis. She continued to have some pain and we were asked to see her.   Hospital Course:  The patient was brought in and started on antibiotics for cholecystitis.  She was taken to the operating room on November 22 for laparoscopic cholecystectomy.  Unfortunately she was a little  bit too lethargic to go home same day.  She was kept again for observation.  On day of discharge she was tolerating a diet.  She was ambulating.  Her pain was well-controlled.  She her vital signs were stable.  We discussed discharge instructions  BP (!) 142/77 (BP Location: Right Arm)   Pulse 92   Temp 98.1 F (36.7 C) (Oral)   Resp 16   Ht 5' 7 (1.702 m)   Wt 120.9 kg   SpO2 94%   BMI 41.76 kg/m   Gen: alert, NAD, non-toxic appearing Pupils: equal, no scleral icterus Pulm:  symmetric chest rise CV: regular rate and rhythm Abd: soft, approp tender, nondistended. No cellulitis. No incisional hernia Ext: no edema, no calf tenderness Skin: no rash, no jaundice    Discharge Instructions  Discharge Instructions     Call MD for:   Complete by: As directed    Temperature >101   Call MD for:  hives   Complete by: As directed    Call MD for:  persistant dizziness or light-headedness   Complete by: As directed    Call MD for:  persistant nausea and vomiting   Complete by: As directed    Call MD for:  redness, tenderness, or signs of infection (pain, swelling, redness, odor or green/yellow discharge around incision site)   Complete by: As directed    Call MD for:  severe uncontrolled pain   Complete by: As directed    Diet Carb Modified   Complete by: As directed    Discharge instructions   Complete by: As directed  See [facility]CS discharge instructions   Increase activity slowly   Complete by: As directed       Allergies as of 08/01/2024       Reactions   Pineapple Other (See Comments)   Tongue burns   Lactose Intolerance (gi)    Hydrochlorothiazide  Other (See Comments)   Patient reports hair fell out        Medication List     TAKE these medications    acetaminophen  500 MG tablet Commonly known as: TYLENOL  Take 2 tablets (1,000 mg total) by mouth every 8 (eight) hours for 5 days.   amLODipine  10 MG tablet Commonly known as: NORVASC  Take 1 tablet (10 mg  total) by mouth daily. Please schedule with Zelda for more refills!   atorvastatin  10 MG tablet Commonly known as: LIPITOR Take 1 tablet (10 mg total) by mouth daily.   blood glucose meter kit and supplies Dispense based on patient and insurance preference. Use up to four times daily as directed. (FOR ICD-10 E10.9, E11.9).   metFORMIN  500 MG tablet Commonly known as: GLUCOPHAGE  Take 1 tablet (500 mg total) by mouth 2 (two) times daily with a meal. Please make PCP appt for more refills.   ondansetron  4 MG disintegrating tablet Commonly known as: ZOFRAN -ODT Take 1 tablet (4 mg total) by mouth every 8 (eight) hours as needed for nausea or vomiting.   oxyCODONE  5 MG immediate release tablet Commonly known as: Oxy IR/ROXICODONE  Take 1 tablet (5 mg total) by mouth every 6 (six) hours as needed for severe pain (pain score 7-10).   Ozempic  (1 MG/DOSE) 4 MG/3ML Sopn Generic drug: Semaglutide  (1 MG/DOSE) Inject 1 mg as directed once a week.   triamcinolone  cream 0.1 % Commonly known as: KENALOG  Apply topically 2 times daily.   valsartan  40 MG tablet Commonly known as: DIOVAN  Take 1 tablet (40 mg total) by mouth daily.   Vitamin D  (Ergocalciferol ) 1.25 MG (50000 UNIT) Caps capsule Commonly known as: DRISDOL  Take 1 capsule (50,000 Units total) by mouth every 7 (seven) days. Please fill 90-day supply.        Follow-up Information     Maczis, Puja Gosai, PA-C Follow up on 08/27/2024.   Specialty: General Surgery Why: 2:00pm, Arrive 30 minutes prior to your appointment time, Please bring your insurance card and photo ID Contact information: [address] [facility] [place] [address] [phone]                  The results of significant diagnostics from this hospitalization (including imaging, microbiology, ancillary and laboratory) are listed below for reference.    Significant Diagnostic Studies: US  Abdomen Limited RUQ  (LIVER/GB) Result Date: 07/30/2024 CLINICAL DATA:  [id number] Abdominal pain [id number] EXAM: ULTRASOUND ABDOMEN LIMITED RIGHT UPPER QUADRANT COMPARISON:  None Available. FINDINGS: Gallbladder: There is a 2.3 cm cholelithiasis within the gallbladder neck. No wall thickening or pericholecystic fluid. No sonographic Murphy sign noted by sonographer. Common bile duct: Diameter: Visualized portion measures 5 mm, within normal limits. Liver: No focal lesion identified. Within normal limits in parenchymal echogenicity. Portal vein is patent on color Doppler imaging with normal direction of blood flow towards the liver. Other: None. IMPRESSION: There is a 2.3 cm cholelithiasis within the gallbladder neck. No sonographic evidence of acute cholecystitis. Electronically Signed   By: Corean Salter M.D.   On: 07/30/2024 11:29    Microbiology: No results found for this or any previous visit (from the past 240 hours).  Labs: Basic Metabolic Panel: Recent Labs  Lab 07/30/24 0854  NA 134*  K 3.7  CL 99  CO2 26  GLUCOSE 161*  BUN 8  CREATININE 0.66  CALCIUM  9.4   Liver Function Tests: Recent Labs  Lab 07/30/24 0854  AST 24  ALT 23  ALKPHOS 99  BILITOT 0.9  PROT 8.5*  ALBUMIN 4.6   Recent Labs  Lab 07/30/24 0854  LIPASE 16   No results for input(s): AMMONIA in the last 168 hours. CBC: Recent Labs  Lab 07/30/24 0854 08/01/24 0531  WBC 7.1 10.4  NEUTROABS 5.9  --   HGB 12.8 11.9*  HCT 39.8 37.2  MCV 86.3 86.3  PLT 406* 361   Cardiac Enzymes: No results for input(s): CKTOTAL, CKMB, CKMBINDEX, TROPONINI in the last 168 hours. BNP: BNP (last 3 results) No results for input(s): BNP in the last 8760 hours.  ProBNP (last 3 results) No results for input(s): PROBNP in the last 8760 hours.  CBG: Recent Labs  Lab 07/30/24 1414 07/31/24 1256 07/31/24 1719 07/31/24 2058 08/01/24 0730  GLUCAP 87 165* 178* 159* 92    Principal Problem:   Biliary colic   Time  coordinating discharge: 15 min  Signed:  Camellia CHRISTELLA Blush, MD [facility] [phone] 08/01/2024, 10:42 AM

## 2024-08-01 NOTE — Progress Notes (Signed)
 Nurse reviewed discharge instructions with pt. Pt verbalized understanding of discharge instructions, follow up appointments and new medications.  Work note printed and given to pt. No concerns at time of discharge

## 2024-08-01 NOTE — TOC Initial Note (Signed)
 Transition of Care [facility]) - Initial/Assessment Note    Patient Details  Name: Valerie Moss MRN: [id number] Date of Birth: [birth year]  [facility] CM/SW Contact:    Sonda Manuella Quill, RN Phone Number: 08/01/2024, 10:32 AM  Clinical Narrative:                 Spoke w/ pt and mother in room; pt said she lives at home w/ her dtr; she plans to return at d/c w/ family support; pt identified POC dtr Russell Schimke [phone]); she will provide transportation; pt verified insurance/PCP; she denied SDOH risks; she does not have DME, HH services, or home oxygen; no IP CM needs.  Expected Discharge Plan: Home/Self Care Barriers to Discharge: No Barriers Identified   Patient Goals and CMS Choice Patient states their goals for this hospitalization and ongoing recovery are:: home          Expected Discharge Plan and Services   Discharge Planning Services: CM Consult   Living arrangements for the past 2 months: Apartment Expected Discharge Date: 08/01/24               DME Arranged: N/A DME Agency: NA       HH Arranged: NA HH Agency: NA        Prior Living Arrangements/Services Living arrangements for the past 2 months: Apartment Lives with:: Adult Children Patient language and need for interpreter reviewed:: Yes Do you feel safe going back to the place where you live?: Yes      Need for Family Participation in Patient Care: Yes (Comment) Care giver support system in place?: Yes (comment) Current home services:  (n/a) Criminal Activity/Legal Involvement Pertinent to Current Situation/Hospitalization: No - Comment as needed  Activities of Daily Living   ADL Screening (condition at time of admission) Independently performs ADLs?: Yes (appropriate for developmental age)  Permission Sought/Granted Permission sought to share information with : Case Manager Permission granted to share information with : Yes, Verbal Permission Granted  Share  Information with NAME: Case Manager     Permission granted to share info w Relationship: Russell Schimke (dtr) [phone]     Emotional Assessment Appearance:: Appears stated age Attitude/Demeanor/Rapport: Gracious Affect (typically observed): Accepting Orientation: : Oriented to Self, Oriented to Place, Oriented to  Time, Oriented to Situation Alcohol / Substance Use: Not Applicable Psych Involvement: No (comment)  Admission diagnosis:  Impacted gallstone of gallbladder [K80.20] Biliary colic [K80.50] Patient Active Problem List   Diagnosis Date Noted   Biliary colic 07/30/2024   Heart murmur 03/08/2024   Hypertension associated with diabetes (HCC) 10/29/2021   Hyperlipidemia associated with type 2 diabetes mellitus (HCC) 10/29/2021   Type 2 diabetes mellitus with morbid obesity (HCC) 10/29/2021   Eczema 10/29/2021   Weight loss 09/23/2019   Hemoglobin A1C between 7% and 9% indicating borderline diabetic control (HCC) 09/23/2019   History of 2019 novel coronavirus disease (COVID-19) 09/23/2019   Pneumonia due to COVID-19 virus 08/18/2019   Hypoxia 08/18/2019   Type 2 diabetes mellitus without complication 08/18/2019   COVID-19 08/17/2019   Cough 01/13/2019   Class 3 severe obesity due to excess calories with serious comorbidity and body mass index (BMI) of 45.0 to 49.9 in adult [facility]) 01/13/2019   Elevated glucose 01/13/2019   Elevated liver enzymes 01/13/2019   Anemia 01/13/2019   Hypocalcemia 01/13/2019   Pneumonia 12/26/2018   Tachypnea 12/26/2018   Leucocytosis 12/26/2018   Hypokalemia 12/26/2018   Hyponatremia 12/26/2018   PCP:  Vicci,  Barnie NOVAK, MD Pharmacy:   [facility] [address], [place] - [address] [place] [address] Phone: [phone] Fax: [phone]  [facility] - [facility] [address] [place] [address] Phone: [phone] Fax: [phone]     Social Drivers of  Health (SDOH) Social History: SDOH Screenings   Food Insecurity: No Food Insecurity (08/01/2024)  Housing: Low Risk  (08/01/2024)  Transportation Needs: No Transportation Needs (08/01/2024)  Utilities: Not At Risk (08/01/2024)  Depression (PHQ2-9): Low Risk  (03/08/2024)  Financial Resource Strain: Low Risk  (03/08/2024)  Physical Activity: Sufficiently Active (04/21/2023)  Social Connections: Unknown (03/08/2024)  Stress: No Stress Concern Present (04/21/2023)  Tobacco Use: Low Risk  (07/31/2024)   SDOH Interventions: Food Insecurity Interventions: Intervention Not Indicated, Inpatient TOC Housing Interventions: Intervention Not Indicated, Inpatient TOC Transportation Interventions: Intervention Not Indicated, Inpatient TOC Utilities Interventions: Intervention Not Indicated, Inpatient TOC   Readmission Risk Interventions     No data to display

## 2024-08-02 ENCOUNTER — Telehealth: Payer: Self-pay

## 2024-08-02 NOTE — Transitions of Care (Post Inpatient/ED Visit) (Signed)
 08/02/2024  Name: DARIYA GAINER MRN: [id number] DOB: Aug 31, 1976  Today's TOC FU Call Status: Today's TOC FU Call Status:: Successful TOC FU Call Completed TOC FU Call Complete Date: 08/02/24  Patient's Name and Date of Birth confirmed. Name, DOB  Transition Care Management Follow-up Telephone Call Date of Discharge: 08/01/24 Discharge Facility: [facility]) Type of Discharge: Inpatient Admission Primary Inpatient Discharge Diagnosis:: acute calculous cholecystitis How have you been since you were released from the hospital?: Better (She said she experienced some nausea today when she got up early. She said she took some zofran  which helped alleviate the nausea) Any questions or concerns?: No  Items Reviewed: Did you receive and understand the discharge instructions provided?: Yes Medications obtained,verified, and reconciled?: Yes (Medications Reviewed) (She stated she has all of her medications as well as a glucmoeter and she did not have any questions about the med regime.) Any new allergies since your discharge?: No Dietary orders reviewed?: Yes Type of Diet Ordered:: She said she was told to avoid fatty and fried foods. She stated she had some chicken soup. She also said that she knows she needs to drink more water. Do you have support at home?: Yes People in Home [RPT]: child(ren), adult Name of Support/Comfort Primary Source: her daughter  Medications Reviewed Today: Medications Reviewed Today     Reviewed by Marvis Bradley, RN (Case Manager) on 08/02/24 at 1618  Med List Status: <None>   Medication Order Taking? Sig Documenting Provider Last Dose Status Informant  acetaminophen  (TYLENOL ) 500 MG tablet [id number]  Take 2 tablets (1,000 mg total) by mouth every 8 (eight) hours for 5 days. Tanda Locus, MD  Active   amLODipine  (NORVASC ) 10 MG tablet [id number] No Take 1 tablet (10 mg total) by mouth daily. Please schedule with Zelda for more refills! Vicci Barnie NOVAK, MD 07/31/2024 Active Self  atorvastatin  (LIPITOR) 10 MG tablet [id number] No Take 1 tablet (10 mg total) by mouth daily.  Patient not taking: Reported on 07/31/2024   Vicci Barnie NOVAK, MD Not Taking Active Self  blood glucose meter kit and supplies [id number]  Dispense based on patient and insurance preference. Use up to four times daily as directed. (FOR ICD-10 E10.9, E11.9). Stroud, Natalie M, FNP  Active Self  metFORMIN  (GLUCOPHAGE ) 500 MG tablet [id number] No Take 1 tablet (500 mg total) by mouth 2 (two) times daily with a meal. Please make PCP appt for more refills. Vicci Barnie NOVAK, MD 07/30/2024 Active Self  ondansetron  (ZOFRAN -ODT) 4 MG disintegrating tablet [id number] No Take 1 tablet (4 mg total) by mouth every 8 (eight) hours as needed for nausea or vomiting.  07/30/2024 Active Self           Med Note RONELL, MICHAEL K   Fri Jul 30, 2024  2:21 PM) Adminiter 4mg  ODT by Urgent Care  oxyCODONE  (OXY IR/ROXICODONE ) 5 MG immediate release tablet [id number]  Take 1 tablet (5 mg total) by mouth every 6 (six) hours as needed for severe pain (pain score 7-10). Tanda Locus, MD  Active   Semaglutide , 1 MG/DOSE, 4 MG/3ML SOPN [id number] No Inject 1 mg as directed once a week. Vicci Barnie NOVAK, MD Past Week Active Self  triamcinolone  cream (KENALOG ) 0.1 % [id number] No Apply topically 2 times daily.  Patient not taking: Reported on 07/31/2024   Vicci Barnie NOVAK, MD Not Taking Active Self  valsartan  (DIOVAN ) 40 MG tablet [id number] No Take 1 tablet (40 mg total) by mouth daily. Vicci Barnie NOVAK, MD  07/31/2024 Active Self  Vitamin D , Ergocalciferol , (DRISDOL ) 1.25 MG (50000 UNIT) CAPS capsule [id number] No Take 1 capsule (50,000 Units total) by mouth every 7 (seven) days. Please fill 90-day supply.  Patient not taking: Reported on 07/31/2024   Theotis Haze ORN, NP Not Taking Active Self            Home Care and Equipment/Supplies: Were Home Health Services Ordered?: No Any new equipment  or medical supplies ordered?: No  Functional Questionnaire: Do you need assistance with bathing/showering or dressing?: No Do you need assistance with meal preparation?: No (She said she has used [facility].) Do you need assistance with eating?: No Do you have difficulty maintaining continence: No Do you need assistance with getting out of bed/getting out of a chair/moving?: No Do you have difficulty managing or taking your medications?: No  Follow up appointments reviewed: PCP Follow-up appointment confirmed?: Yes Date of PCP follow-up appointment?: 08/20/24 Follow-up Provider: [facility] Follow-up appointment confirmed?: Yes Date of Specialist follow-up appointment?: 08/27/24 Follow-Up Specialty Provider:: surgeon Do you need transportation to your follow-up appointment?: No Do you understand care options if your condition(s) worsen?: Yes-patient verbalized understanding    SIGNATURE.  Slater Diesel, RN

## 2024-08-06 LAB — SURGICAL PATHOLOGY

## 2024-08-09 ENCOUNTER — Ambulatory Visit: Payer: Self-pay | Admitting: Internal Medicine

## 2024-08-20 ENCOUNTER — Ambulatory Visit: Attending: Internal Medicine | Admitting: Internal Medicine

## 2024-08-20 ENCOUNTER — Other Ambulatory Visit (HOSPITAL_COMMUNITY): Payer: Self-pay

## 2024-08-20 DIAGNOSIS — Z7985 Long-term (current) use of injectable non-insulin antidiabetic drugs: Secondary | ICD-10-CM | POA: Diagnosis not present

## 2024-08-20 DIAGNOSIS — E1159 Type 2 diabetes mellitus with other circulatory complications: Secondary | ICD-10-CM

## 2024-08-20 DIAGNOSIS — E1169 Type 2 diabetes mellitus with other specified complication: Secondary | ICD-10-CM

## 2024-08-20 DIAGNOSIS — I10 Essential (primary) hypertension: Secondary | ICD-10-CM | POA: Diagnosis not present

## 2024-08-20 DIAGNOSIS — M5431 Sciatica, right side: Secondary | ICD-10-CM | POA: Diagnosis not present

## 2024-08-20 DIAGNOSIS — E11649 Type 2 diabetes mellitus with hypoglycemia without coma: Secondary | ICD-10-CM | POA: Diagnosis not present

## 2024-08-20 DIAGNOSIS — E162 Hypoglycemia, unspecified: Secondary | ICD-10-CM

## 2024-08-20 DIAGNOSIS — E785 Hyperlipidemia, unspecified: Secondary | ICD-10-CM | POA: Diagnosis not present

## 2024-08-20 LAB — GLUCOSE, POCT (MANUAL RESULT ENTRY): POC Glucose: 69 mg/dL — AB (ref 70–99)

## 2024-08-20 LAB — POCT GLYCOSYLATED HEMOGLOBIN (HGB A1C): HbA1c, POC (controlled diabetic range): 5.4 % (ref 0.0–7.0)

## 2024-08-20 MED ORDER — AMLODIPINE BESYLATE 10 MG PO TABS
10.0000 mg | ORAL_TABLET | Freq: Every day | ORAL | 1 refills | Status: AC
  Filled 2024-08-20 – 2024-09-14 (×4): qty 90, 90d supply, fill #0

## 2024-08-20 MED ORDER — SEMAGLUTIDE (1 MG/DOSE) 4 MG/3ML ~~LOC~~ SOPN
1.0000 mg | PEN_INJECTOR | SUBCUTANEOUS | 0 refills | Status: AC
  Filled 2024-08-20: qty 3, 28d supply, fill #0

## 2024-08-20 MED ORDER — GLUCOSE 4 G PO CHEW: 4.0000 | CHEWABLE_TABLET | Freq: Once | ORAL | Status: AC

## 2024-08-20 NOTE — Patient Instructions (Signed)
°  VISIT SUMMARY: Today, we reviewed your diabetes, hypertension, cholesterol, and sciatica. Your diabetes management is going well with a significant improvement in your A1c levels. We also discussed your blood pressure, cholesterol management, and sciatica pain. Additionally, we talked about the importance of scheduling your diabetic eye exam and colonoscopy.  YOUR PLAN: -TYPE 2 DIABETES MELLITUS WITH HYPOGLYCEMIA AND MORBID OBESITY: Your A1c level has improved to 5.4%, which is excellent. This means your blood sugar levels are well-controlled. Because of this improvement, we are discontinuing metformin . Continue taking Ozempic  1 mg weekly and monitor your blood sugar intermittently over the next month. If your blood sugars remain stable, we may consider increasing Ozempic  to 2 mg next year.  -HYPERTENSION ASSOCIATED WITH TYPE 2 DIABETES MELLITUS: Your blood pressure is within the target range, but home readings are slightly elevated. Hypertension means high blood pressure, which can lead to other health issues if not managed. Continue taking valsartan  40 mg and amlodipine  10 mg daily. Try to limit your salt intake and use low-sodium bouillon.  -HYPERLIPIDEMIA ASSOCIATED WITH TYPE 2 DIABETES MELLITUS: Your LDL cholesterol is 140 mg/dL, which is higher than the target level. Hyperlipidemia means you have high levels of fats in your blood, which can increase the risk of heart disease. Start taking atorvastatin  as prescribed to help lower your cholesterol levels. Statins are important in preventing cardiovascular events.  -CHRONIC RIGHT-SIDED SCIATICA: Sciatica is pain that radiates along the path of the sciatic nerve, which runs from your lower back through your hips and buttocks and down each leg. Your sciatica pain has worsened, likely due to prolonged standing and walking. Consider using anti-inflammatory medications like Naprosyn or Advil with food for pain management. Continue using a back brace and  soaking in a hot tub for relief.  -GENERAL HEALTH MAINTENANCE: We discussed the need for an annual urine check for proteinuria, which is a sign of kidney health. A referral for a diabetic eye exam has been made, and it is important to schedule this. Additionally, arrange transportation for your colonoscopy and complete the procedure, especially given your family history of colon cancer.  INSTRUCTIONS: Monitor your blood sugar intermittently over the next month. Follow up on the referral for your diabetic eye exam. Arrange transportation and complete your colonoscopy. Continue taking your medications as prescribed and limit your salt intake.                      Contains text generated by Abridge.                                 Contains text generated by Abridge.

## 2024-08-20 NOTE — Progress Notes (Unsigned)
 Patient ID: Valerie Moss, female    DOB: 1975/11/05  MRN: [id number]  CC: Diabetes (DM f/u./No questions / concerns/Already received flu vax. Yes to colonoscopy.)   Subjective: Valerie Moss is a 48 y.o. female who presents for chronic ds management. Her concerns today include:  Pt with hx of DM type 2, HTN, obesity, insomnia, COVID-19 infection 2020 hosp x 1 wk, eczema   Discussed the use of AI scribe software for clinical note transcription with the patient, who gave verbal consent to proceed.  History of Present Illness Valerie Moss is a 48 year old female with diabetes and hypertension who presents for follow-up of her chronic medical conditions.  DM: Results for orders placed or performed in visit on 08/20/24  POCT glucose (manual entry)   Collection Time: 08/20/24  3:34 PM  Result Value Ref Range   POC Glucose 69 (A) 70 - 99 mg/dl  POCT glycosylated hemoglobin (Hb A1C)   Collection Time: 08/20/24  3:35 PM  Result Value Ref Range   Hemoglobin A1C     HbA1c POC (<> result, manual entry)     HbA1c, POC (prediabetic range)     HbA1c, POC (controlled diabetic range) 5.4 0.0 - 7.0 %  Her last A1c in June was 7.4, and today it is 5.4. BS today on office visit was 69 but pt reports no symptoms. She is currently on Ozempic  1 mg and Metformin  500 mg in the morning (written as 500 mg twice a day). She has lost 16 pounds since June, now weighing 265 pounds, and attributes some of this to a decreased appetite from Ozempic . She has been cautious with her diet, especially after having her gallbladder removed on November 22nd, consuming mainly soup, broth, applesauce, and yogurt during the Thanksgiving holidays.  She experiences intermittent sciatica on the right side for yrs but has had a flair since returning to work after a week off post cholecystectomy. The pain radiates from the back of her RT knee up to her RT lower back, but there is no numbness or tingling. She uses a  back brace and performs stretching exercises, which usually help, but the pain has been severe recently. 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 She does not take any medication for the sciatica but finds relief with soaking in a hot tub with Epsom salt.  HTN: Her hypertension is managed with valsartan  40 mg and Norvasc  10 mg daily. She reports home blood pressure readings around 130/84 to 134/86. She tries to limit her salt intake, cooking mostly at home with chicken, ground turkey, and frozen vegetables, but occasionally uses regular chicken bouillon for seasoning.  HL: She has not started taking atorvastatin  for her cholesterol due to concerns about statins. Her LDL cholesterol was 140. She is considering alternatives like red yeast rice, although she has not started any supplements.   HM: She has not yet scheduled her diabetic eye exam due to previous long wait times and has not arranged for a colonoscopy despite a family history of colon cancer in her brother at age 71, due to difficulties in arranging for someone to go with her and be willing to transport her home postprocedure.    Patient Active Problem List   Diagnosis Date Noted   Biliary colic 07/30/2024   Heart murmur 03/08/2024   Hypertension  associated with diabetes (HCC) 10/29/2021   Hyperlipidemia associated with type 2 diabetes mellitus (HCC) 10/29/2021   Type 2 diabetes mellitus with morbid obesity (HCC) 10/29/2021   Eczema 10/29/2021   Weight loss 09/23/2019   Hemoglobin A1C between 7% and 9% indicating borderline diabetic control (HCC) 09/23/2019   History of 2019 novel coronavirus disease (COVID-19) 09/23/2019   Pneumonia due to COVID-19 virus 08/18/2019   Hypoxia 08/18/2019   Type 2 diabetes mellitus without complication 08/18/2019   COVID-19 08/17/2019   Cough 01/13/2019   Class 3  severe obesity due to excess calories with serious comorbidity and body mass index (BMI) of 45.0 to 49.9 in adult [facility]) 01/13/2019   Elevated glucose 01/13/2019   Elevated liver enzymes 01/13/2019   Anemia 01/13/2019   Hypocalcemia 01/13/2019   Pneumonia 12/26/2018   Tachypnea 12/26/2018   Leucocytosis 12/26/2018   Hypokalemia 12/26/2018   Hyponatremia 12/26/2018     Medications Ordered Prior to Encounter[1]  Allergies[2]  Social History   Socioeconomic History   Marital status: Single    Spouse name: Not on file   Number of children: Not on file   Years of education: Not on file   Highest education level: Associate degree: occupational, scientist, product/process development, or vocational program  Occupational History   Not on file  Tobacco Use   Smoking status: Never   Smokeless tobacco: Never  Vaping Use   Vaping status: Never Used  Substance and Sexual Activity   Alcohol use: Never   Drug use: Never   Sexual activity: Not Currently    Partners: Male  Other Topics Concern   Not on file  Social History Narrative   Not on file   Social Drivers of Health   Tobacco Use: Low Risk (07/31/2024)   Patient History    Smoking Tobacco Use: Never    Smokeless Tobacco Use: Never    Passive Exposure: Not on file  Financial Resource Strain: Low Risk (08/20/2024)   Overall Financial Resource Strain (CARDIA)    Difficulty of Paying Living Expenses: Not very hard  Food Insecurity: Food Insecurity Present (08/20/2024)   Epic    Worried About Programme Researcher, Broadcasting/film/video in the Last Year: Sometimes true    [employer] of Food in the Last Year: Sometimes true  Transportation Needs: No Transportation Needs (08/20/2024)   Epic    Lack of Transportation (Medical): No    Lack of Transportation (Non-Medical): No  Physical Activity: Sufficiently Active (08/20/2024)   Exercise Vital Sign    Days of Exercise per Week: 7 days    Minutes of Exercise per Session: 60 min  Stress: No Stress Concern Present (08/20/2024)    [employer] of Occupational Health - Occupational Stress Questionnaire    Feeling of Stress: Not at all  Social Connections: Moderately Isolated (08/20/2024)   Social Connection and Isolation Panel    Frequency of Communication with Friends and Family: Three times a week    Frequency of Social Gatherings with Friends and Family: Once a week    Attends Religious Services: 1 to 4 times per year    Active Member of [employer] or Organizations: No    Attends Banker Meetings: Not on file    Marital Status: Never married  Intimate Partner Violence: Not At Risk (08/01/2024)   Epic    Fear of Current or Ex-Partner: No    Emotionally Abused: No    Physically Abused: No    Sexually Abused: No  Depression (PHQ2-9): Low Risk (03/08/2024)  Depression (PHQ2-9)    PHQ-2 Score: 0  Alcohol Screen: Not on file  Housing: Low Risk (08/20/2024)   Epic    Unable to Pay for Housing in the Last Year: No    Number of Times Moved in the Last Year: 0    Homeless in the Last Year: No  Utilities: Low Risk (08/03/2024)   Received from [facility]   Utilities    In the past 12 months has the electric, gas, oil, or water company threatened to shut off services in your home? : No  Health Literacy: Not on file    Family History  Problem Relation Age of Onset   Hypertension Mother    Breast cancer Other 72 - 89   Breast cancer Other 70 - 79   Diabetes Other     Past Surgical History:  Procedure Laterality Date   CHOLECYSTECTOMY N/A 07/31/2024   Procedure: LAPAROSCOPIC CHOLECYSTECTOMY;  Surgeon: Tanda Locus, MD;  Location: [facility];  Service: General;  Laterality: N/A;  LAPAROSCOPIC CHOLECYSTECTOMY WITH ICG DYE    ROS: Review of Systems Negative except as stated above  PHYSICAL EXAM: BP 130/83   Pulse 85   Temp 97.9 F (36.6 C) (Oral)   Ht 5' 7 (1.702 m)   Wt 265 lb (120.2 kg)   SpO2 100%   BMI 41.50 kg/m   Wt Readings from Last 3 Encounters:  08/20/24 265 lb (120.2 kg)   07/30/24 266 lb 9.6 oz (120.9 kg)  03/08/24 281 lb (127.5 kg)    Physical Exam  General appearance - alert, well appearing, and in no distress Mental status - normal mood, behavior, speech, dress, motor activity, and thought processes Neck - supple, no significant adenopathy Chest - clear to auscultation, no wheezes, rales or rhonchi, symmetric air entry Heart - normal rate, regular rhythm, normal S1, S2, no murmurs, rubs, clicks or gallops Neurological - power 5/5 BL LE prox/distal. Gross sensation intact both legs. Straight leg raise negative Musculoskeletal - no tenderness on palpation of lumbar spine Extremities - no Le edema  Repeat BS 15 mins after being given for glucose tabs - 89     Latest Ref Rng & Units 07/30/2024    8:54 AM 03/08/2024    9:31 AM 04/21/2023    9:50 AM  CMP  Glucose 70 - 99 mg/dL 838  825  878   BUN 6 - 20 mg/dL 8  11  10    Creatinine 0.44 - 1.00 mg/dL 9.33  9.23  9.27   Sodium 135 - 145 mmol/L 134  136  140   Potassium 3.5 - 5.1 mmol/L 3.7  4.5  4.0   Chloride 98 - 111 mmol/L 99  99  102   CO2 22 - 32 mmol/L 26  20  22    Calcium  8.9 - 10.3 mg/dL 9.4  9.9  9.3   Total Protein 6.5 - 8.1 g/dL 8.5  7.4  7.3   Total Bilirubin 0.0 - 1.2 mg/dL 0.9  0.6  0.7   Alkaline Phos 38 - 126 U/L 99  77  60   AST 15 - 41 U/L 24  11  20    ALT 0 - 44 U/L 23  22  30     Lipid Panel     Component Value Date/Time   CHOL 207 (H) 03/08/2024 0931   TRIG 108 03/08/2024 0931   HDL 48 03/08/2024 0931   CHOLHDL 4.3 03/08/2024 0931   LDLCALC 140 (H) 03/08/2024 0931  CBC    Component Value Date/Time   WBC 10.4 08/01/2024 0531   RBC 4.31 08/01/2024 0531   HGB 11.9 (L) 08/01/2024 0531   HGB 13.6 03/08/2024 0931   HCT 37.2 08/01/2024 0531   HCT 43.1 03/08/2024 0931   PLT 361 08/01/2024 0531   PLT 390 03/08/2024 0931   MCV 86.3 08/01/2024 0531   MCV 87 03/08/2024 0931   MCH 27.6 08/01/2024 0531   MCHC 32.0 08/01/2024 0531   RDW 12.0 08/01/2024 0531   RDW 11.6  (L) 03/08/2024 0931   LYMPHSABS 0.8 07/30/2024 0854   LYMPHSABS 1.6 04/21/2023 0950   MONOABS 0.2 07/30/2024 0854   EOSABS 0.0 07/30/2024 0854   EOSABS 0.1 04/21/2023 0950   BASOSABS 0.0 07/30/2024 0854   BASOSABS 0.0 04/21/2023 0950    ASSESSMENT AND PLAN: 1. Type 2 diabetes mellitus with morbid obesity (HCC) (Primary) At goal but pt with asymptomatic hypoglycemia here in office today. Given 4 glucose tabs and BS 15 mins later was 89. Recommend d/c Metformin   - Continue Ozempic  1 mg weekly for another 4 wks before considering increase to 2 mg -Recommend CGM but pt declines. Agreeable to checking BS 1-2x/day manually to monitor for any further hypoglycemia. If BS remain good, she will send a Mychart message in 1 mth to request increase Ozempic . -Continue healthy eating habits - POCT glucose (manual entry) - POCT glycosylated hemoglobin (Hb A1C) - Semaglutide , 1 MG/DOSE, 4 MG/3ML SOPN; Inject 1 mg as directed once a week.  Dispense: 3 mL; Refill: 0 - Microalbumin / creatinine urine ratio  2. Long-term (current) use of injectable non-insulin  antidiabetic drugs 3. Hypoglycemia See #1 above - glucose chewable tablet 16 g  4. Hypertension associated with diabetes (HCC) Close to goal. Continue Norvasc  10 mg and Valsartan  - amLODipine  (NORVASC ) 10 MG tablet; Take 1 tablet (10 mg total) by mouth daily. Please schedule with Zelda for more refills!  Dispense: 90 tablet; Refill: 1  5. Hyperlipidemia associated with type 2 diabetes mellitus (HCC) Has not started atorvastatin . Discussed ineffectiveness of alternatives Red Rice Yeast in controlling cholesterol and no CV benefit. - Discuss CV benefits of statins in pts with DM. -pt agreed to start the Atorvastatin   6. Sciatica, right side - Consider using anti-inflammatory medications like Naprosyn or Advil with food for pain management. - Continue using a back brace and hot tub for relief. -Consider cutting back on work hrs    Patient  was given the opportunity to ask questions.  Patient verbalized understanding of the plan and was able to repeat key elements of the plan.   This documentation was completed using Paediatric nurse.  Any transcriptional errors are unintentional.  Orders Placed This Encounter  Procedures   Microalbumin / creatinine urine ratio   POCT glucose (manual entry)   POCT glycosylated hemoglobin (Hb A1C)     Requested Prescriptions   Signed Prescriptions Disp Refills   Semaglutide , 1 MG/DOSE, 4 MG/3ML SOPN 3 mL 0    Sig: Inject 1 mg as directed once a week.   amLODipine  (NORVASC ) 10 MG tablet 90 tablet 1    Sig: Take 1 tablet (10 mg total) by mouth daily. Please schedule with Zelda for more refills!    Return in about 4 months (around 12/19/2024).  Barnie Louder, MD, FACP     [1]  Current Outpatient Medications on File Prior to Visit  Medication Sig Dispense Refill   blood glucose meter kit and supplies Dispense based on patient and  insurance preference. Use up to four times daily as directed. (FOR ICD-10 E10.9, E11.9). 1 each 0   oxyCODONE  (OXY IR/ROXICODONE ) 5 MG immediate release tablet Take 1 tablet (5 mg total) by mouth every 6 (six) hours as needed for severe pain (pain score 7-10). 15 tablet 0   valsartan  (DIOVAN ) 40 MG tablet Take 1 tablet (40 mg total) by mouth daily. 90 tablet 3   atorvastatin  (LIPITOR) 10 MG tablet Take 1 tablet (10 mg total) by mouth daily. (Patient not taking: Reported on 08/20/2024) 90 tablet 1   Vitamin D , Ergocalciferol , (DRISDOL ) 1.25 MG (50000 UNIT) CAPS capsule Take 1 capsule (50,000 Units total) by mouth every 7 (seven) days. Please fill 90-day supply. (Patient not taking: Reported on 08/20/2024) 12 capsule 0   No current facility-administered medications on file prior to visit.  [2]  Allergies Allergen Reactions   Pineapple Other (See Comments)    Tongue burns   Lactose Intolerance (Gi)    Hydrochlorothiazide  Other (See  Comments)    Patient reports hair fell out

## 2024-08-21 ENCOUNTER — Encounter: Payer: Self-pay | Admitting: Internal Medicine

## 2024-08-22 LAB — MICROALBUMIN / CREATININE URINE RATIO
Creatinine, Urine: 312.1 mg/dL
Microalb/Creat Ratio: 10 mg/g{creat} (ref 0–29)
Microalbumin, Urine: 31.6 ug/mL

## 2024-08-23 ENCOUNTER — Ambulatory Visit: Payer: Self-pay | Admitting: Internal Medicine

## 2024-08-23 LAB — GLUCOSE, POCT (MANUAL RESULT ENTRY): POC Glucose: 89 mg/dL (ref 70–99)

## 2024-08-25 ENCOUNTER — Other Ambulatory Visit: Payer: Self-pay

## 2024-09-03 ENCOUNTER — Other Ambulatory Visit (HOSPITAL_COMMUNITY): Payer: Self-pay

## 2024-09-05 ENCOUNTER — Other Ambulatory Visit (HOSPITAL_COMMUNITY): Payer: Self-pay

## 2024-09-13 ENCOUNTER — Other Ambulatory Visit: Payer: Self-pay

## 2024-09-14 ENCOUNTER — Other Ambulatory Visit (HOSPITAL_COMMUNITY): Payer: Self-pay

## 2024-09-14 MED ORDER — ATORVASTATIN CALCIUM 10 MG PO TABS
10.0000 mg | ORAL_TABLET | Freq: Every day | ORAL | 1 refills | Status: AC
  Filled 2024-09-14: qty 90, 90d supply, fill #0

## 2024-09-14 NOTE — Telephone Encounter (Signed)
 Requested Prescriptions  Pending Prescriptions Disp Refills   atorvastatin  (LIPITOR) 10 MG tablet 90 tablet 1    Sig: Take 1 tablet (10 mg total) by mouth daily.     Cardiovascular:  Antilipid - Statins Failed - 09/14/2024 12:28 PM      Failed - Lipid Panel in normal range within the last 12 months    Cholesterol, Total  Date Value Ref Range Status  03/08/2024 207 (H) 100 - 199 mg/dL Final   LDL Chol Calc (NIH)  Date Value Ref Range Status  03/08/2024 140 (H) 0 - 99 mg/dL Final   HDL  Date Value Ref Range Status  03/08/2024 48 >39 mg/dL Final   Triglycerides  Date Value Ref Range Status  03/08/2024 108 0 - 149 mg/dL Final         Passed - Patient is not pregnant      Passed - Valid encounter within last 12 months    Recent Outpatient Visits           3 weeks ago Type 2 diabetes mellitus with morbid obesity (HCC)   [facility] Of [place]. [facility] Vicci Sober B, MD   6 months ago Type 2 diabetes mellitus with morbid obesity [facility])   [facility] Of [place]. [facility] Vicci Sober NOVAK, MD   1 year ago Hypertension associated with diabetes [facility])   [facility] Dept Of [place]. [facility] Theotis Haze ORN, NP   2 years ago Pap smear for cervical cancer screening   [facility] Dept Of [place]. [facility] Vicci Sober NOVAK, MD   2 years ago Annual physical exam   [facility] - A Dept Of . [facility] Vicci Sober NOVAK, MD

## 2024-09-20 ENCOUNTER — Telehealth: Admitting: Emergency Medicine

## 2024-09-20 DIAGNOSIS — J028 Acute pharyngitis due to other specified organisms: Secondary | ICD-10-CM

## 2024-09-20 DIAGNOSIS — B9689 Other specified bacterial agents as the cause of diseases classified elsewhere: Secondary | ICD-10-CM

## 2024-09-20 MED ORDER — AMOXICILLIN 500 MG PO CAPS: 500.0000 mg | ORAL_CAPSULE | Freq: Two times a day (BID) | ORAL | 0 refills | Status: AC

## 2024-09-20 NOTE — Progress Notes (Signed)
 We are sorry that you are not feeling well.  Here is how we plan to help!  Based on what you have shared with me it is likely that you have bacterial pharyngitis.  This is a bacterial infection in the back of the throat causing inflammation, and is treated with antibiotics.  I have prescribed Amoxicillin  500 mg twice a day for 10 days. For throat pain, we recommend over the counter oral pain relief medications such as acetaminophen  or aspirin , or anti-inflammatory medications such as ibuprofen or naproxen sodium. Topical treatments such as oral throat lozenges or sprays may be used as needed. Bacterial Pharyngitis infections are not as easily transmitted as other respiratory infections, however we still recommend that you avoid close contact with loved ones, especially the very young and elderly.  Remember to wash your hands thoroughly throughout the day as this is the number one way to prevent the spread of infection! We also recommend that you periodically wipe down door knobs and counters with disinfectant.   Home Care: Only take medications as instructed by your medical team. Complete the entire course of an antibiotic. Do not take these medications with alcohol. A steam or ultrasonic humidifier can help congestion.  You can place a towel over your head and breathe in the steam from hot water coming from a faucet. Avoid close contact with others, especially the very young and the elderly. Cover your mouth when you cough or sneeze. Always remember to wash your hands.  Get Help Right Away If: You develop worsening fever or sinus pain. You develop a severe head ache or visual changes. Your symptoms persist after you have completed your treatment plan.  Make sure you Understand these instructions. Will watch your condition. Will get help right away if you are not doing well or get worse.  Your e-visit answers were reviewed by a board certified advanced clinical practitioner to complete your  personal care plan.  Depending on the condition, your plan could have included both over the counter or prescription medications.  If there is a problem, please reply once you have received a response from your provider.  Your safety is important to us .  If you have drug allergies check your prescription carefully.    You can use MyChart to ask questions about todays visit, request a non-urgent call back, or ask for a work or school excuse for 24 hours related to this e-Visit. If it has been greater than 24 hours you will need to follow up with your provider, or enter a new e-Visit to address those concerns.  You will get an e-mail in the next two days asking about your experience.  I hope that your e-visit has been valuable and will speed your recovery. Thank you for using e-visits.   I have spent 5 minutes in review of e-visit questionnaire, review and updating patient chart, medical decision making and response to patient.   Delon CHRISTELLA Dickinson, PA-C

## 2024-12-24 ENCOUNTER — Ambulatory Visit: Payer: Self-pay | Admitting: Internal Medicine
